# Patient Record
Sex: Male | Born: 1945
Health system: Southern US, Community
[De-identification: ages and names within clinical notes are randomized; demographics above are authoritative.]

## PROBLEM LIST (undated history)

## (undated) DIAGNOSIS — I4891 Unspecified atrial fibrillation: Secondary | ICD-10-CM

## (undated) DIAGNOSIS — Z7901 Long term (current) use of anticoagulants: Secondary | ICD-10-CM

## (undated) DIAGNOSIS — I350 Nonrheumatic aortic (valve) stenosis: Secondary | ICD-10-CM

## (undated) DIAGNOSIS — I10 Essential (primary) hypertension: Secondary | ICD-10-CM

## (undated) DIAGNOSIS — R011 Cardiac murmur, unspecified: Secondary | ICD-10-CM

## (undated) DIAGNOSIS — E785 Hyperlipidemia, unspecified: Secondary | ICD-10-CM

## (undated) DIAGNOSIS — C419 Malignant neoplasm of bone and articular cartilage, unspecified: Secondary | ICD-10-CM

## (undated) DIAGNOSIS — R9389 Abnormal findings on diagnostic imaging of other specified body structures: Secondary | ICD-10-CM

## (undated) DIAGNOSIS — J302 Other seasonal allergic rhinitis: Secondary | ICD-10-CM

## (undated) DIAGNOSIS — N529 Male erectile dysfunction, unspecified: Secondary | ICD-10-CM

## (undated) DIAGNOSIS — I341 Nonrheumatic mitral (valve) prolapse: Secondary | ICD-10-CM

## (undated) DIAGNOSIS — I480 Paroxysmal atrial fibrillation: Secondary | ICD-10-CM

## (undated) DIAGNOSIS — I359 Nonrheumatic aortic valve disorder, unspecified: Secondary | ICD-10-CM

## (undated) DIAGNOSIS — E7800 Pure hypercholesterolemia, unspecified: Secondary | ICD-10-CM

## (undated) HISTORY — DX: Long term (current) use of anticoagulants: Z79.01

## (undated) HISTORY — DX: Malignant neoplasm of bone and articular cartilage, unspecified: C41.9

## (undated) HISTORY — DX: Nonrheumatic aortic valve disorder, unspecified: I35.9

## (undated) HISTORY — DX: Essential (primary) hypertension: I10

## (undated) HISTORY — DX: Male erectile dysfunction, unspecified: N52.9

## (undated) HISTORY — DX: Cardiac murmur, unspecified: R01.1

## (undated) HISTORY — DX: Unspecified atrial fibrillation: I48.91

## (undated) HISTORY — DX: Hyperlipidemia, unspecified: E78.5

## (undated) HISTORY — DX: Paroxysmal atrial fibrillation: I48.0

## (undated) HISTORY — DX: Abnormal findings on diagnostic imaging of other specified body structures: R93.89

## (undated) HISTORY — DX: Nonrheumatic mitral (valve) prolapse: I34.1

## (undated) HISTORY — DX: Other seasonal allergic rhinitis: J30.2

## (undated) HISTORY — DX: Pure hypercholesterolemia, unspecified: E78.00

---

## 1974-07-12 DIAGNOSIS — C419 Malignant neoplasm of bone and articular cartilage, unspecified: Secondary | ICD-10-CM

## 1974-07-12 HISTORY — PX: MANDIBLE SURGERY: SHX707

## 1974-07-12 HISTORY — DX: Malignant neoplasm of bone and articular cartilage, unspecified: C41.9

## 1985-07-12 HISTORY — PX: MANDIBLE SURGERY: SHX707

## 2002-07-18 HISTORY — PX: TRANSTHORACIC ECHOCARDIOGRAM: SHX275

## 2006-07-12 HISTORY — PX: EYELID LACERATION REPAIR: SHX5333

## 2006-11-08 ENCOUNTER — Ambulatory Visit: Payer: Self-pay | Admitting: Internal Medicine

## 2006-11-21 ENCOUNTER — Encounter (INDEPENDENT_AMBULATORY_CARE_PROVIDER_SITE_OTHER): Payer: Self-pay | Admitting: Specialist

## 2006-11-21 ENCOUNTER — Ambulatory Visit: Payer: Self-pay | Admitting: Internal Medicine

## 2010-11-24 NOTE — Assessment & Plan Note (Signed)
[facility]                         GASTROENTEROLOGY OFFICE NOTE   NAME:Brian Klein, Brian Klein                        MRN:          [id number]  DATE:11/08/2006                            DOB:          [birth year]    REFERRING PHYSICIAN:  Loraine Leriche A. Perini, M.D.   REASON FOR CONSULTATION:  Iron deficiency.   HISTORY:  This is a pleasant 65 year old white male with a remote  history of osteogenic sarcoma of the jaw bone who was referred through  the courtesy of Dr. Waynard Edwards regarding iron deficiency.  The patient was  evaluated in this facility on one occasion in July of 2003 when he  underwent complete colonoscopy for the purposes of screening.  He was  found to have sigmoid diverticulosis, as well as several ascending colon  polyps which were removed.  There was no evidence of adenomatous change.  Follow up in 5 years recommended.  The patient undergoes routine blood  work with Dr. Waynard Edwards as part of his regular checkups.  Recently, he may  have been a bit anemic, though I do not have a hemoglobin.  He did  undergo iron studies, which revealed a low ferritin at 23.7.  Iron  saturation was normal at 37%.  His B-12 level, as well as his folate  levels, were normal.  He is now referred.  The patient denies any active  GI symptoms.  He states he has had stool testing for occult blood, which  has been negative.  He denies melena or hematochezia specifically.  He  does take a baby aspirin per day, but no nonsteroidal anti-inflammatory  drugs.  He denies a history of blood donation in recent decades.  He is  accompanied by his wife, who seems to think he has a history of  borderline low iron.   PAST MEDICAL HISTORY:  1. Osteogenic sarcoma of the jaw bone in 1976.  The patient underwent      surgery, as well as adjuvant treatment.  Subsequent reconstruction      of the jawbone in 1980.  2. Hyperlipidemia.  3. Aortic stenosis, being observed.   ALLERGIES:  No known  drug allergies.   CURRENT MEDICATIONS:  1. Vytorin 40 mg daily.  2. Niacin 500 mg daily.  3. Aspirin 81 mg daily.  4. Iron sulfate 37.5 mg daily (recently started).  5. Fish oil 2 gm daily.   FAMILY HISTORY:  No family history of gastrointestinal malignancy.   SOCIAL HISTORY:  The patient is married with 2 sons.  He is accompanied  by his wife.  He is a retired Advertising copywriter.  He  received his Master's degree at [employer]  He does not smoke or use alcohol.   REVIEW OF SYSTEMS:  Per diagnostic evaluation form.   PHYSICAL EXAMINATION:  GENERAL:  A well-appearing male in no acute  distress.  VITAL SIGNS:  His blood pressure is 108/66, heart rate is 68 and  regular, weight is 159.8 pounds.  He is 5 feet, 7.5 inches in height.  HEENT:  Sclerae are anicteric.  Conjunctivae are quite pink.  Oral  mucosa is intact.  No adenopathy.  Deformity of the right jaw area from  prior surgery.  LUNGS:  Clear.  HEART:  Regular.  ABDOMEN:  Soft with a small ventral hernia.  Good bowel sounds heard.  No organomegaly.  EXTREMITIES:  Without edema.   IMPRESSION:  This is a 65 year old gentleman with iron-deficiency  anemia.  Negative GI review of systems.  Colonoscopy 5 years ago as  discussed above.   RECOMMENDATIONS:  Colonoscopy and upper endoscopy to exclude  gastrointestinal mucosal lesions to account for iron-deficiency.  The  nature of the procedures, as well as the risks, benefits, and  alternatives were reviewed.  He understood and agreed to proceed.  Also  consider biopsies of the small bowel mucosa to exclude sprue.     Wilhemina Bonito. Marina Goodell, MD  Electronically Signed    JNP/MedQ  DD: 11/08/2006  DT: 11/08/2006  Job #: [id number]   cc:   Loraine Leriche A. Perini, M.D.

## 2010-11-30 ENCOUNTER — Telehealth: Payer: Self-pay | Admitting: Cardiology

## 2010-11-30 NOTE — Telephone Encounter (Signed)
FYI- PATIENT CALLED WANTED TO KNOW ABOUT HIS APPOINTMENT 1 YROV. I SCHEDULED IT FOR June 6. [facility] HE ALSO NEEDS A ECHO.

## 2010-12-01 ENCOUNTER — Other Ambulatory Visit: Payer: Self-pay | Admitting: *Deleted

## 2010-12-01 DIAGNOSIS — I351 Nonrheumatic aortic (valve) insufficiency: Secondary | ICD-10-CM

## 2010-12-01 DIAGNOSIS — I350 Nonrheumatic aortic (valve) stenosis: Secondary | ICD-10-CM

## 2010-12-01 NOTE — Telephone Encounter (Signed)
Scheduled echo for 6/4

## 2010-12-14 ENCOUNTER — Ambulatory Visit (HOSPITAL_COMMUNITY): Payer: Medicare Other | Attending: Cardiology | Admitting: Radiology

## 2010-12-14 DIAGNOSIS — I359 Nonrheumatic aortic valve disorder, unspecified: Secondary | ICD-10-CM

## 2010-12-14 DIAGNOSIS — I350 Nonrheumatic aortic (valve) stenosis: Secondary | ICD-10-CM

## 2010-12-14 DIAGNOSIS — I379 Nonrheumatic pulmonary valve disorder, unspecified: Secondary | ICD-10-CM | POA: Insufficient documentation

## 2010-12-14 DIAGNOSIS — I079 Rheumatic tricuspid valve disease, unspecified: Secondary | ICD-10-CM | POA: Insufficient documentation

## 2010-12-14 DIAGNOSIS — I351 Nonrheumatic aortic (valve) insufficiency: Secondary | ICD-10-CM

## 2010-12-14 DIAGNOSIS — I080 Rheumatic disorders of both mitral and aortic valves: Secondary | ICD-10-CM | POA: Insufficient documentation

## 2010-12-15 ENCOUNTER — Encounter: Payer: Self-pay | Admitting: Cardiology

## 2010-12-16 ENCOUNTER — Ambulatory Visit (INDEPENDENT_AMBULATORY_CARE_PROVIDER_SITE_OTHER): Payer: Medicare Other | Admitting: Cardiology

## 2010-12-16 ENCOUNTER — Encounter: Payer: Self-pay | Admitting: Cardiology

## 2010-12-16 VITALS — BP 130/80 | HR 56 | Ht 67.0 in | Wt 155.4 lb

## 2010-12-16 DIAGNOSIS — E7800 Pure hypercholesterolemia, unspecified: Secondary | ICD-10-CM

## 2010-12-16 DIAGNOSIS — I359 Nonrheumatic aortic valve disorder, unspecified: Secondary | ICD-10-CM

## 2010-12-16 DIAGNOSIS — I350 Nonrheumatic aortic (valve) stenosis: Secondary | ICD-10-CM

## 2010-12-16 DIAGNOSIS — Q2381 Bicuspid aortic valve: Secondary | ICD-10-CM | POA: Insufficient documentation

## 2010-12-16 DIAGNOSIS — Q231 Congenital insufficiency of aortic valve: Secondary | ICD-10-CM | POA: Insufficient documentation

## 2010-12-16 HISTORY — DX: Pure hypercholesterolemia, unspecified: E78.00

## 2010-12-16 NOTE — Assessment & Plan Note (Signed)
The patient has a history of bicuspid aortic valve with mild aortic stenosis.  Since last visit he has not been experiencing any chest pain or shortness of breath.  He's had no dizziness or syncope.  He exercises regularly.  He goes to the gym for 5 times a week for an hour each time.  He does aerobic workout as well as works out on machines.

## 2010-12-16 NOTE — Assessment & Plan Note (Signed)
The patient has a history of hypercholesterolemia.  He is on Vytorin.  He has lipids are responding nicely to Vytorin according to the patient.  He has not had any side effects from her statin therapy.

## 2010-12-16 NOTE — Progress Notes (Signed)
Brian Klein Date of Birth:  28-Apr-1946 [facility] / [facility] [address]   [address] [place], [place]  [address] [phone]           Fax   [phone]  HPI: This pleasant 65 year old gentleman is seen for a one-year followup office visit.  He has a history of bicuspid aortic valve with mild aortic stenosis.  He has not been experiencing any cardinal symptoms of symptomatic aortic stenosis.  In particular he denies exertional chest pain, shortness of breath, or exertional dizziness or syncope.  [facility] He works out on a treadmill and the elliptical as well as using the machines.  He had a recent echocardiogram which showed that his Mean systolic gradient across the aortic valve was 11 mm mercury. This is consistent with mild aortic stenosis. Current Outpatient Prescriptions  Medication Sig Dispense Refill  . aspirin 81 MG tablet Take 81 mg by mouth every other day.        . Cholecalciferol (VITAMIN D) 2000 UNITS tablet Take 2,000 Units by mouth daily.        Marland Kitchen ezetimibe-simvastatin (VYTORIN) 10-40 MG per tablet Take 1 tablet by mouth at bedtime.        . fexofenadine (ALLEGRA) 60 MG tablet Take 60 mg by mouth as needed.        . fish oil-omega-3 fatty acids 1000 MG capsule Take by mouth daily.        . IRON PO Take by mouth once a week.        . niacin (NIASPAN) 500 MG CR tablet Take 500 mg by mouth every other day.          No Known Allergies  Patient Active Problem List  Diagnoses  . Bicuspid aortic valve  . Hypercholesterolemia    History  Smoking status  . Never Smoker   Smokeless tobacco  . Never Used    History  Alcohol Use No    Family History  Problem Relation Age of Onset  . Stroke Mother   . Heart attack Father   . Hypertension Father   . Hypertension Sister     Review of Systems: The patient denies any heat or cold intolerance.  No weight gain or weight loss.  The patient denies headaches or  blurry vision.  There is no cough or sputum production.  The patient denies dizziness.  There is no hematuria or hematochezia.  The patient denies any muscle aches or arthritis.  The patient denies any rash.  The patient denies frequent falling or instability.  There is no history of depression or anxiety.  All other systems were reviewed and are negative.   Physical Exam: Filed Vitals:   12/16/10 1344  BP: 130/80  Pulse: 56  The general appearance reveals a well-developed well-nourished gentleman in no distress.Pupils equal and reactive.   Extraocular Movements are full.  There is no scleral icterus.  The mouth and pharynx are normal.  The neck is supple.  The carotids reveal no bruits.  The jugular venous pressure is normal.  The thyroid is not enlarged.  There is no lymphadenopathy.  The patient has had previous reconstructive surgery on his jaw.The chest is clear to percussion and auscultation. There are no rales or rhonchi. Expansion of the chest is symmetrical.  The heart reveals a grade 2/6 systolic ejection murmur at the base.  No diastolic murmur.  No gallop or rub.The abdomen is soft and  nontender. Bowel sounds are normal. The liver and spleen are not enlarged. There Are no abdominal masses. There are no bruits.  Normal extremity no phlebitis or edema.Strength is normal and symmetrical in all extremities.  There is no lateralizing weakness.  There are no sensory deficits.  EKG today shows sinus bradycardia and is otherwise normal    Assessment / Plan: Continue present meds.  No new meds were prescribed.  Recheck in one year for office visit and EKG.

## 2011-11-04 ENCOUNTER — Encounter: Payer: Self-pay | Admitting: Internal Medicine

## 2011-11-16 ENCOUNTER — Encounter: Payer: Self-pay | Admitting: Internal Medicine

## 2011-12-17 ENCOUNTER — Telehealth: Payer: Self-pay | Admitting: Internal Medicine

## 2011-12-17 ENCOUNTER — Telehealth: Payer: Self-pay | Admitting: *Deleted

## 2011-12-17 ENCOUNTER — Encounter: Payer: Self-pay | Admitting: Cardiology

## 2011-12-17 ENCOUNTER — Ambulatory Visit (INDEPENDENT_AMBULATORY_CARE_PROVIDER_SITE_OTHER): Payer: Medicare Other | Admitting: Cardiology

## 2011-12-17 VITALS — BP 114/78 | HR 70 | Ht 67.0 in | Wt 154.0 lb

## 2011-12-17 DIAGNOSIS — I359 Nonrheumatic aortic valve disorder, unspecified: Secondary | ICD-10-CM

## 2011-12-17 DIAGNOSIS — E7800 Pure hypercholesterolemia, unspecified: Secondary | ICD-10-CM

## 2011-12-17 DIAGNOSIS — I4821 Permanent atrial fibrillation: Secondary | ICD-10-CM | POA: Insufficient documentation

## 2011-12-17 DIAGNOSIS — I4891 Unspecified atrial fibrillation: Secondary | ICD-10-CM

## 2011-12-17 DIAGNOSIS — Q231 Congenital insufficiency of aortic valve: Secondary | ICD-10-CM

## 2011-12-17 LAB — HEPATIC FUNCTION PANEL
ALT: 20 U/L (ref 0–53)
AST: 25 U/L (ref 0–37)
Albumin: 3.8 g/dL (ref 3.5–5.2)
Alkaline Phosphatase: 58 U/L (ref 39–117)
Total Protein: 6.3 g/dL (ref 6.0–8.3)

## 2011-12-17 LAB — CBC WITH DIFFERENTIAL/PLATELET
Basophils Absolute: 0 10*3/uL (ref 0.0–0.1)
HCT: 38.9 % — ABNORMAL LOW (ref 39.0–52.0)
Lymphs Abs: 2.1 10*3/uL (ref 0.7–4.0)
MCV: 83.2 fl (ref 78.0–100.0)
Monocytes Absolute: 0.4 10*3/uL (ref 0.1–1.0)
Neutrophils Relative %: 46.5 % (ref 43.0–77.0)
Platelets: 192 10*3/uL (ref 150.0–400.0)
RDW: 13.3 % (ref 11.5–14.6)
WBC: 5.1 10*3/uL (ref 4.5–10.5)

## 2011-12-17 LAB — BASIC METABOLIC PANEL
BUN: 16 mg/dL (ref 6–23)
Chloride: 106 mEq/L (ref 96–112)
Creatinine, Ser: 1 mg/dL (ref 0.4–1.5)
GFR: 80.75 mL/min (ref >60.00)
Glucose, Bld: 94 mg/dL (ref 70–99)
Potassium: 4.1 mEq/L (ref 3.5–5.1)

## 2011-12-17 LAB — TSH: TSH: 2.28 u[IU]/mL (ref 0.35–5.50)

## 2011-12-17 MED ORDER — RIVAROXABAN 20 MG PO TABS: 20.0000 mg | ORAL_TABLET | Freq: Every day | ORAL | Status: DC

## 2011-12-17 NOTE — Telephone Encounter (Signed)
Pt was seen by his cardiologist today and placed on Xarelto for A-Fib. Pt has recall colon scheduled in July. Pt states his cardiologist instructed him to contact Dr. Marina Goodell to see if he wanted to cancel the colon or what he wanted to do. Dr. Marina Goodell please advise.

## 2011-12-17 NOTE — Assessment & Plan Note (Signed)
His last echocardiogram was done on 12/14/10, a year ago, and at that time showed only mild aortic stenosis with a peak aortic valve gradient of 19 and a mean gradient of 11

## 2011-12-17 NOTE — Patient Instructions (Signed)
Will obtain labs today and call you with the results  Your physician has requested that you have an echocardiogram. Echocardiography is a painless test that uses sound waves to create images of your heart. It provides your doctor with information about the size and shape of your heart and how well your heart's chambers and valves are working. This procedure takes approximately one hour. There are no restrictions for this procedure.  Your physician recommends that you schedule a follow-up appointment in: 3-4 weeks  STOP ASPIRIN  Will call you today with your labs and send Rx for Xarelto base on the results  Atrial Fibrillation Your caregiver has diagnosed you with atrial fibrillation (AFib). The heart normally beats very regularly; AFib is a type of irregular heartbeat. The heart rate may be faster or slower than normal. This can prevent your heart from pumping as well as it should. AFib can be constant (chronic) or intermittent (paroxysmal). CAUSES  Atrial fibrillation may be caused by:  Heart disease, including heart attack, coronary artery disease, heart failure, diseases of the heart valves, and others.   Blood clot in the lungs (pulmonary embolism).   Pneumonia or other infections.   Chronic lung disease.   Thyroid disease.   Toxins. These include alcohol, some medications (such as decongestant medications or diet pills), and caffeine.  In some people, no cause for AFib can be found. This is referred to as Lone Atrial Fibrillation. SYMPTOMS   Palpitations or a fluttering in your chest.   A vague sense of chest discomfort.   Shortness of breath.   Sudden onset of lightheadedness or weakness.  Sometimes, the first sign of AFib can be a complication of the condition. This could be a stroke or heart failure. DIAGNOSIS  Your description of your condition may make your caregiver suspicious of atrial fibrillation. Your caregiver will examine your pulse to determine if fibrillation  is present. An EKG (electrocardiogram) will confirm the diagnosis. Further testing may help determine what caused you to have atrial fibrillation. This may include chest x-ray, echocardiogram, blood tests, or CT scans. PREVENTION  If you have previously had atrial fibrillation, your caregiver may advise you to avoid substances known to cause the condition (such as stimulant medications, and possibly caffeine or alcohol). You may be advised to use medications to prevent recurrence. Proper treatment of any underlying condition is important to help prevent recurrence. PROGNOSIS  Atrial fibrillation does tend to become a chronic condition over time. It can cause significant complications (see below). Atrial fibrillation is not usually immediately life-threatening, but it can shorten your life expectancy. This seems to be worse in women. If you have lone atrial fibrillation and are under 52 years old, the risk of complications is very low, and life expectancy is not shortened. RISKS AND COMPLICATIONS  Complications of atrial fibrillation can include stroke, chest pain, and heart failure. Your caregiver will recommend treatments for the atrial fibrillation, as well as for any underlying conditions, to help minimize risk of complications. TREATMENT  Treatment for AFib is divided into several categories:  Treatment of any underlying condition.   Converting you out of AFib into a regular (sinus) rhythm.   Controlling rapid heart rate.   Prevention of blood clots and stroke.  Medications and procedures are available to convert your atrial fibrillation to sinus rhythm. However, recent studies have shown that this may not offer you any advantage, and cardiac experts are continuing research and debate on this topic. More important is controlling your rapid  heartbeat. The rapid heartbeat causes more symptoms, and places strain on your heart. Your caregiver will advise you on the use of medications that can  control your heart rate. Atrial fibrillation is a strong stroke risk. You can lessen this risk by taking blood thinning medications such as Coumadin (warfarin), or sometimes aspirin. These medications need close monitoring by your caregiver. Over-medication can cause bleeding. Too little medication may not protect against stroke. HOME CARE INSTRUCTIONS   If your caregiver prescribed medicine to make your heartbeat more normally, take as directed.   If blood thinners were prescribed by your caregiver, take EXACTLY as directed.   Perform blood tests EXACTLY as directed.   Quit smoking. Smoking increases your cardiac and lung (pulmonary) risks.   DO NOT drink alcohol.   DO NOT drink caffeinated drinks (e.g. coffee, soda, chocolate, and leaf teas). You may drink decaffeinated coffee, soda or tea.   If you are overweight, you should choose a reduced calorie diet to lose weight. Please see a registered dietitian if you need more information about healthy weight loss. DO NOT USE DIET PILLS as they may aggravate heart problems.   If you have other heart problems that are causing AFib, you may need to eat a low salt, fat, and cholesterol diet. Your caregiver will tell you if this is necessary.   Exercise every day to improve your physical fitness. Stay active unless advised otherwise.   If your caregiver has given you a follow-up appointment, it is very important to keep that appointment. Not keeping the appointment could result in heart failure or stroke. If there is any problem keeping the appointment, you must call back to this facility for assistance.  SEEK MEDICAL CARE IF:  You notice a change in the rate, rhythm or strength of your heartbeat.   You develop an infection or any other change in your overall health status.  SEEK IMMEDIATE MEDICAL CARE IF:   You develop chest pain, abdominal pain, sweating, weakness or feel sick to your stomach (nausea).   You develop shortness of breath.    You develop swollen feet and ankles.   You develop dizziness, numbness, or weakness of your face or limbs, or any change in vision or speech.  MAKE SURE YOU:   Understand these instructions.   Will watch your condition.   Will get help right away if you are not doing well or get worse.  Document Released: 06/28/2005 Document Revised: 06/17/2011 Document Reviewed: 01/31/2008 [facility] Patient Information 2012 [place], [place].

## 2011-12-17 NOTE — Assessment & Plan Note (Signed)
His EKG which had been ordered as a routine for today's one-year visit shows that he is in atrial fibrillation with a controlled ventricular response of 70.  The atrial fibrillation was a surprise.  Going back in his history it may have been present for about a month considering that is when his exercise stamina seemed to change.  He has been taking a daily aspirin.  He has had no TIA symptoms.  He has had no chest pain or exertional dyspnea.

## 2011-12-17 NOTE — Progress Notes (Signed)
Brian Klein Date of Birth:  05-29-1946 [facility] [address] [place], [place]  [address] [phone]         Fax   [phone]  History of Present Illness: This pleasant 66 year old gentleman is seen back for a one-year followup office visit.  He has a history of aortic valve disease with bicuspid aortic valve and mild aortic stenosis.  He also has a past history of high cholesterol.  Generally he has been feeling very well.  About a month ago he began to notice less stamina when he would try to go for an outside jogging .  When he would work out in the gym he noticed no real change in his stamina.  He has not been aware of his pulse rate being irregular.  He's had no chest pain.  He does not have any past history of arrhythmia or atrial fibrillation.  He did have seasonal allergies earlier this spring and had taken him over-the-counter medications briefly.  Current Outpatient Prescriptions  Medication Sig Dispense Refill  . aspirin 81 MG tablet Take 81 mg by mouth every other day.        . Cholecalciferol (VITAMIN D) 2000 UNITS tablet Take 2,000 Units by mouth daily.        . Coenzyme Q10 (CO Q 10) 100 MG CAPS Take by mouth daily.      Marland Kitchen ezetimibe-simvastatin (VYTORIN) 10-40 MG per tablet Take 1 tablet by mouth at bedtime.        . fexofenadine (ALLEGRA) 60 MG tablet Take 60 mg by mouth as needed.        . fish oil-omega-3 fatty acids 1000 MG capsule Take 1 g by mouth 2 (two) times daily.       . IRON PO Take by mouth once a week.          No Known Allergies  Patient Active Problem List  Diagnoses  . Bicuspid aortic valve  . Hypercholesterolemia  . Atrial fibrillation    History  Smoking status  . Never Smoker   Smokeless tobacco  . Never Used    History  Alcohol Use No    Family History  Problem Relation Age of Onset  . Stroke Mother   . Heart attack Father   . Hypertension Father   . Hypertension Sister     Review of  Systems: Constitutional: no fever chills diaphoresis or fatigue or change in weight.  Head and neck: no hearing loss, no epistaxis, no photophobia or visual disturbance. Respiratory: No cough, shortness of breath or wheezing. Cardiovascular: No chest pain peripheral edema, palpitations. Gastrointestinal: No abdominal distention, no abdominal pain, no change in bowel habits hematochezia or melena. Genitourinary: No dysuria, no frequency, no urgency, no nocturia. Musculoskeletal:No arthralgias, no back pain, no gait disturbance or myalgias. Neurological: No dizziness, no headaches, no numbness, no seizures, no syncope, no weakness, no tremors. Hematologic: No lymphadenopathy, no easy bruising. Psychiatric: No confusion, no hallucinations, no sleep disturbance.    Physical Exam: Filed Vitals:   12/17/11 0932  BP: 114/78  Pulse: 70   the general appearance reveals a well-developed well-nourished gentleman in no distress.  He has facial asymmetry secondary to removal of the right half of his jaw and 1976 for an osteogenic sarcoma with subsequent plastic surgery using 2 of his left ribs and part of his left iliac crest to create a new jaw bone.Pupils equal and reactive.   Extraocular Movements are full.  There is no  scleral icterus.  The mouth and pharynx are normal.  The neck is supple.  The carotids reveal no bruits.  The jugular venous pressure is normal.  The thyroid is not enlarged.  There is no lymphadenopathy.  The chest is clear to percussion and auscultation. There are no rales or rhonchi. Expansion of the chest is symmetrical.  The heart reveals a soft systolic ejection murmur at the base and the heart rhythm is irregularly irregular.The abdomen is soft and nontender. Bowel sounds are normal. The liver and spleen are not enlarged. There Are no abdominal masses. There are no bruits.  The pedal pulses are good.  There is no phlebitis or edema.  There is no cyanosis or clubbing. Strength  is normal and symmetrical in all extremities.  There is no lateralizing weakness.  There are no sensory deficits.  The skin is warm and dry.  There is no rash.  EKG shows atrial fibrillation which is new and his ventricular response  is controlled   Assessment / Plan: Atrial fibrillation probably present for the past month or so.  Plan: Start Xarelto 20 mg once daily with supper for anticoagulation for his atrial fib.  After he has been adequately anticoagulated for 3-4 weeks we will consider outpatient cardioversion.  He will stop his daily aspirin while starting Xarelto.  He will return for an echocardiogram.  We checked CBC TSH basal metabolic panel and hepatic function panel today all of which were normal.

## 2011-12-17 NOTE — Telephone Encounter (Signed)
His procedure reports are not in EPIC. Please pull his chart for my review. Also, you should cancel his pre-visit and tentative procedure dates and have him make an O.V. with me to discuss options.

## 2011-12-17 NOTE — Telephone Encounter (Signed)
Chart ordered for Dr. Marina Goodell. Pt scheduled to see Dr. Marina Goodell 01/19/12@2 :30pm. Appts cancelled for previsit and colon.

## 2011-12-17 NOTE — Assessment & Plan Note (Signed)
Patient has a history of hypercholesterolemia monitored by Dr. Waynard Edwards.  He has not been having any myalgias from the statin.

## 2011-12-17 NOTE — Telephone Encounter (Signed)
Advised of labs and will start Xarelto 20 mg daily

## 2011-12-17 NOTE — Telephone Encounter (Signed)
Message copied by Burnell Blanks on Fri Dec 17, 2011  1:58 PM ------      Message from: Cassell Clement      Created: Fri Dec 17, 2011  1:42 PM       Please report.  The blood work drawn today is all normal.  He is not anemic.  His kidney function and liver function is normal.  Okay to start several toe 20 mg each day with the evening meal.

## 2011-12-20 NOTE — Telephone Encounter (Signed)
Chart on Dr. Perry's desk for review. 

## 2011-12-21 ENCOUNTER — Other Ambulatory Visit (HOSPITAL_COMMUNITY): Payer: Self-pay | Admitting: Cardiology

## 2011-12-21 DIAGNOSIS — Q231 Congenital insufficiency of aortic valve: Secondary | ICD-10-CM

## 2011-12-22 ENCOUNTER — Ambulatory Visit (HOSPITAL_COMMUNITY): Payer: Medicare Other | Attending: Cardiovascular Disease | Admitting: Radiology

## 2011-12-22 DIAGNOSIS — Q231 Congenital insufficiency of aortic valve: Secondary | ICD-10-CM

## 2011-12-22 DIAGNOSIS — I4891 Unspecified atrial fibrillation: Secondary | ICD-10-CM | POA: Insufficient documentation

## 2011-12-22 DIAGNOSIS — I359 Nonrheumatic aortic valve disorder, unspecified: Secondary | ICD-10-CM | POA: Insufficient documentation

## 2011-12-22 DIAGNOSIS — E7800 Pure hypercholesterolemia, unspecified: Secondary | ICD-10-CM | POA: Insufficient documentation

## 2011-12-22 NOTE — Progress Notes (Signed)
Echocardiogram performed.  

## 2011-12-24 ENCOUNTER — Telehealth: Payer: Self-pay | Admitting: *Deleted

## 2011-12-24 NOTE — Telephone Encounter (Signed)
Message copied by Burnell Blanks on Fri Dec 24, 2011  6:28 PM ------      Message from: Cassell Clement      Created: Wed Dec 22, 2011  3:12 PM       Please report.  Echo is stable.  AS and AI are still mild. LA upper normal size and no clots seen.

## 2011-12-24 NOTE — Telephone Encounter (Signed)
Advised wife of results.  

## 2012-01-04 ENCOUNTER — Ambulatory Visit (INDEPENDENT_AMBULATORY_CARE_PROVIDER_SITE_OTHER): Payer: Medicare Other | Admitting: Cardiology

## 2012-01-04 ENCOUNTER — Encounter: Payer: Self-pay | Admitting: Cardiology

## 2012-01-04 VITALS — BP 122/74 | HR 65 | Ht 67.0 in | Wt 153.0 lb

## 2012-01-04 DIAGNOSIS — Q231 Congenital insufficiency of aortic valve: Secondary | ICD-10-CM

## 2012-01-04 DIAGNOSIS — I480 Paroxysmal atrial fibrillation: Secondary | ICD-10-CM

## 2012-01-04 DIAGNOSIS — I4891 Unspecified atrial fibrillation: Secondary | ICD-10-CM

## 2012-01-04 MED ORDER — METOPROLOL SUCCINATE ER 25 MG PO TB24: 25.0000 mg | ORAL_TABLET | Freq: Every day | ORAL | Status: DC

## 2012-01-04 NOTE — Assessment & Plan Note (Signed)
The patient has noted that some days his pulse is regular and other days it is irregular.  EKG today confirms that he is back in normal sinus rhythm but with frequent PACs.

## 2012-01-04 NOTE — Assessment & Plan Note (Signed)
His recent echocardiogram showed normal left ventricular function and showed no significant worsening of his known aortic stenosis and aortic insufficiency

## 2012-01-04 NOTE — Patient Instructions (Addendum)
Your physician has recommended you make the following change in your medication: Start Metoprolol XL 25mg  daily  Your physician recommends that you schedule a follow-up appointment in: 2 months with an ekg

## 2012-01-04 NOTE — Progress Notes (Signed)
Brian Klein Date of Birth:  [birth year] [facility] [address] [place], [place]  [address] [phone]  Fax   [phone]  HPI: This pleasant 66 year old gentleman is seen for a followup office visit.  He was last seen on 12/17/11 at which time he was found to be in atrial fibrillation.  He had noticed an irregular pulse and less stamina several weeks prior to that visit.  He has been on Xarelto 20 mg daily since 12/17/11.  He has not been experiencing any bleeding problems from the Xarelto  Current Outpatient Prescriptions  Medication Sig Dispense Refill  . Cholecalciferol (VITAMIN D) 2000 UNITS tablet Take 2,000 Units by mouth daily.        . Coenzyme Q10 (CO Q 10) 100 MG CAPS Take by mouth daily.      Marland Kitchen ezetimibe-simvastatin (VYTORIN) 10-40 MG per tablet Take 1 tablet by mouth at bedtime.        . fexofenadine (ALLEGRA) 60 MG tablet Take 60 mg by mouth as needed.        . fish oil-omega-3 fatty acids 1000 MG capsule Take 1 g by mouth 2 (two) times daily.       . Rivaroxaban (XARELTO) 20 MG TABS Take 20 mg by mouth daily.  30 tablet  5  . metoprolol succinate (TOPROL-XL) 25 MG 24 hr tablet Take 1 tablet (25 mg total) by mouth daily. Take with or immediately following a meal.  30 tablet  5    No Known Allergies  Patient Active Problem List  Diagnosis  . Bicuspid aortic valve  . Hypercholesterolemia  . Atrial fibrillation    History  Smoking status  . Never Smoker   Smokeless tobacco  . Never Used    History  Alcohol Use No    Family History  Problem Relation Age of Onset  . Stroke Mother   . Heart attack Father   . Hypertension Father   . Hypertension Sister     Review of Systems: The patient denies any heat or cold intolerance.  No weight gain or weight loss.  The patient denies headaches or blurry vision.  There is no cough or sputum production.  The patient denies dizziness.  There is no hematuria or hematochezia.  The patient denies  any muscle aches or arthritis.  The patient denies any rash.  The patient denies frequent falling or instability.  There is no history of depression or anxiety.  All other systems were reviewed and are negative.   Physical Exam: Filed Vitals:   01/04/12 1123  BP: 122/74  Pulse: 65   the general appearance reveals a well-developed well-nourished gentleman in no distress.Pupils equal and reactive.   Extraocular Movements are full.  There is no scleral icterus.  The mouth and pharynx are normal.  The neck is supple.  The carotids reveal no bruits.  The jugular venous pressure is normal.  The thyroid is not enlarged.  There is no lymphadenopathy.  There is evidence of a previous fracture surgery on his jaw.The chest is clear to percussion and auscultation. There are no rales or rhonchi. Expansion of the chest is symmetrical.  The heart reveals a grade 2/6 systolic ejection murmur at the base.  The pulse is irregular and EKG shows normal sinus rhythm with frequent PACs.The abdomen is soft and nontender. Bowel sounds are normal. The liver and spleen are not enlarged. There Are no abdominal masses. There are no bruits.  The pedal pulses are good.  There is no phlebitis or edema.  There is no cyanosis or clubbing.  EKG shows sinus rhythm with PACs and otherwise normal EKG     Assessment / Plan: The patient is to continue Xarelto for his paroxysmal atrial fibrillation.  I will and metoprolol succinate 25 mg one daily to try to decrease the frequency of his premature atrial beats and hold him in sinus rhythm.  He may increase activity as tolerated.  Recheck in 2 months for followup office visit and EKG

## 2012-01-19 ENCOUNTER — Ambulatory Visit (INDEPENDENT_AMBULATORY_CARE_PROVIDER_SITE_OTHER): Payer: Medicare Other | Admitting: Internal Medicine

## 2012-01-19 ENCOUNTER — Encounter: Payer: Self-pay | Admitting: Internal Medicine

## 2012-01-19 VITALS — BP 120/76 | HR 60 | Ht 68.0 in | Wt 152.0 lb

## 2012-01-19 DIAGNOSIS — D689 Coagulation defect, unspecified: Secondary | ICD-10-CM

## 2012-01-19 DIAGNOSIS — I4891 Unspecified atrial fibrillation: Secondary | ICD-10-CM

## 2012-01-19 DIAGNOSIS — Z1211 Encounter for screening for malignant neoplasm of colon: Secondary | ICD-10-CM

## 2012-01-19 DIAGNOSIS — Z86010 Personal history of colonic polyps: Secondary | ICD-10-CM

## 2012-01-19 NOTE — Patient Instructions (Addendum)
You are due for a routine colonoscopy.  Please continue to work with your cardiologist as you adjust to Xarelto and when he feels it is appropriate, call our office to schedule a pre-visit with a nurse for this procedure

## 2012-01-19 NOTE — Progress Notes (Signed)
HISTORY OF PRESENT ILLNESS:  Brian Klein is a 66 y.o. male with multiple medical problems as listed below. He was initially evaluated in April 2008 for iron deficiency anemia. See that dictation. He subsequently underwent colonoscopy and upper endoscopy on 11/21/2006. Colonoscopy revealed multiple colon polyps, mild diverticulosis, and internal hemorrhoids. Polyps are adenomatous. Followup in 5 years recommended. Upper endoscopy revealed an incidental esophageal stricture and gastritis. He was placed on PPI. He has not been seen since. Earlier in the year, he received a recall letter for colonoscopy. From a GI standpoint, he has been doing well. He is no longer taking PPI. He denies heartburn except for dietary indiscretion. GI review of systems is otherwise negative. Would work from the outside has been reviewed. 12/17/2011 revealed normal comprehensive metabolic panel and CBC with a hemoglobin of 13.0. Last month, while undergoing routine followup for his aortic stenosis, he was found to be in atrial fibrillation. Apparently asymptomatic. He was placed on metoprolol and Xarelto. He has followup with his cardiologist next month. Apparently his atrial fibrillation is paroxysmal. No history of stroke. He is accompanied by his wife. He states his other chronic medical problems are stable.  REVIEW OF SYSTEMS:  All non-GI ROS negative except for heart rhythm changes, sinus and allergy trouble  Past Medical History  Diagnosis Date  . AVD (aortic valve disease)     with bicupsid aortic valve  . MVP (mitral valve prolapse)   . Heart murmur   . Osteogenic sarcoma     jaw bone  . Hyperlipidemia   . Atrial fibrillation     Past Surgical History  Procedure Date  . Transthoracic echocardiogram 07/18/2002    ef 71%  . Mandible surgery 1976    right removal -- cancer  . Mandible surgery 1987    right reconstruction  . Eyelid laceration repair 2008    left and right    Social History Brian Klein  reports that he has never smoked. He has never used smokeless tobacco. He reports that he does not drink alcohol or use illicit drugs.  family history includes Heart attack in his father; Hypertension in his father and sister; and Stroke in his mother.  There is no history of Colon cancer.  No Known Allergies     PHYSICAL EXAMINATION: Vital signs: BP 120/76  Pulse 60  Ht 5\' 8"  (1.727 m)  Wt 152 lb (68.947 kg)  BMI 23.11 kg/m2  Constitutional: generally well-appearing, no acute distress Psychiatric: alert and oriented x3, cooperative Eyes: extraocular movements intact, anicteric, conjunctiva pink Mouth: distorted due to reconstruction of the right jaw.oral pharynx moist, no lesions Neck: supple no lymphadenopathy Cardiovascular: heart regular rate and rhythm with an occasional irregular the, 2/6 systolic murmur Lungs: clear to auscultation bilaterally Abdomen: soft, nontender, nondistended, no obvious ascites, no peritoneal signs, normal bowel sounds, no organomegaly Rectal:deferred until colonoscopy Extremities: no lower extremity edema bilaterally Skin: no lesions on visible extremities Neuro: No focal deficits.   ASSESSMENT:  #1. History of multiple adenomatous colon polyps may of 2008 #2. Recently diagnosed with atrial fibrillation. On Xarelto   PLAN:  #1. In terms of his surveillance colonoscopy, I would like him to see Dr. Patty Sermons in followup. At that time, it can be determined with her would be okay to hold his Xarelto for colonoscopy. When we get this confirmation, the patient can see our previsit nurse and set up colonoscopy. Movi prep will be prescribed.The nature of the procedure, as well as the  risks, benefits, and alternatives were carefully and thoroughly reviewed with the patient. Ample time for discussion and questions allowed. The patient understood, was satisfied, and agreed to proceed. Marland Kitchen

## 2012-01-24 ENCOUNTER — Encounter: Payer: Medicare Other | Admitting: Internal Medicine

## 2012-02-03 ENCOUNTER — Telehealth: Payer: Self-pay | Admitting: Cardiology

## 2012-02-03 NOTE — Telephone Encounter (Signed)
Pt calling re having a little chest pain last night, feels fine today, was told to call if that happened, pls call [phone]

## 2012-02-03 NOTE — Telephone Encounter (Signed)
Advised patient

## 2012-02-03 NOTE — Telephone Encounter (Signed)
The pain sounds noncardiac.  He does not have any history of ischemic heart disease.  I suspect that the pain is musculoskeletal and I would suggest a trial of Tylenol or ibuprofen.  Let us know if problems persist.

## 2012-02-03 NOTE — Telephone Encounter (Signed)
Pain only last few seconds and then goes away, left sided.  Was worse last night when he was lying down but when he sat up would go away.  Was hurting bad enough that he had a hard time sleeping.    Denies any shortness of breath or radiating pain.  Patient does not have NTG on hand.  Will forward to  Dr. Patty Sermons for review.

## 2012-03-06 ENCOUNTER — Ambulatory Visit (INDEPENDENT_AMBULATORY_CARE_PROVIDER_SITE_OTHER): Payer: Medicare Other | Admitting: Cardiology

## 2012-03-06 ENCOUNTER — Encounter: Payer: Self-pay | Admitting: Cardiology

## 2012-03-06 VITALS — BP 118/70 | HR 57 | Ht 67.0 in | Wt 153.0 lb

## 2012-03-06 DIAGNOSIS — I359 Nonrheumatic aortic valve disorder, unspecified: Secondary | ICD-10-CM

## 2012-03-06 DIAGNOSIS — Q231 Congenital insufficiency of aortic valve: Secondary | ICD-10-CM

## 2012-03-06 DIAGNOSIS — Q2381 Bicuspid aortic valve: Secondary | ICD-10-CM

## 2012-03-06 DIAGNOSIS — E7800 Pure hypercholesterolemia, unspecified: Secondary | ICD-10-CM

## 2012-03-06 DIAGNOSIS — I350 Nonrheumatic aortic (valve) stenosis: Secondary | ICD-10-CM

## 2012-03-06 DIAGNOSIS — I480 Paroxysmal atrial fibrillation: Secondary | ICD-10-CM

## 2012-03-06 DIAGNOSIS — I4891 Unspecified atrial fibrillation: Secondary | ICD-10-CM

## 2012-03-06 NOTE — Assessment & Plan Note (Signed)
The patient has a history of paroxysmal atrial fibrillation.  He states that about every 2 weeks his heart feels like it goes out of rhythm.  It will usually last about 1 day or possibly 2 days and then go back into rhythm.  He has not checked to see whether his blood pressure is lower when he is out of rhythm than when he is in rhythm.  He will start to notice that and keep a record.  He is not aware of any precipitating factor which causes him to go out of rhythm.  It is not clear as to whether he is actually back in atrial fibrillation or whether he just has frequent premature beats of some type.  We will plan to have him do a 30 day event monitor after he returns from his family vacation.

## 2012-03-06 NOTE — Progress Notes (Signed)
Brian Klein Date of Birth:  1946-04-21 [facility] [address] [place], [place]  [address] [phone]  Fax   [phone]  HPI: This pleasant 66 year old gentleman is seen for a followup office visit. He was  seen on 12/17/11 at which time he was found to be in atrial fibrillation. He had noticed an irregular pulse and less stamina several weeks prior to that visit. He has been on Xarelto 20 mg daily since 12/17/11. He has not been experiencing any bleeding problems from the Xarelto.  When we last saw him on 01/04/12 he was back in normal sinus rhythm.   Current Outpatient Prescriptions  Medication Sig Dispense Refill  . Cholecalciferol (VITAMIN D) 2000 UNITS tablet Take 2,000 Units by mouth daily.        . Coenzyme Q10 (CO Q 10) 100 MG CAPS Take by mouth daily.      Marland Kitchen ezetimibe-simvastatin (VYTORIN) 10-40 MG per tablet Take 1 tablet by mouth at bedtime.        . fexofenadine (ALLEGRA) 60 MG tablet Take 60 mg by mouth as needed.        . fish oil-omega-3 fatty acids 1000 MG capsule Take 1 g by mouth 2 (two) times daily.       . metoprolol succinate (TOPROL-XL) 25 MG 24 hr tablet Take 1 tablet (25 mg total) by mouth daily. Take with or immediately following a meal.  30 tablet  5  . Rivaroxaban (XARELTO) 20 MG TABS Take 20 mg by mouth daily.  30 tablet  5    No Known Allergies  Patient Active Problem List  Diagnosis  . Bicuspid aortic valve  . Hypercholesterolemia  . Atrial fibrillation    History  Smoking status  . Never Smoker   Smokeless tobacco  . Never Used    History  Alcohol Use No    Family History  Problem Relation Age of Onset  . Stroke Mother   . Heart attack Father   . Hypertension Father   . Hypertension Sister   . Colon cancer Neg Hx     Review of Systems: The patient denies any heat or cold intolerance.  No weight gain or weight loss.  The patient denies headaches or blurry vision.  There is no cough or sputum production.   The patient denies dizziness.  There is no hematuria or hematochezia.  The patient denies any muscle aches or arthritis.  The patient denies any rash.  The patient denies frequent falling or instability.  There is no history of depression or anxiety.  All other systems were reviewed and are negative.   Physical Exam: Filed Vitals:   03/06/12 1049  BP: 118/70  Pulse: 57   the general appearance reveals a well-developed well-nourished gentleman in no distress.The head and neck exam reveals pupils equal and reactive.  Extraocular movements are full.  There is no scleral icterus.  The mouth and pharynx are normal.  The neck is supple.  The carotids reveal no bruits.  The jugular venous pressure is normal.  The  thyroid is not enlarged.  There is no lymphadenopathy.  The chest is clear to percussion and auscultation.  There are no rales or rhonchi.  Expansion of the chest is symmetrical.  The precordium is quiet.  The first heart sound is normal.  The second heart sound is physiologically split.  There is no  gallop rub or click.  There is a grade 2/6 systolic ejection murmur at the base  There is no abnormal lift or heave.  The abdomen is soft and nontender.  The bowel sounds are normal.  The liver and spleen are not enlarged.  There are no abdominal masses.  There are no abdominal bruits.  Extremities reveal good pedal pulses.  There is no phlebitis or edema.  There is no cyanosis or clubbing.  Strength is normal and symmetrical in all extremities.  There is no lateralizing weakness.  There are no sensory deficits.  The skin is warm and dry.  There is no rash.   EKG today shows sinus bradycardia at 57 per minute and is otherwise within normal limits  Assessment / Plan:  Continue same medication.  Return for a 30 day event monitor after he has his family vacation in early September.  Recheck for followup office visit and EKG in 3 months.

## 2012-03-06 NOTE — Patient Instructions (Signed)
Your physician has recommended that you wear an event monitor. Event monitors are medical devices that record the heart's electrical activity. Doctors most often [place] these monitors to diagnose arrhythmias. Arrhythmias are problems with the speed or rhythm of the heartbeat. The monitor is a small, portable device. You can wear one while you do your normal daily activities. This is usually used to diagnose what is causing palpitations/syncope (passing out).  OK TO SCHEDULED WHEN YOU GET BACK FROM YOUR TRIP  Your physician recommends that you continue on your current medications as directed. Please refer to the Current Medication list given to you today.  Your physician recommends that you schedule a follow-up appointment in: 3 month ov/ekg

## 2012-03-06 NOTE — Assessment & Plan Note (Signed)
The patient is not having any symptoms from his aortic stenosis 

## 2012-03-24 ENCOUNTER — Encounter (INDEPENDENT_AMBULATORY_CARE_PROVIDER_SITE_OTHER): Payer: Medicare Other

## 2012-03-24 DIAGNOSIS — I4891 Unspecified atrial fibrillation: Secondary | ICD-10-CM

## 2012-04-28 ENCOUNTER — Telehealth: Payer: Self-pay | Admitting: *Deleted

## 2012-04-28 DIAGNOSIS — I4891 Unspecified atrial fibrillation: Secondary | ICD-10-CM

## 2012-04-28 NOTE — Telephone Encounter (Signed)
Per  Dr. Patty Sermons patient in atrial fib about 16% of the time, continue Xarelto.  Left message for patient to call back

## 2012-05-01 ENCOUNTER — Encounter: Payer: Self-pay | Admitting: Cardiology

## 2012-05-10 MED ORDER — RIVAROXABAN 20 MG PO TABS: 20.0000 mg | ORAL_TABLET | Freq: Every day | ORAL | Status: DC

## 2012-05-10 NOTE — Telephone Encounter (Signed)
Advised patient of monitor results  

## 2012-05-10 NOTE — Telephone Encounter (Signed)
Something was wrong with his phone that is why he is just returning your call

## 2012-06-05 ENCOUNTER — Encounter: Payer: Self-pay | Admitting: Cardiology

## 2012-06-05 ENCOUNTER — Ambulatory Visit (INDEPENDENT_AMBULATORY_CARE_PROVIDER_SITE_OTHER): Payer: Medicare Other | Admitting: Cardiology

## 2012-06-05 VITALS — BP 133/68 | HR 61 | Resp 18 | Ht 67.0 in | Wt 156.4 lb

## 2012-06-05 DIAGNOSIS — Q231 Congenital insufficiency of aortic valve: Secondary | ICD-10-CM

## 2012-06-05 DIAGNOSIS — I480 Paroxysmal atrial fibrillation: Secondary | ICD-10-CM

## 2012-06-05 DIAGNOSIS — I4891 Unspecified atrial fibrillation: Secondary | ICD-10-CM

## 2012-06-05 MED ORDER — RIVAROXABAN 20 MG PO TABS: 20.0000 mg | ORAL_TABLET | Freq: Every day | ORAL | Status: DC

## 2012-06-05 NOTE — Assessment & Plan Note (Signed)
The patient has not had any thromboembolic episodes.  He is not having any bleeding from the Xarelto.  He has agreed to be in the orbit atrial fibrillation research study

## 2012-06-05 NOTE — Progress Notes (Signed)
Brian Klein Date of Birth:  1945-09-09 [facility] [address] [place], [place]  [address] [phone]  Fax   [phone]  HPI: This pleasant 66 year old gentleman is seen for a followup office visit. He was seen on 12/17/11 at which time he was found to be in atrial fibrillation. He had noticed an irregular pulse and less stamina several weeks prior to that visit. He has been on Xarelto 20 mg daily since 12/17/11. He has not been experiencing any bleeding problems from the Xarelto. When we last saw him on 8/26/ 13 he was back in normal sinus rhythm.  He continues to have occasional spells of atrial fibrillation.  They often last about a day and a half.  He is on long-term Xarelto.  His episodes of atrial fibrillation do not cause any dyspnea or chest pain and in fact sometimes exercise seems to help put him back into normal sinus rhythm.  Current Outpatient Prescriptions  Medication Sig Dispense Refill  . Cholecalciferol (VITAMIN D) 2000 UNITS tablet Take 2,000 Units by mouth daily.        . Coenzyme Q10 (CO Q 10) 100 MG CAPS Take by mouth daily.      Marland Kitchen ezetimibe-simvastatin (VYTORIN) 10-40 MG per tablet Take 1 tablet by mouth at bedtime.        . fexofenadine (ALLEGRA) 60 MG tablet Take 60 mg by mouth as needed.        . fish oil-omega-3 fatty acids 1000 MG capsule Take 1 g by mouth 2 (two) times daily.       . metoprolol succinate (TOPROL-XL) 25 MG 24 hr tablet Take 1 tablet (25 mg total) by mouth daily. Take with or immediately following a meal.  30 tablet  5  . Rivaroxaban (XARELTO) 20 MG TABS Take 1 tablet (20 mg total) by mouth daily.  90 tablet  3  . [DISCONTINUED] Rivaroxaban (XARELTO) 20 MG TABS Take 1 tablet (20 mg total) by mouth daily.  90 tablet  3    No Known Allergies  Patient Active Problem List  Diagnosis  . Bicuspid aortic valve  . Hypercholesterolemia  . Atrial fibrillation    History  Smoking status  . Never Smoker   Smokeless  tobacco  . Never Used    History  Alcohol Use No    Family History  Problem Relation Age of Onset  . Stroke Mother   . Heart attack Father   . Hypertension Father   . Hypertension Sister   . Colon cancer Neg Hx     Review of Systems: The patient denies any heat or cold intolerance.  No weight gain or weight loss.  The patient denies headaches or blurry vision.  There is no cough or sputum production.  The patient denies dizziness.  There is no hematuria or hematochezia.  The patient denies any muscle aches or arthritis.  The patient denies any rash.  The patient denies frequent falling or instability.  There is no history of depression or anxiety.  All other systems were reviewed and are negative.   Physical Exam: Filed Vitals:   06/05/12 1037  BP: 133/68  Pulse: 61  Resp: 18   the general appearance reveals a well-developed well-nourished gentleman in no distress.The head and neck exam reveals pupils equal and reactive.  Extraocular movements are full.  There is no scleral icterus.  The mouth and pharynx are normal.  The neck is supple.  The carotids reveal no bruits.  The  jugular venous pressure is normal.  The  thyroid is not enlarged.  There is no lymphadenopathy.  The chest is clear to percussion and auscultation.  There are no rales or rhonchi.  Expansion of the chest is symmetrical.  The precordium is quiet.  The first heart sound is normal.  The second heart sound is physiologically split.  There is a grade 2/6 systolic ejection murmur at the base.  There is no abnormal lift or heave.  The abdomen is soft and nontender.  The bowel sounds are normal.  The liver and spleen are not enlarged.  There are no abdominal masses.  There are no abdominal bruits.  Extremities reveal good pedal pulses.  There is no phlebitis or edema.  There is no cyanosis or clubbing.  Strength is normal and symmetrical in all extremities.  There is no lateralizing weakness.  There are no sensory deficits.   The skin is warm and dry.  There is no rash.   EKG shows normal sinus rhythm and incomplete right bundle branch block   Assessment / Plan: Continue on same medication.  Recheck in 4 months for followup office visit.  He is scheduled to have a colonoscopy soon and he will omit his Xarelto for 48 hours prior to colonoscopy

## 2012-06-05 NOTE — Patient Instructions (Addendum)
Your physician recommends that you continue on your current medications as directed. Please refer to the Current Medication list given to you today.  Your physician recommends that you schedule a follow-up appointment in: 4 month   HOLD YOUR XARELTO FOR 48 HOURS PRIOR TO YOUR COLONOSCOPY

## 2012-06-05 NOTE — Assessment & Plan Note (Signed)
The patient is asymptomatic in regard to his bicuspid aortic valve

## 2012-06-19 ENCOUNTER — Telehealth: Payer: Self-pay | Admitting: Internal Medicine

## 2012-06-19 NOTE — Telephone Encounter (Signed)
Pt states he saw his cardiologist in Nov and was told he could hold his Xeralto 48 hours prior to his colon (this is in the OV note). Pt wants to know if he still needs an OV here prior to scheduling his colon. Dr. Marina Goodell please advise.

## 2012-06-19 NOTE — Telephone Encounter (Signed)
Pt aware and appt for previsit and colon scheduled with pt.

## 2012-06-19 NOTE — Telephone Encounter (Signed)
No need if he is cleared by cardiology (documented) and otherwise well

## 2012-06-27 ENCOUNTER — Other Ambulatory Visit: Payer: Self-pay | Admitting: *Deleted

## 2012-06-27 MED ORDER — METOPROLOL SUCCINATE ER 25 MG PO TB24: 25.0000 mg | ORAL_TABLET | Freq: Every day | ORAL | Status: DC

## 2012-07-18 ENCOUNTER — Ambulatory Visit (AMBULATORY_SURGERY_CENTER): Payer: Medicare Other

## 2012-07-18 VITALS — Ht 68.0 in | Wt 155.4 lb

## 2012-07-18 DIAGNOSIS — Z860100 Personal history of colon polyps, unspecified: Secondary | ICD-10-CM

## 2012-07-18 DIAGNOSIS — Z1211 Encounter for screening for malignant neoplasm of colon: Secondary | ICD-10-CM

## 2012-07-18 MED ORDER — MOVIPREP 100 G PO SOLR: ORAL | Status: DC

## 2012-07-25 ENCOUNTER — Encounter: Payer: Self-pay | Admitting: Internal Medicine

## 2012-07-25 ENCOUNTER — Ambulatory Visit (AMBULATORY_SURGERY_CENTER): Payer: Medicare Other | Admitting: Internal Medicine

## 2012-07-25 VITALS — BP 123/46 | HR 77 | Temp 97.8°F | Resp 33 | Ht 68.0 in | Wt 155.0 lb

## 2012-07-25 DIAGNOSIS — D126 Benign neoplasm of colon, unspecified: Secondary | ICD-10-CM

## 2012-07-25 DIAGNOSIS — Z1211 Encounter for screening for malignant neoplasm of colon: Secondary | ICD-10-CM

## 2012-07-25 DIAGNOSIS — Z86010 Personal history of colonic polyps: Secondary | ICD-10-CM

## 2012-07-25 MED ORDER — SODIUM CHLORIDE 0.9 % IV SOLN: 500.0000 mL | INTRAVENOUS | Status: DC

## 2012-07-25 NOTE — Progress Notes (Signed)
Pt. Advised to resume Xarelto today.

## 2012-07-25 NOTE — Progress Notes (Signed)
Called to room to assist during endoscopic procedure.  Patient ID and intended procedure confirmed with present staff. Received instructions for my participation in the procedure from the performing physician.  

## 2012-07-25 NOTE — Progress Notes (Signed)
Patient did not experience any of the following events: a burn prior to discharge; a fall within the facility; wrong site/side/patient/procedure/implant event; or a hospital transfer or hospital admission upon discharge from the facility. (G8907) Patient did not have preoperative order for IV antibiotic SSI prophylaxis. (G8918)  

## 2012-07-25 NOTE — Op Note (Signed)
[facility] [address] [place], [address]   COLONOSCOPY PROCEDURE REPORT  PATIENT: Brian Klein, Brian Klein  MR#: [id number] BIRTHDATE: Nov 01, 1945 , 66  yrs. old GENDER: Male ENDOSCOPIST: Roxy Cedar, MD REFERRED XB:[id number] Program Recall PROCEDURE DATE:  07/25/2012 PROCEDURE:   Colonoscopy with snare polypectomy    x 4 ASA CLASS:   Class II INDICATIONS:Patient's personal history of adenomatous colon polyps. Last 11-2006 TAs MEDICATIONS: MAC sedation, administered by CRNA and propofol (Diprivan) 300mg  IV  DESCRIPTION OF PROCEDURE:   After the risks benefits and alternatives of the procedure were thoroughly explained, informed consent was obtained.  A digital rectal exam revealed no abnormalities of the rectum.   The LB [id number]  endoscope was introduced through the anus and advanced to the cecum, which was identified by both the appendix and ileocecal valve. No adverse events experienced.   The quality of the prep was excellent, using MoviPrep  The instrument was then slowly withdrawn as the colon was fully examined.      COLON FINDINGS: Four polyps ranging between 3-66mm in size were found at the cecum, in the transverse colon, and sigmoid colon.  A polypectomy was performed with a cold snare.  The resection was complete and the polyp tissue was completely retrieved.   The colon mucosa was otherwise normal.  Retroflexed views revealed internal hemorrhoids. The time to cecum=7 minutes 20 seconds.  Withdrawal time=15 minutes 09 seconds.  The scope was withdrawn and the procedure completed. COMPLICATIONS: There were no complications.  ENDOSCOPIC IMPRESSION: 1.   Four polyps ranging between 3-59mm in size were found at the cecum, in the transverse colon, and sigmoid colon; polypectomy was performed with a cold snare 2.   The colon mucosa was otherwise normal  RECOMMENDATIONS: 1. Follow up colonoscopy in 5 years   eSigned:  Roxy Cedar,  MD 07/25/2012 3:56 PM   cc: Rodrigo Ran, MD and The Patient   PATIENT NAME:  Brian Klein, Brian Klein MR#: [id number]

## 2012-07-25 NOTE — Progress Notes (Signed)
Propofol given over incremental dosages 

## 2012-07-25 NOTE — Patient Instructions (Addendum)
YOU HAD AN ENDOSCOPIC PROCEDURE TODAY AT THE [facility]: Refer to the procedure report that was given to you for any specific questions about what was found during the examination.  If the procedure report does not answer your questions, please call your gastroenterologist to clarify.  If you requested that your care partner not be given the details of your procedure findings, then the procedure report has been included in a sealed envelope for you to review at your convenience later.  YOU SHOULD EXPECT: Some feelings of bloating in the abdomen. Passage of more gas than usual.  Walking can help get rid of the air that was put into your GI tract during the procedure and reduce the bloating. If you had a lower endoscopy (such as a colonoscopy or flexible sigmoidoscopy) you may notice spotting of blood in your stool or on the toilet paper. If you underwent a bowel prep for your procedure, then you may not have a normal bowel movement for a few days.  DIET: Your first meal following the procedure should be a light meal and then it is ok to progress to your normal diet.  A half-sandwich or bowl of soup is an example of a good first meal.  Heavy or fried foods are harder to digest and may make you feel nauseous or bloated.  Likewise meals heavy in dairy and vegetables can cause extra gas to form and this can also increase the bloating.  Drink plenty of fluids but you should avoid alcoholic beverages for 24 hours.  ACTIVITY: Your care partner should take you home directly after the procedure.  You should plan to take it easy, moving slowly for the rest of the day.  You can resume normal activity the day after the procedure however you should NOT DRIVE or use heavy machinery for 24 hours (because of the sedation medicines used during the test).    SYMPTOMS TO REPORT IMMEDIATELY: A gastroenterologist can be reached at any hour.  During normal business hours, 8:30 AM to 5:00 PM Monday through Friday,  call [phone].  After hours and on weekends, please call the GI answering service at [phone] who will take a message and have the physician on call contact you.   Following lower endoscopy (colonoscopy or flexible sigmoidoscopy):  Excessive amounts of blood in the stool  Significant tenderness or worsening of abdominal pains  Swelling of the abdomen that is new, acute  Fever of 100F or higher    FOLLOW UP: If any biopsies were taken you will be contacted by phone or by letter within the next 1-3 weeks.  Call your gastroenterologist if you have not heard about the biopsies in 3 weeks.  Our staff will call the home number listed on your records the next business day following your procedure to check on you and address any questions or concerns that you may have at that time regarding the information given to you following your procedure. This is a courtesy call and so if there is no answer at the home number and we have not heard from you through the emergency physician on call, we will assume that you have returned to your regular daily activities without incident.  SIGNATURES/CONFIDENTIALITY: You and/or your care partner have signed paperwork which will be entered into your electronic medical record.  These signatures attest to the fact that that the information above on your After Visit Summary has been reviewed and is understood.  Full responsibility of the confidentiality   of this discharge information lies with you and/or your care-partner.  Polyp information given.  Next colonoscopy 5 years. 

## 2012-07-26 ENCOUNTER — Telehealth: Payer: Self-pay | Admitting: *Deleted

## 2012-07-26 NOTE — Telephone Encounter (Signed)
  Follow up Call-  Call back number 07/25/2012  Post procedure Call Back phone  # [phone]  Permission to leave phone message Yes    Spoke with wife; he is gone Patient questions:  Do you have a fever, pain , or abdominal swelling? no Pain Score  0 *  Have you tolerated food without any problems? yes  Have you been able to return to your normal activities? yes  Do you have any questions about your discharge instructions: Diet   no Medications  no Follow up visit  no  Do you have questions or concerns about your Care? no  Actions: * If pain score is 4 or above: No action needed, pain <4.

## 2012-07-31 ENCOUNTER — Encounter: Payer: Self-pay | Admitting: Internal Medicine

## 2012-10-04 ENCOUNTER — Telehealth: Payer: Self-pay | Admitting: *Deleted

## 2012-10-04 ENCOUNTER — Encounter: Payer: Self-pay | Admitting: Cardiology

## 2012-10-04 ENCOUNTER — Ambulatory Visit (INDEPENDENT_AMBULATORY_CARE_PROVIDER_SITE_OTHER): Payer: Medicare Other | Admitting: Cardiology

## 2012-10-04 VITALS — BP 110/78 | HR 56 | Ht 68.0 in | Wt 155.0 lb

## 2012-10-04 DIAGNOSIS — E7800 Pure hypercholesterolemia, unspecified: Secondary | ICD-10-CM

## 2012-10-04 DIAGNOSIS — I4891 Unspecified atrial fibrillation: Secondary | ICD-10-CM

## 2012-10-04 DIAGNOSIS — Z79899 Other long term (current) drug therapy: Secondary | ICD-10-CM

## 2012-10-04 DIAGNOSIS — Q231 Congenital insufficiency of aortic valve: Secondary | ICD-10-CM

## 2012-10-04 LAB — BASIC METABOLIC PANEL
BUN: 14 mg/dL (ref 6–23)
CO2: 27 mEq/L (ref 19–32)
Calcium: 9.5 mg/dL (ref 8.4–10.5)
GFR: 77.8 mL/min (ref >60.00)
Glucose, Bld: 97 mg/dL (ref 70–99)

## 2012-10-04 LAB — MAGNESIUM: Magnesium: 2 mg/dL (ref 1.5–2.5)

## 2012-10-04 NOTE — Progress Notes (Signed)
Quick Note:  Please report to patient. The recent labs are stable. Continue same medication and careful diet. Okay to start flecainide. ______

## 2012-10-04 NOTE — Assessment & Plan Note (Signed)
The patient has a history of hypercholesterolemia followed by his PCP.  He is not having any side effects from the Vytorin

## 2012-10-04 NOTE — Patient Instructions (Addendum)
Will obtain labs today and call you with the results (bmet/magesium)  Your physician recommends that you continue on your current medications as directed. Please refer to the Current Medication list given to you today.  Your physician recommends that you schedule a follow-up appointment in: 10/12/12 at 10:00

## 2012-10-04 NOTE — Progress Notes (Signed)
Brian Klein III Date of Birth:  Jul 23, 1945 [facility] [address] [place], [place]  [address] [phone]         Fax   [phone]  History of Present Illness: This pleasant 67 year old gentleman is seen for a followup office visit. He was seen on 12/17/11 at which time he was found to be in atrial fibrillation. He had noticed an irregular pulse and less stamina several weeks prior to that visit. He has been on Xarelto 20 mg daily since 12/17/11. He has not been experiencing any bleeding problems from the Xarelto. When we  saw him on 8/26/ 13 he was back in normal sinus rhythm.  When we last saw him 06/05/12 he was in normal sinus rhythm.  Today he is in atrial fibrillation with controlled ventricular response. He continues to have occasional spells of atrial fibrillation. They often last about a day and a half. He is on long-term Xarelto. His episodes of atrial fibrillation do not cause any dyspnea or chest pain and in fact sometimes exercise seems to help put him back into normal sinus rhythm.  Recently the episodes of atrial fibrillation seems to be lasting longer and possibly occurring more frequently.   Current Outpatient Prescriptions  Medication Sig Dispense Refill  . Cholecalciferol (VITAMIN D) 2000 UNITS tablet Take 2,000 Units by mouth daily.        . Coenzyme Q10 (CO Q 10) 100 MG CAPS Take by mouth daily.      Marland Kitchen ezetimibe-simvastatin (VYTORIN) 10-40 MG per tablet Take 1 tablet by mouth at bedtime.        . fexofenadine (ALLEGRA) 60 MG tablet Take 60 mg by mouth as needed.        . fish oil-omega-3 fatty acids 1000 MG capsule Take 1 g by mouth 2 (two) times daily.       . flecainide (TAMBOCOR) 50 MG tablet Take 50 mg by mouth 2 (two) times daily.      . metoprolol succinate (TOPROL-XL) 25 MG 24 hr tablet Take 1 tablet (25 mg total) by mouth daily. Take with or immediately following a meal.  30 tablet  5  . Rivaroxaban (XARELTO) 20 MG TABS Take 1 tablet (20 mg  total) by mouth daily.  90 tablet  3   No current facility-administered medications for this visit.    No Known Allergies  Patient Active Problem List  Diagnosis  . Bicuspid aortic valve  . Hypercholesterolemia  . Atrial fibrillation    History  Smoking status  . Never Smoker   Smokeless tobacco  . Never Used    History  Alcohol Use No    Family History  Problem Relation Age of Onset  . Stroke Mother   . Heart attack Father   . Hypertension Father   . Hypertension Sister   . Colon cancer Neg Hx     Review of Systems: Constitutional: no fever chills diaphoresis or fatigue or change in weight.  Head and neck: no hearing loss, no epistaxis, no photophobia or visual disturbance. Respiratory: No cough, shortness of breath or wheezing. Cardiovascular: No chest pain peripheral edema, palpitations. Gastrointestinal: No abdominal distention, no abdominal pain, no change in bowel habits hematochezia or melena. Genitourinary: No dysuria, no frequency, no urgency, no nocturia. Musculoskeletal:No arthralgias, no back pain, no gait disturbance or myalgias. Neurological: No dizziness, no headaches, no numbness, no seizures, no syncope, no weakness, no tremors. Hematologic: No lymphadenopathy, no easy bruising. Psychiatric: No confusion, no hallucinations, no  sleep disturbance.    Physical Exam: Filed Vitals:   10/04/12 1049  BP: 110/78  Pulse: 56   the general appearance reveals a well-developed well-nourished gentleman in no distress.The head and neck exam reveals pupils equal and reactive.  Extraocular movements are full.  There is no scleral icterus.  The mouth and pharynx are normal.  The neck is supple.  The carotids reveal no bruits.  The jugular venous pressure is normal.  The  thyroid is not enlarged.  There is no lymphadenopathy.  The chest is clear to percussion and auscultation.  There are no rales or rhonchi.  Expansion of the chest is symmetrical.  The precordium is  quiet.  The first heart sound is normal.  The second heart sound is physiologically split.  There is no  gallop rub or click.  There is a grade 2/6 systolic ejection murmur at the left sternal edge. There is no abnormal lift or heave.  The abdomen is soft and nontender.  The bowel sounds are normal.  The liver and spleen are not enlarged.  There are no abdominal masses.  There are no abdominal bruits.  Extremities reveal good pedal pulses.  There is no phlebitis or edema.  There is no cyanosis or clubbing.  Strength is normal and symmetrical in all extremities.  There is no lateralizing weakness.  There are no sensory deficits.  The skin is warm and dry.  There is no rash.  EKG today shows fine atrial fibrillation with a controlled ventricular response.  Assessment / Plan:  We are checking electrolytes today and if stable we will proceed with trial of flecainide 50 mg twice a day and recheck him for an office visit and EKG next week.

## 2012-10-04 NOTE — Telephone Encounter (Signed)
Message copied by Burnell Blanks on Wed Oct 04, 2012  4:24 PM ------      Message from: Cassell Clement      Created: Wed Oct 04, 2012  4:11 PM       Please report to patient.  The recent labs are stable. Continue same medication and careful diet.  Okay to start flecainide. ------

## 2012-10-04 NOTE — Telephone Encounter (Signed)
Advised wife of labs 

## 2012-10-04 NOTE — Assessment & Plan Note (Signed)
The patient tolerates his atrial fibrillation without specific adverse symptoms.  He is mildly aware of the irregularity of his pulse.  He recently saw Dr. Sampson Goon in [facility] who suggested that he might want to add flecainide 50 mg twice a day.  The patient and I spoke about that today and it would be reasonable to start flecainide to try to hold him in rhythm.  His last echocardiogram 12/22/11 showed an ejection fraction of 55-60% and he does not have any history of congestive heart failure despite the fact that he had Adriamycin therapy in 1977.

## 2012-10-04 NOTE — Assessment & Plan Note (Signed)
The patient is not having any symptoms from his bicuspid aortic valve.

## 2012-10-12 ENCOUNTER — Ambulatory Visit (INDEPENDENT_AMBULATORY_CARE_PROVIDER_SITE_OTHER): Payer: Medicare Other | Admitting: Cardiology

## 2012-10-12 ENCOUNTER — Encounter: Payer: Self-pay | Admitting: Cardiology

## 2012-10-12 VITALS — BP 116/74 | HR 56 | Ht 68.0 in | Wt 157.8 lb

## 2012-10-12 DIAGNOSIS — I4891 Unspecified atrial fibrillation: Secondary | ICD-10-CM

## 2012-10-12 NOTE — Patient Instructions (Addendum)
Your physician recommends that you continue on your current medications as directed. Please refer to the Current Medication list given to you today.  Your physician wants you to follow-up in: 3 month ov/ekg You will receive a reminder letter in the mail two months in advance. If you don't receive a letter, please call our office to schedule the follow-up appointment.  

## 2012-10-12 NOTE — Progress Notes (Signed)
Brian Klein Date of Birth:  27-Mar-1946 [facility] [address] [place], [place]  [address] [phone]  Fax   [phone]  HPI:   Brian Klein Date of Birth:  Sep 04, 1945 [facility] [address] [place], [place]  [address] [phone]  Fax   [phone]  HPI: This pleasant 67 year old gentleman is seen for a followup office visit. He was seen on 12/17/11 at which time he was found to be in atrial fibrillation. He had noticed an irregular pulse and less stamina several weeks prior to that visit. He has been on Xarelto 20 mg daily since 12/17/11. He has not been experiencing any bleeding problems from the Xarelto. When we saw him on 8/26/ 13 he was back in normal sinus rhythm. When we last saw him 06/05/12 he was in normal sinus rhythm. Today he is in atrial fibrillation with controlled ventricular response. He continues to have occasional spells of atrial fibrillation. They often last about a day and a half. He is on long-term Xarelto. His episodes of atrial fibrillation do not cause any dyspnea or chest pain and in fact sometimes exercise seems to help put him back into normal sinus rhythm. Recently the episodes of atrial fibrillation seems to be lasting longer and possibly occurring more frequently.  On his last visit one week ago he was begun on flecainide 50 mg twice a day.  He is tolerating it well.  She's had no recurrent atrial fibrillation since starting flecainide  Current Outpatient Prescriptions  Medication Sig Dispense Refill  . Cholecalciferol (VITAMIN D) 2000 UNITS tablet Take 2,000 Units by mouth daily.        . Coenzyme Q10 (CO Q 10) 100 MG CAPS Take by mouth daily.      Marland Kitchen ezetimibe-simvastatin (VYTORIN) 10-40 MG per tablet Take 1 tablet by mouth at bedtime.        . fexofenadine (ALLEGRA) 60 MG tablet Take 60 mg by mouth as needed.        . fish oil-omega-3 fatty acids 1000 MG capsule Take 1 g by mouth 2 (two) times  daily.       . flecainide (TAMBOCOR) 50 MG tablet Take 50 mg by mouth 2 (two) times daily.      . metoprolol succinate (TOPROL-XL) 25 MG 24 hr tablet Take 1 tablet (25 mg total) by mouth daily. Take with or immediately following a meal.  30 tablet  5  . Rivaroxaban (XARELTO) 20 MG TABS Take 1 tablet (20 mg total) by mouth daily.  90 tablet  3   No current facility-administered medications for this visit.    No Known Allergies  Patient Active Problem List  Diagnosis  . Bicuspid aortic valve  . Hypercholesterolemia  . Atrial fibrillation    History  Smoking status  . Never Smoker   Smokeless tobacco  . Never Used    History  Alcohol Use No    Family History  Problem Relation Age of Onset  . Stroke Mother   . Heart attack Father   . Hypertension Father   . Hypertension Sister   . Colon cancer Neg Hx     Review of Systems: The patient denies any heat or cold intolerance.  No weight gain or weight loss.  The patient denies headaches or blurry vision.  There is no cough or sputum production.  The patient denies dizziness.  There is no hematuria or hematochezia.  The patient denies any muscle aches or arthritis.  The patient denies any rash.  The patient denies frequent falling or instability.  There is no history of depression or anxiety.  All other systems were reviewed and are negative.   Physical Exam: Filed Vitals:   10/12/12 1009  BP: 116/74  Pulse: 56   the general appearance reveals a well-developed well-nourished gentleman in no distress.The head and neck exam reveals pupils equal and reactive.  Extraocular movements are full.  There is no scleral icterus.  The mouth and pharynx are normal.  The neck is supple.  The carotids reveal no bruits.  The jugular venous pressure is normal.  The  thyroid is not enlarged.  There is no lymphadenopathy.  The chest is clear to percussion and auscultation.  There are no rales or rhonchi.  Expansion of the chest is symmetrical.  The  precordium is quiet.  The first heart sound is normal.  The second heart sound is physiologically split.  There is no  gallop rub or click.  There is a grade 2/6 systolic ejection murmur at the base There is no abnormal lift or heave.  The abdomen is soft and nontender.  The bowel sounds are normal.  The liver and spleen are not enlarged.  There are no abdominal masses.  There are no abdominal bruits.  Extremities reveal good pedal pulses.  There is no phlebitis or edema.  There is no cyanosis or clubbing.  Strength is normal and symmetrical in all extremities.  There is no lateralizing weakness.  There are no sensory deficits.  The skin is warm and dry.  There is no rash.  EKG shows sinus bradycardia with occasional PACs.  The QTc interval is 409 and the PR interval is 200 ms    Assessment / Plan: Continue same medication.  Recheck in 3 months for followup office visit and EKG.    Current Outpatient Prescriptions  Medication Sig Dispense Refill  . Cholecalciferol (VITAMIN D) 2000 UNITS tablet Take 2,000 Units by mouth daily.        . Coenzyme Q10 (CO Q 10) 100 MG CAPS Take by mouth daily.      Marland Kitchen ezetimibe-simvastatin (VYTORIN) 10-40 MG per tablet Take 1 tablet by mouth at bedtime.        . fexofenadine (ALLEGRA) 60 MG tablet Take 60 mg by mouth as needed.        . fish oil-omega-3 fatty acids 1000 MG capsule Take 1 g by mouth 2 (two) times daily.       . flecainide (TAMBOCOR) 50 MG tablet Take 50 mg by mouth 2 (two) times daily.      . metoprolol succinate (TOPROL-XL) 25 MG 24 hr tablet Take 1 tablet (25 mg total) by mouth daily. Take with or immediately following a meal.  30 tablet  5  . Rivaroxaban (XARELTO) 20 MG TABS Take 1 tablet (20 mg total) by mouth daily.  90 tablet  3   No current facility-administered medications for this visit.    No Known Allergies  Patient Active Problem List  Diagnosis  . Bicuspid aortic valve  . Hypercholesterolemia  . Atrial fibrillation    History   Smoking status  . Never Smoker   Smokeless tobacco  . Never Used    History  Alcohol Use No    Family History  Problem Relation Age of Onset  . Stroke Mother   . Heart attack Father   . Hypertension Father   . Hypertension Sister   . Colon cancer Neg  Hx     Review of Systems: The patient denies any heat or cold intolerance.  No weight gain or weight loss.  The patient denies headaches or blurry vision.  There is no cough or sputum production.  The patient denies dizziness.  There is no hematuria or hematochezia.  The patient denies any muscle aches or arthritis.  The patient denies any rash.  The patient denies frequent falling or instability.  There is no history of depression or anxiety.  All other systems were reviewed and are negative.   Physical Exam: Filed Vitals:   10/12/12 1009  BP: 116/74  Pulse: 56      Assessment / Plan:

## 2012-12-11 ENCOUNTER — Other Ambulatory Visit: Payer: Self-pay | Admitting: *Deleted

## 2012-12-11 MED ORDER — METOPROLOL SUCCINATE ER 25 MG PO TB24: 25.0000 mg | ORAL_TABLET | Freq: Every day | ORAL | Status: DC

## 2013-01-10 ENCOUNTER — Ambulatory Visit (INDEPENDENT_AMBULATORY_CARE_PROVIDER_SITE_OTHER): Payer: Medicare Other | Admitting: Cardiology

## 2013-01-10 ENCOUNTER — Encounter: Payer: Self-pay | Admitting: Cardiology

## 2013-01-10 VITALS — BP 118/66 | HR 54 | Ht 68.0 in | Wt 153.0 lb

## 2013-01-10 DIAGNOSIS — N529 Male erectile dysfunction, unspecified: Secondary | ICD-10-CM

## 2013-01-10 DIAGNOSIS — Q231 Congenital insufficiency of aortic valve: Secondary | ICD-10-CM

## 2013-01-10 DIAGNOSIS — I4891 Unspecified atrial fibrillation: Secondary | ICD-10-CM

## 2013-01-10 DIAGNOSIS — I480 Paroxysmal atrial fibrillation: Secondary | ICD-10-CM

## 2013-01-10 HISTORY — DX: Male erectile dysfunction, unspecified: N52.9

## 2013-01-10 MED ORDER — SILDENAFIL CITRATE 50 MG PO TABS: 50.0000 mg | ORAL_TABLET | Freq: Every day | ORAL | Status: DC | PRN

## 2013-01-10 NOTE — Assessment & Plan Note (Signed)
He has been having occasional short episodes of palpitations.  They usually last less than a day.  He is on long-term anticoagulation and has not had any TIA symptoms

## 2013-01-10 NOTE — Progress Notes (Signed)
Brian Klein Date of Birth:  Jul 01, 1946 [facility] [address] [place], [place]  [address] [phone]  Fax   [phone]  HPI: This pleasant 67 year old gentleman is seen for a followup office visit. He was seen on 12/17/11 at which time he was found to be in atrial fibrillation. He had noticed an irregular pulse and less stamina several weeks prior to that visit. He has been on Xarelto 20 mg daily since 12/17/11. He has not been experiencing any bleeding problems from the Xarelto. When we saw him on 8/26/ 13 he was back in normal sinus rhythm. When we last saw him 06/05/12 he was in normal sinus rhythm. Today he is in atrial fibrillation with controlled ventricular response. He continues to have occasional spells of atrial fibrillation. They often last about a day and a half. He is on long-term Xarelto. His episodes of atrial fibrillation do not cause any dyspnea or chest pain and in fact sometimes exercise seems to help put him back into normal sinus rhythm.  Several months ago he was started on flecainide 50 mg twice a day.  Since then his frequency of palpitations has decreased.  Current Outpatient Prescriptions  Medication Sig Dispense Refill  . Cholecalciferol (VITAMIN D) 2000 UNITS tablet Take 2,000 Units by mouth daily.        . Coenzyme Q10 (CO Q 10) 100 MG CAPS Take by mouth daily.      Marland Kitchen ezetimibe-simvastatin (VYTORIN) 10-40 MG per tablet Take 1 tablet by mouth at bedtime.        . fexofenadine (ALLEGRA) 60 MG tablet Take 60 mg by mouth as needed.        . fish oil-omega-3 fatty acids 1000 MG capsule Take 1 g by mouth 2 (two) times daily.       . flecainide (TAMBOCOR) 50 MG tablet Take 50 mg by mouth 2 (two) times daily.      . metoprolol succinate (TOPROL-XL) 25 MG 24 hr tablet Take 1 tablet (25 mg total) by mouth daily. Take with or immediately following a meal.  30 tablet  5  . Rivaroxaban (XARELTO) 20 MG TABS Take 1 tablet (20 mg total) by mouth daily.   90 tablet  3  . sildenafil (VIAGRA) 50 MG tablet Take 1 tablet (50 mg total) by mouth daily as needed.  5 tablet  3   No current facility-administered medications for this visit.    No Known Allergies  Patient Active Problem List   Diagnosis Date Noted  . Erectile dysfunction 01/10/2013  . Atrial fibrillation 12/17/2011  . Bicuspid aortic valve 12/16/2010  . Hypercholesterolemia 12/16/2010    History  Smoking status  . Never Smoker   Smokeless tobacco  . Never Used    History  Alcohol Use No    Family History  Problem Relation Age of Onset  . Stroke Mother   . Heart attack Father   . Hypertension Father   . Hypertension Sister   . Colon cancer Neg Hx     Review of Systems: The patient denies any heat or cold intolerance.  No weight gain or weight loss.  The patient denies headaches or blurry vision.  There is no cough or sputum production.  The patient denies dizziness.  There is no hematuria or hematochezia.  The patient denies any muscle aches or arthritis.  The patient denies any rash.  The patient denies frequent falling or instability.  There is no history of depression or anxiety.  All other systems were reviewed and are negative.   Physical Exam: Filed Vitals:   01/10/13 1025  BP: 118/66  Pulse: 54   The general appearance reveals a well-developed well-nourished gentleman in no distress.The head and neck exam reveals pupils equal and reactive.  Extraocular movements are full.  There is no scleral icterus.  The mouth and pharynx are normal.  The neck is supple.  The carotids reveal no bruits.  The jugular venous pressure is normal.  The  thyroid is not enlarged.  There is no lymphadenopathy.  The chest is clear to percussion and auscultation.  There are no rales or rhonchi.  Expansion of the chest is symmetrical.  The precordium is quiet.  The first heart sound is normal.  The second heart sound is physiologically split.  There is grade 2/6 systolic ejection murmur  at the base radiating to the neck  There is no abnormal lift or heave.  The abdomen is soft and nontender.  The bowel sounds are normal.  The liver and spleen are not enlarged.  There are no abdominal masses.  There are no abdominal bruits.  Extremities reveal good pedal pulses.  There is no phlebitis or edema.  There is no cyanosis or clubbing.  Strength is normal and symmetrical in all extremities.  There is no lateralizing weakness.  There are no sensory deficits.  The skin is warm and dry.  There is no rash.  EKG confirms sinus bradycardia at 54 per minute otherwise normal EKG.  His QTc interval is 396  Assessment / Plan: Continue same medication.  Trial of Viagra. Return in 3 months for followup office visit and EKG and get a two-dimensional echocardiogram prior to next office visit to look at his aortic valve.

## 2013-01-10 NOTE — Assessment & Plan Note (Signed)
History of mild aortic stenosis.  Last echo was 12/22/11 showing peak gradient of 26 and a mean gradient of 16 across his aortic valve.

## 2013-01-10 NOTE — Patient Instructions (Addendum)
Rx has been sent to your pharmacy for Viagra 50 mg as directed  Continue other medications as you are currently doing  Your physician wants you to follow-up in: 3 month ov and echo a few days prior You will receive a reminder letter in the mail two months in advance. If you don't receive a letter, please call our office to schedule the follow-up appointment.   Your physician has requested that you have an echocardiogram. Echocardiography is a painless test that uses sound waves to create images of your heart. It provides your doctor with information about the size and shape of your heart and how well your heart's chambers and valves are working. This procedure takes approximately one hour. There are no restrictions for this procedure.

## 2013-01-10 NOTE — Assessment & Plan Note (Signed)
Patient has noted some symptoms of erectile dysfunction.  We will prescribe Viagra 50 mg to take as directed.  Initially he will try taking just one half tablet to see how well that works.

## 2013-02-14 ENCOUNTER — Other Ambulatory Visit: Payer: Self-pay

## 2013-03-20 ENCOUNTER — Ambulatory Visit (HOSPITAL_COMMUNITY): Payer: Medicare Other | Attending: Cardiology | Admitting: Radiology

## 2013-03-20 DIAGNOSIS — Q2381 Bicuspid aortic valve: Secondary | ICD-10-CM

## 2013-03-20 DIAGNOSIS — I359 Nonrheumatic aortic valve disorder, unspecified: Secondary | ICD-10-CM

## 2013-03-20 DIAGNOSIS — I080 Rheumatic disorders of both mitral and aortic valves: Secondary | ICD-10-CM | POA: Insufficient documentation

## 2013-03-20 DIAGNOSIS — I480 Paroxysmal atrial fibrillation: Secondary | ICD-10-CM

## 2013-03-20 DIAGNOSIS — R011 Cardiac murmur, unspecified: Secondary | ICD-10-CM | POA: Insufficient documentation

## 2013-03-20 DIAGNOSIS — I079 Rheumatic tricuspid valve disease, unspecified: Secondary | ICD-10-CM | POA: Insufficient documentation

## 2013-03-20 DIAGNOSIS — I4891 Unspecified atrial fibrillation: Secondary | ICD-10-CM

## 2013-03-20 DIAGNOSIS — I379 Nonrheumatic pulmonary valve disorder, unspecified: Secondary | ICD-10-CM | POA: Insufficient documentation

## 2013-03-20 DIAGNOSIS — Q231 Congenital insufficiency of aortic valve: Secondary | ICD-10-CM | POA: Insufficient documentation

## 2013-03-20 NOTE — Progress Notes (Signed)
Echocardiogram performed.  

## 2013-03-21 ENCOUNTER — Telehealth: Payer: Self-pay | Admitting: *Deleted

## 2013-03-21 NOTE — Telephone Encounter (Signed)
Message copied by Burnell Blanks on Wed Mar 21, 2013 10:57 AM ------      Message from: Cassell Clement      Created: Wed Mar 21, 2013  5:33 AM       Please report. The aortic stenosis is still mild.  LV systolic function normal. CSD ------

## 2013-03-21 NOTE — Telephone Encounter (Signed)
Advised patient of echo  

## 2013-04-12 ENCOUNTER — Encounter: Payer: Self-pay | Admitting: Cardiology

## 2013-04-12 ENCOUNTER — Ambulatory Visit (INDEPENDENT_AMBULATORY_CARE_PROVIDER_SITE_OTHER): Payer: Medicare Other | Admitting: Cardiology

## 2013-04-12 VITALS — BP 150/85 | HR 54 | Ht 68.0 in | Wt 155.0 lb

## 2013-04-12 DIAGNOSIS — I480 Paroxysmal atrial fibrillation: Secondary | ICD-10-CM

## 2013-04-12 DIAGNOSIS — N529 Male erectile dysfunction, unspecified: Secondary | ICD-10-CM

## 2013-04-12 DIAGNOSIS — I4891 Unspecified atrial fibrillation: Secondary | ICD-10-CM

## 2013-04-12 DIAGNOSIS — Q231 Congenital insufficiency of aortic valve: Secondary | ICD-10-CM

## 2013-04-12 NOTE — Assessment & Plan Note (Signed)
Patient reports that the Viagra has well without side effects.

## 2013-04-12 NOTE — Progress Notes (Signed)
Brian Klein Date of Birth:  December 25, 1945 [facility] [address] [place], [place]  [address] [phone]  Fax   [phone]  HPI: This pleasant 67 year old gentleman is seen for a followup office visit. He was seen on 12/17/11 at which time he was found to be in atrial fibrillation. He had noticed an irregular pulse and less stamina several weeks prior to that visit. He has been on Xarelto 20 mg daily since 12/17/11. He has not been experiencing any bleeding problems from the Xarelto.  The patient has had subsequent intermittent atrial fibrillation.  We subsequently added flecainide 50 mg twice a day and he has done well on that.  He has not been aware of any recurrent atrial fibrillation.  EKG today shows sinus bradycardia.  The patient has a history of bicuspid aortic valve.  Last echocardiogram 03/20/13 shows mild aortic stenosis and mild aortic insufficiency and normal LV function with ejection fraction 55-60%  The patient has a history of erectile dysfunction.  Current Outpatient Prescriptions  Medication Sig Dispense Refill  . Cholecalciferol (VITAMIN D) 2000 UNITS tablet Take 2,000 Units by mouth daily.        . Coenzyme Q10 (CO Q 10) 100 MG CAPS Take by mouth daily.      Marland Kitchen ezetimibe-simvastatin (VYTORIN) 10-40 MG per tablet Take 1 tablet by mouth at bedtime.        . fexofenadine (ALLEGRA) 60 MG tablet Take 60 mg by mouth as needed.        . fish oil-omega-3 fatty acids 1000 MG capsule Take 1 g by mouth 2 (two) times daily.       . flecainide (TAMBOCOR) 50 MG tablet Take 50 mg by mouth 2 (two) times daily.      . metoprolol succinate (TOPROL-XL) 25 MG 24 hr tablet Take 25 mg by mouth as directed. 1/2 TABLET DAILY      . Rivaroxaban (XARELTO) 20 MG TABS Take 1 tablet (20 mg total) by mouth daily.  90 tablet  3  . sildenafil (VIAGRA) 50 MG tablet Take 1 tablet (50 mg total) by mouth daily as needed.  5 tablet  3   No current facility-administered medications  for this visit.    No Known Allergies  Patient Active Problem List   Diagnosis Date Noted  . Erectile dysfunction 01/10/2013  . Atrial fibrillation 12/17/2011  . Bicuspid aortic valve 12/16/2010  . Hypercholesterolemia 12/16/2010    History  Smoking status  . Never Smoker   Smokeless tobacco  . Never Used    History  Alcohol Use No    Family History  Problem Relation Age of Onset  . Stroke Mother   . Heart attack Father   . Hypertension Father   . Hypertension Sister   . Colon cancer Neg Hx     Review of Systems: The patient denies any heat or cold intolerance.  No weight gain or weight loss.  The patient denies headaches or blurry vision.  There is no cough or sputum production.  The patient denies dizziness.  There is no hematuria or hematochezia.  The patient denies any muscle aches or arthritis.  The patient denies any rash.  The patient denies frequent falling or instability.  There is no history of depression or anxiety.  All other systems were reviewed and are negative.   Physical Exam: Filed Vitals:   04/12/13 0931  BP: 150/85  Pulse: 54   The general appearance reveals a well-developed  well-nourished gentleman in no distress.The head and neck exam reveals pupils equal and reactive.  Extraocular movements are full.  There is no scleral icterus.  The mouth and pharynx are normal.  The neck is supple.  The carotids reveal no bruits.  The jugular venous pressure is normal.  The  thyroid is not enlarged.  There is no lymphadenopathy.  The chest is clear to percussion and auscultation.  There are no rales or rhonchi.  Expansion of the chest is symmetrical.  The precordium is quiet.  The first heart sound is normal.  The second heart sound is physiologically split.  There is grade 2/6 systolic ejection murmur at the base radiating to the neck  There is no abnormal lift or heave.  The abdomen is soft and nontender.  The bowel sounds are normal.  The liver and spleen are  not enlarged.  There are no abdominal masses.  There are no abdominal bruits.  Extremities reveal good pedal pulses.  There is no phlebitis or edema.  There is no cyanosis or clubbing.  Strength is normal and symmetrical in all extremities.  There is no lateralizing weakness.  There are no sensory deficits.  The skin is warm and dry.  There is no rash.  EKG confirms sinus bradycardia at 54 per minute with QTC interval 422 ms  Assessment / Plan: Continue same medication except trial of reduced dose of Toprol 12.5 mg daily at his request. Recheck in 4 months for office visit and EKG. He will be getting his complete physical with lab work from Dr. Waynard Edwards in several months

## 2013-04-12 NOTE — Assessment & Plan Note (Signed)
The patient has mild aortic stenosis from his bicuspid aortic valve as well as mild aortic insufficiency.  He is not having any symptoms of angina or congestive heart failure.  His exercise tolerance remains good.  He walks for exercise and also helps his son in [employer] work several times a week

## 2013-04-12 NOTE — Assessment & Plan Note (Signed)
The patient has had no further episodes of atrial fibrillation clinically.  He is asking that we try reducing his beta blocker.  We will decrease his Toprol down to 12.5 mg daily.  If she begins to have arrhythmia we will go back to the previous dose of 25 mg daily.

## 2013-04-12 NOTE — Patient Instructions (Addendum)
DECREASE YOUR TOPROL (METOPROLOL) TO 12.5 MG DAILY  Your physician wants you to follow-up in: 4 MONTH OV/EKG You will receive a reminder letter in the mail two months in advance. If you don't receive a letter, please call our office to schedule the follow-up appointment.

## 2013-04-12 NOTE — Addendum Note (Signed)
Addended by: Regis Bill B on: 04/12/2013 11:26 AM   Modules accepted: Orders

## 2013-04-13 ENCOUNTER — Encounter: Payer: Self-pay | Admitting: Cardiology

## 2013-04-30 ENCOUNTER — Telehealth: Payer: Self-pay | Admitting: Cardiology

## 2013-04-30 MED ORDER — FLECAINIDE ACETATE 50 MG PO TABS: 50.0000 mg | ORAL_TABLET | Freq: Two times a day (BID) | ORAL | Status: DC

## 2013-04-30 NOTE — Telephone Encounter (Deleted)
error 

## 2013-04-30 NOTE — Telephone Encounter (Signed)
Patient called in for refill flecinide

## 2013-05-17 ENCOUNTER — Other Ambulatory Visit: Payer: Self-pay

## 2013-06-06 ENCOUNTER — Other Ambulatory Visit: Payer: Self-pay | Admitting: Cardiology

## 2013-06-10 ENCOUNTER — Other Ambulatory Visit: Payer: Self-pay | Admitting: Cardiology

## 2013-06-12 ENCOUNTER — Other Ambulatory Visit: Payer: Self-pay | Admitting: Cardiology

## 2013-08-13 ENCOUNTER — Encounter: Payer: Self-pay | Admitting: Cardiology

## 2013-08-13 ENCOUNTER — Ambulatory Visit (INDEPENDENT_AMBULATORY_CARE_PROVIDER_SITE_OTHER): Payer: Medicare Other | Admitting: Cardiology

## 2013-08-13 VITALS — BP 126/80 | HR 64 | Ht 68.0 in | Wt 161.0 lb

## 2013-08-13 DIAGNOSIS — E7800 Pure hypercholesterolemia, unspecified: Secondary | ICD-10-CM

## 2013-08-13 DIAGNOSIS — Q231 Congenital insufficiency of aortic valve: Secondary | ICD-10-CM

## 2013-08-13 DIAGNOSIS — I480 Paroxysmal atrial fibrillation: Secondary | ICD-10-CM

## 2013-08-13 DIAGNOSIS — I4891 Unspecified atrial fibrillation: Secondary | ICD-10-CM

## 2013-08-13 DIAGNOSIS — Q2381 Bicuspid aortic valve: Secondary | ICD-10-CM

## 2013-08-13 NOTE — Patient Instructions (Signed)
Your physician recommends that you continue on your current medications as directed. Please refer to the Current Medication list given to you today.  Your physician wants you to follow-up in: 4 month ov You will receive a reminder letter in the mail two months in advance. If you don't receive a letter, please call our office to schedule the follow-up appointment.  

## 2013-08-13 NOTE — Assessment & Plan Note (Signed)
The patient is not having any symptoms from his aortic stenosis.  No congestive heart failure.  No chest pain.  No palpitations dizziness or syncope.

## 2013-08-13 NOTE — Assessment & Plan Note (Signed)
The patient remains on Vytorin for his hypercholesterolemia.  His lipids are monitored by Dr. Joylene Draft.  The patient had a recent annual physical and his numbers are good.  He is not having any myalgias from the statin therapy.

## 2013-08-13 NOTE — Progress Notes (Signed)
Brian Klein Date of Birth:  09-27-1945 [address] [place], [place]  [address] [phone]  Fax   [phone]  HPI: This pleasant 68 year old gentleman is seen for a followup office visit. He was seen on 12/17/11 at which time he was found to be in atrial fibrillation. He had noticed an irregular pulse and less stamina several weeks prior to that visit. He has been on Xarelto 20 mg daily since 12/17/11. He has not been experiencing any bleeding problems from the Xarelto.  The patient has had subsequent intermittent atrial fibrillation.  We subsequently added flecainide 50 mg twice a day and he has done well on that.  He has not been aware of any recurrent atrial fibrillation.  EKG today shows sinus bradycardia.  The patient has a history of bicuspid aortic valve.  Last echocardiogram 03/20/13 shows mild aortic stenosis and mild aortic insufficiency and normal LV function with ejection fraction 55-60%  The patient has a history of erectile dysfunction.  Since last visit we have successfully reduced his dose of Toprol to just 12.5 mg daily. Current Outpatient Prescriptions  Medication Sig Dispense Refill  . Cholecalciferol (VITAMIN D) 2000 UNITS tablet Take 2,000 Units by mouth daily.        . Coenzyme Q10 (CO Q 10) 100 MG CAPS Take by mouth daily.      Marland Kitchen ezetimibe-simvastatin (VYTORIN) 10-40 MG per tablet Take 1 tablet by mouth at bedtime.        . fexofenadine (ALLEGRA) 60 MG tablet Take 60 mg by mouth as needed.        . fish oil-omega-3 fatty acids 1000 MG capsule Take 1 g by mouth 2 (two) times daily.       . flecainide (TAMBOCOR) 50 MG tablet Take 1 tablet (50 mg total) by mouth 2 (two) times daily.  180 tablet  1  . metoprolol succinate (TOPROL-XL) 25 MG 24 hr tablet TAKE 1/2 TABLET BY MOUTH DAILY  45 tablet  1  . sildenafil (VIAGRA) 50 MG tablet Take 1 tablet (50 mg total) by mouth daily as needed.  5 tablet  3  . XARELTO 20 MG TABS tablet TAKE 1 TABLET BY MOUTH  DAILY  90 tablet  1   No current facility-administered medications for this visit.    No Known Allergies  Patient Active Problem List   Diagnosis Date Noted  . Erectile dysfunction 01/10/2013  . Atrial fibrillation 12/17/2011  . Bicuspid aortic valve 12/16/2010  . Hypercholesterolemia 12/16/2010    History  Smoking status  . Never Smoker   Smokeless tobacco  . Never Used    History  Alcohol Use No    Family History  Problem Relation Age of Onset  . Stroke Mother   . Heart attack Father   . Hypertension Father   . Hypertension Sister   . Colon cancer Neg Hx     Review of Systems: The patient denies any heat or cold intolerance.  No weight gain or weight loss.  The patient denies headaches or blurry vision.  There is no cough or sputum production.  The patient denies dizziness.  There is no hematuria or hematochezia.  The patient denies any muscle aches or arthritis.  The patient denies any rash.  The patient denies frequent falling or instability.  There is no history of depression or anxiety.  All other systems were reviewed and are negative.   Physical Exam: Filed Vitals:   08/13/13 0920  BP: 126/80  Pulse: 64   The general appearance reveals a well-developed well-nourished gentleman in no distress.The head and neck exam reveals pupils equal and reactive.  Extraocular movements are full.  There is no scleral icterus.  The mouth and pharynx are normal.  The neck is supple.  The carotids reveal no bruits.  The jugular venous pressure is normal.  The  thyroid is not enlarged.  There is no lymphadenopathy.  The chest is clear to percussion and auscultation.  There are no rales or rhonchi.  Expansion of the chest is symmetrical.  The precordium is quiet.  The first heart sound is normal.  The second heart sound is physiologically split.  There is grade 2/6 systolic ejection murmur at the base radiating to the neck  There is no abnormal lift or heave.  The abdomen is soft and  nontender.  The bowel sounds are normal.  The liver and spleen are not enlarged.  There are no abdominal masses.  There are no abdominal bruits.  Extremities reveal good pedal pulses.  There is no phlebitis or edema.  There is no cyanosis or clubbing.  Strength is normal and symmetrical in all extremities.  There is no lateralizing weakness.  There are no sensory deficits.  The skin is warm and dry.  There is no rash.    Assessment / Plan: Continue same medication. Recheck in 4 months for office visit and EKG. His weight is up 6 pounds since last visit.  He will work harder on diet and exercise.

## 2013-08-13 NOTE — Assessment & Plan Note (Signed)
The patient has not had any recurrence of his atrial fibrillation.  He has done well on flecainide 50 mg twice a day.  He has not had any bleeding problems from the Xarelto.  He has not had any TIA symptoms.

## 2013-10-11 ENCOUNTER — Telehealth: Payer: Self-pay | Admitting: *Deleted

## 2013-10-11 DIAGNOSIS — Z792 Long term (current) use of antibiotics: Secondary | ICD-10-CM

## 2013-10-11 NOTE — Telephone Encounter (Signed)
Patient requests an antibiotic for an upcoming dental visit. He stated that Dr Mare Ferrari has never filled one for him. Please advise. Thanks, MI

## 2013-10-12 MED ORDER — AMOXICILLIN 500 MG PO TABS: ORAL_TABLET | ORAL | Status: DC

## 2013-10-12 NOTE — Telephone Encounter (Signed)
Okay to call in the amoxicillin

## 2013-10-12 NOTE — Telephone Encounter (Signed)
Advised patient and sent to pharmacy  

## 2013-10-12 NOTE — Telephone Encounter (Signed)
Patient states he takes Amoxicillin 500 mg 4 tablets 1 hour prior to dental procedure, scheduled for 10/30/13. He would like for  Dr. Mare Ferrari to call in for him. Will forward to  Dr. Mare Ferrari for review

## 2013-10-27 ENCOUNTER — Other Ambulatory Visit: Payer: Self-pay | Admitting: Cardiology

## 2013-11-16 ENCOUNTER — Ambulatory Visit (INDEPENDENT_AMBULATORY_CARE_PROVIDER_SITE_OTHER): Payer: Medicare Other | Admitting: Cardiology

## 2013-11-16 ENCOUNTER — Encounter: Payer: Self-pay | Admitting: Cardiology

## 2013-11-16 VITALS — BP 130/74 | HR 53 | Ht 68.0 in | Wt 160.0 lb

## 2013-11-16 DIAGNOSIS — I4891 Unspecified atrial fibrillation: Secondary | ICD-10-CM

## 2013-11-16 DIAGNOSIS — E7800 Pure hypercholesterolemia, unspecified: Secondary | ICD-10-CM

## 2013-11-16 DIAGNOSIS — Q231 Congenital insufficiency of aortic valve: Secondary | ICD-10-CM

## 2013-11-16 NOTE — Assessment & Plan Note (Signed)
No symptoms from his mild aortic valve disease.

## 2013-11-16 NOTE — Assessment & Plan Note (Signed)
Is not having a myalgias from his statin therapy.

## 2013-11-16 NOTE — Assessment & Plan Note (Signed)
No recurrence of atrial fibrillation on flecainide and Metoprolol.

## 2013-11-16 NOTE — Patient Instructions (Signed)
Your physician recommends that you continue on your current medications as directed. Please refer to the Current Medication list given to you today.  Your physician recommends that you schedule a follow-up appointment in: 4 month ov/ekg 

## 2013-11-16 NOTE — Progress Notes (Signed)
Brian Klein Date of Birth:  05/19/1946 [facility] [address] [place], [place]  [address] [phone]        Fax   [phone]   History of Present Illness: This pleasant 68 year old gentleman is seen for a followup office visit. He was seen on 12/17/11 at which time he was found to be in atrial fibrillation. He had noticed an irregular pulse and less stamina several weeks prior to that visit. He has been on Xarelto 20 mg daily since 12/17/11. He has not been experiencing any bleeding problems from the Xarelto. The patient has had subsequent intermittent atrial fibrillation. We subsequently added flecainide 50 mg twice a day and he has done well on that. He has not been aware of any recurrent atrial fibrillation.  He remains in normal sinus rhythm clinically.  The patient has a history of bicuspid aortic valve. Last echocardiogram 03/20/13 shows mild aortic stenosis and mild aortic insufficiency and normal LV function with ejection fraction 55-60%.  The patient has a history of erectile dysfunction. Since last visit we have successfully reduced his dose of Toprol to just 12.5 mg daily.   Current Outpatient Prescriptions  Medication Sig Dispense Refill  . amoxicillin (AMOXIL) 500 MG tablet 4 tablets 1 hour prior to procedure  4 tablet  2  . Cholecalciferol (VITAMIN D) 2000 UNITS tablet Take 2,000 Units by mouth daily.        . Coenzyme Q10 (CO Q 10) 100 MG CAPS Take by mouth daily.      Marland Kitchen ezetimibe-simvastatin (VYTORIN) 10-40 MG per tablet Take 1 tablet by mouth at bedtime.        . fexofenadine (ALLEGRA) 60 MG tablet Take 60 mg by mouth as needed.        . fish oil-omega-3 fatty acids 1000 MG capsule Take 1 g by mouth 2 (two) times daily.       . flecainide (TAMBOCOR) 50 MG tablet TAKE 1 TABLET BY MOUTH TWICE DAILY  180 tablet  2  . metoprolol succinate (TOPROL-XL) 25 MG 24 hr tablet TAKE 1/2 TABLET BY MOUTH DAILY  45 tablet  1  . sildenafil (VIAGRA) 50 MG tablet  Take 1 tablet (50 mg total) by mouth daily as needed.  5 tablet  3  . XARELTO 20 MG TABS tablet TAKE 1 TABLET BY MOUTH DAILY  90 tablet  1   No current facility-administered medications for this visit.    No Known Allergies  Patient Active Problem List   Diagnosis Date Noted  . Erectile dysfunction 01/10/2013  . Atrial fibrillation 12/17/2011  . Bicuspid aortic valve 12/16/2010  . Hypercholesterolemia 12/16/2010    History  Smoking status  . Never Smoker   Smokeless tobacco  . Never Used    History  Alcohol Use No    Family History  Problem Relation Age of Onset  . Stroke Mother   . Heart attack Father   . Hypertension Father   . Hypertension Sister   . Colon cancer Neg Hx     Review of Systems: Constitutional: no fever chills diaphoresis or fatigue or change in weight.  Head and neck: no hearing loss, no epistaxis, no photophobia or visual disturbance. Respiratory: No cough, shortness of breath or wheezing. Cardiovascular: No chest pain peripheral edema, palpitations. Gastrointestinal: No abdominal distention, no abdominal pain, no change in bowel habits hematochezia or melena. Genitourinary: No dysuria, no frequency, no urgency, no nocturia. Musculoskeletal:No arthralgias, no back pain, no  gait disturbance or myalgias. Neurological: No dizziness, no headaches, no numbness, no seizures, no syncope, no weakness, no tremors. Hematologic: No lymphadenopathy, no easy bruising. Psychiatric: No confusion, no hallucinations, no sleep disturbance.    Physical Exam: Filed Vitals:   11/16/13 0937  BP: 130/74  Pulse: 53   The general appearance reveals a well-developed well-nourished gentleman in no distress.The head and neck exam reveals pupils equal and reactive.  Extraocular movements are full.  There is no scleral icterus.  The mouth and pharynx are normal.  The neck is supple.  The carotids reveal no bruits.  The jugular venous pressure is normal.  The  thyroid is  not enlarged.  There is no lymphadenopathy.  The chest is clear to percussion and auscultation.  There are no rales or rhonchi.  Expansion of the chest is symmetrical.  The precordium is quiet.  The first heart sound is normal.  The second heart sound is physiologically split.  There is no gallop rub or click.  There is a grade 2/6 harsh systolic ejection murmur at the base.  No diastolic murmur.  There is no abnormal lift or heave.  The abdomen is soft and nontender.  The bowel sounds are normal.  The liver and spleen are not enlarged.  There are no abdominal masses.  There are no abdominal bruits.  Extremities reveal good pedal pulses.  There is no phlebitis or edema.  There is no cyanosis or clubbing.  Strength is normal and symmetrical in all extremities.  There is no lateralizing weakness.  There are no sensory deficits.  The skin is warm and dry.  There is no rash.    Assessment / Plan: 1. paroxysmal atrial fibrillation, currently in normal sinus rhythm on flecainide.  On Xarelto, no bleeding problems.  Renal function checked by PCP has been normal. 2. aortic valve disease with last echo 03/20/13 showing mild aortic stenosis and mild aortic insufficiency and an ejection fraction of 55-60%.   Plan: Continue same medication.  Recheck in 4 months for office visit and EKG

## 2013-11-19 ENCOUNTER — Telehealth: Payer: Self-pay | Admitting: Cardiology

## 2013-11-19 NOTE — Telephone Encounter (Signed)
Discussed with pharmacist, Ysidro Evert. No contraindication for flonase in regard to atrial fibrillation. Patient made aware.

## 2013-11-19 NOTE — Telephone Encounter (Signed)
New Message:  Pt wants to know if he can take flonase with his a-fib. Pt is requesting a call back from the nurse.

## 2013-12-17 ENCOUNTER — Ambulatory Visit: Payer: Medicare Other | Admitting: Cardiology

## 2013-12-23 ENCOUNTER — Other Ambulatory Visit: Payer: Self-pay | Admitting: Cardiology

## 2014-02-17 ENCOUNTER — Other Ambulatory Visit: Payer: Self-pay | Admitting: Cardiology

## 2014-03-19 ENCOUNTER — Other Ambulatory Visit: Payer: Self-pay | Admitting: Cardiology

## 2014-03-27 ENCOUNTER — Encounter: Payer: Self-pay | Admitting: Cardiology

## 2014-03-27 ENCOUNTER — Ambulatory Visit (INDEPENDENT_AMBULATORY_CARE_PROVIDER_SITE_OTHER): Payer: Medicare Other | Admitting: Cardiology

## 2014-03-27 VITALS — BP 120/70 | HR 57 | Ht 68.0 in | Wt 156.0 lb

## 2014-03-27 DIAGNOSIS — Q231 Congenital insufficiency of aortic valve: Secondary | ICD-10-CM

## 2014-03-27 DIAGNOSIS — I480 Paroxysmal atrial fibrillation: Secondary | ICD-10-CM

## 2014-03-27 DIAGNOSIS — E7800 Pure hypercholesterolemia, unspecified: Secondary | ICD-10-CM

## 2014-03-27 DIAGNOSIS — I4891 Unspecified atrial fibrillation: Secondary | ICD-10-CM

## 2014-03-27 NOTE — Progress Notes (Signed)
Brian Klein Date of Birth:  01-17-1946 [facility] [address] [place], [place]  [address] [phone]        Fax   [phone]   History of Present Illness: This pleasant 68 year old gentleman is seen for a followup office visit. He was seen on 12/17/11 at which time he was found to be in atrial fibrillation. He had noticed an irregular pulse and less stamina several weeks prior to that visit. He has been on Xarelto 20 mg daily since 12/17/11. He has not been experiencing any bleeding problems from the Xarelto. The patient has had subsequent intermittent atrial fibrillation. We subsequently added flecainide 50 mg twice a day and he has done well on that. He has not been aware of any recurrent atrial fibrillation.  He remains in normal sinus rhythm clinically.  He sometimes has difficulty with restlessness trying to sleep at night and thinks that he may be in atrial fibrillation at that time.  I advised him to try to take his pulse to see if it was irregular at that time.  The patient has a history of bicuspid aortic valve. Last echocardiogram 03/20/13 shows mild aortic stenosis and mild aortic insufficiency and normal LV function with ejection fraction 55-60%.  The patient has a history of erectile dysfunction. Since last visit we have successfully reduced his dose of Toprol to just 12.5 mg daily.   Current Outpatient Prescriptions  Medication Sig Dispense Refill  . amoxicillin (AMOXIL) 500 MG tablet Take 500 mg by mouth 2 (two) times daily. 4 tablets 1 hour prior DENTAL  to procedure      . Cholecalciferol (VITAMIN D) 2000 UNITS tablet Take 2,000 Units by mouth daily.        . Coenzyme Q10 (CO Q 10) 100 MG CAPS Take by mouth daily.      Marland Kitchen ezetimibe-simvastatin (VYTORIN) 10-40 MG per tablet Take 1 tablet by mouth at bedtime.        . fexofenadine (ALLEGRA) 60 MG tablet Take 60 mg by mouth as needed.        . fish oil-omega-3 fatty acids 1000 MG capsule Take 1 g by  mouth 2 (two) times daily.       . flecainide (TAMBOCOR) 50 MG tablet TAKE 1 TABLET BY MOUTH TWICE DAILY  180 tablet  2  . metoprolol succinate (TOPROL-XL) 25 MG 24 hr tablet TAKE 1/2 TABLET BY MOUTH DAILY  45 tablet  0  . sildenafil (VIAGRA) 50 MG tablet Take 1 tablet (50 mg total) by mouth daily as needed.  5 tablet  3  . XARELTO 20 MG TABS tablet TAKE 1 TABLET BY MOUTH EVERY DAY  90 tablet  0   No current facility-administered medications for this visit.    No Known Allergies  Patient Active Problem List   Diagnosis Date Noted  . Erectile dysfunction 01/10/2013  . Atrial fibrillation 12/17/2011  . Bicuspid aortic valve 12/16/2010  . Hypercholesterolemia 12/16/2010    History  Smoking status  . Never Smoker   Smokeless tobacco  . Never Used    History  Alcohol Use No    Family History  Problem Relation Age of Onset  . Stroke Mother   . Heart attack Father   . Hypertension Father   . Hypertension Sister   . Colon cancer Neg Hx     Review of Systems: Constitutional: no fever chills diaphoresis or fatigue or change in weight.  Head and neck: no  hearing loss, no epistaxis, no photophobia or visual disturbance. Respiratory: No cough, shortness of breath or wheezing. Cardiovascular: No chest pain peripheral edema, palpitations. Gastrointestinal: No abdominal distention, no abdominal pain, no change in bowel habits hematochezia or melena. Genitourinary: No dysuria, no frequency, no urgency, no nocturia. Musculoskeletal:No arthralgias, no back pain, no gait disturbance or myalgias. Neurological: No dizziness, no headaches, no numbness, no seizures, no syncope, no weakness, no tremors. Hematologic: No lymphadenopathy, no easy bruising. Psychiatric: No confusion, no hallucinations, no sleep disturbance.    Physical Exam: Filed Vitals:   03/27/14 0913  BP: 120/70  Pulse: 57   The general appearance reveals a well-developed well-nourished gentleman in no distress.The  head and neck exam reveals pupils equal and reactive.  Extraocular movements are full.  There is no scleral icterus.  The mouth and pharynx are normal.  The neck is supple.  The carotids reveal no bruits.  The jugular venous pressure is normal.  The  thyroid is not enlarged.  There is no lymphadenopathy.  The chest is clear to percussion and auscultation.  There are no rales or rhonchi.  Expansion of the chest is symmetrical.  The precordium is quiet.  The first heart sound is normal.  The second heart sound is physiologically split.  There is no gallop rub or click.  There is a grade 2/6 harsh systolic ejection murmur at the base.  No diastolic murmur.  There is no abnormal lift or heave.  The abdomen is soft and nontender.  The bowel sounds are normal.  The liver and spleen are not enlarged.  There are no abdominal masses.  There are no abdominal bruits.  Extremities reveal good pedal pulses.  There is no phlebitis or edema.  There is no cyanosis or clubbing.  Strength is normal and symmetrical in all extremities.  There is no lateralizing weakness.  There are no sensory deficits.  The skin is warm and dry.  There is no rash.  EKG today shows sinus bradycardia with occasional PACs.  Since last tracing of 04/12/13, no significant change.  Assessment / Plan: 1. paroxysmal atrial fibrillation, currently in normal sinus rhythm on flecainide.  On Xarelto, no bleeding problems.  Renal function checked by PCP has been normal. 2. aortic valve disease with last echo 03/20/13 showing mild aortic stenosis and mild aortic insufficiency and an ejection fraction of 55-60%.   Plan: Continue same medication.  Recheck in 4 months for office visit.  He would like to try jogging occasionally and this would be okay to try.

## 2014-03-27 NOTE — Assessment & Plan Note (Signed)
Followed by his PCP 

## 2014-03-27 NOTE — Assessment & Plan Note (Signed)
The patient has not had any TIA symptoms.  He is not having hemorrhage or bleeding problems from the Xarelto.  He thinks that he has been in normal sinus rhythm since last visit.  He wonders if his occasional nocturnal restlessness may represent atrial fibrillation.  He will try to count his pulse next time.

## 2014-03-27 NOTE — Patient Instructions (Signed)
Your physician recommends that you continue on your current medications as directed. Please refer to the Current Medication list given to you today.  Your physician wants you to follow-up in: 4 month ov You will receive a reminder letter in the mail two months in advance. If you don't receive a letter, please call our office to schedule the follow-up appointment.  

## 2014-03-27 NOTE — Assessment & Plan Note (Signed)
The patient is not having any symptoms of angina pectoris or CHF or dizziness or syncope

## 2014-04-26 ENCOUNTER — Other Ambulatory Visit: Payer: Self-pay

## 2014-05-19 ENCOUNTER — Other Ambulatory Visit: Payer: Self-pay | Admitting: Cardiology

## 2014-06-16 ENCOUNTER — Other Ambulatory Visit: Payer: Self-pay | Admitting: Cardiology

## 2014-07-23 ENCOUNTER — Other Ambulatory Visit: Payer: Self-pay | Admitting: Cardiology

## 2014-07-30 ENCOUNTER — Encounter: Payer: Self-pay | Admitting: Cardiology

## 2014-07-30 ENCOUNTER — Ambulatory Visit (INDEPENDENT_AMBULATORY_CARE_PROVIDER_SITE_OTHER): Payer: Medicare Other | Admitting: Cardiology

## 2014-07-30 VITALS — BP 116/62 | HR 57 | Ht 68.0 in | Wt 157.0 lb

## 2014-07-30 DIAGNOSIS — I480 Paroxysmal atrial fibrillation: Secondary | ICD-10-CM

## 2014-07-30 DIAGNOSIS — Q231 Congenital insufficiency of aortic valve: Secondary | ICD-10-CM

## 2014-07-30 NOTE — Patient Instructions (Signed)
Your physician recommends that you continue on your current medications as directed. Please refer to the Current Medication list given to you today.  Your physician recommends that you schedule a follow-up appointment in: [place]

## 2014-07-30 NOTE — Progress Notes (Signed)
Brian Klein Date of Birth:  1945-12-30 [facility] [address] [place], [place]  [address] [phone]        Fax   [phone]   History of Present Illness: This pleasant 69 year old gentleman is seen for a followup office visit. He was seen on 12/17/11 at which time he was found to be in atrial fibrillation. He had noticed an irregular pulse and less stamina several weeks prior to that visit. He has been on Xarelto 20 mg daily since 12/17/11. He has not been experiencing any bleeding problems from the Xarelto. The patient has had subsequent intermittent atrial fibrillation. We subsequently added flecainide 50 mg twice a day and he has done well on that. He has not been aware of any recurrent atrial fibrillation.  He remains in normal sinus rhythm clinically.    The patient has a history of bicuspid aortic valve. Last echocardiogram 03/20/13 shows mild aortic stenosis and mild aortic insufficiency and normal LV function with ejection fraction 55-60%.  The patient has a history of erectile dysfunction. Since last visit we have successfully reduced his dose of Toprol to just 12.5 mg daily. Since last visit he has not had any episodes of documented atrial fibrillation.   Current Outpatient Prescriptions  Medication Sig Dispense Refill  . amoxicillin (AMOXIL) 500 MG tablet Take 500 mg by mouth 2 (two) times daily. 4 tablets 1 hour prior DENTAL  to procedure    . Cholecalciferol (VITAMIN D) 2000 UNITS tablet Take 2,000 Units by mouth daily.      . Coenzyme Q10 (CO Q 10) 100 MG CAPS Take by mouth daily.    Marland Kitchen ezetimibe-simvastatin (VYTORIN) 10-40 MG per tablet Take 1 tablet by mouth at bedtime.      . fexofenadine (ALLEGRA) 60 MG tablet Take 60 mg by mouth as needed for allergies or rhinitis.     . fish oil-omega-3 fatty acids 1000 MG capsule Take 1 g by mouth 2 (two) times daily.     . flecainide (TAMBOCOR) 50 MG tablet TAKE 1 TABLET BY MOUTH TWICE DAILY 180 tablet 0    . metoprolol succinate (TOPROL-XL) 25 MG 24 hr tablet TAKE 1/2 TABLET BY MOUTH DAILY 45 tablet 1  . sildenafil (VIAGRA) 50 MG tablet Take 1 tablet (50 mg total) by mouth daily as needed. 5 tablet 3  . XARELTO 20 MG TABS tablet TAKE 1 TABLET BY MOUTH EVERY DAY 90 tablet 0   No current facility-administered medications for this visit.    No Known Allergies  Patient Active Problem List   Diagnosis Date Noted  . Erectile dysfunction 01/10/2013  . Atrial fibrillation 12/17/2011  . Bicuspid aortic valve 12/16/2010  . Hypercholesterolemia 12/16/2010    History  Smoking status  . Never Smoker   Smokeless tobacco  . Never Used    History  Alcohol Use No    Family History  Problem Relation Age of Onset  . Stroke Mother   . Heart attack Father   . Hypertension Father   . Hypertension Sister   . Colon cancer Neg Hx     Review of Systems: Constitutional: no fever chills diaphoresis or fatigue or change in weight.  Head and neck: no hearing loss, no epistaxis, no photophobia or visual disturbance. Respiratory: No cough, shortness of breath or wheezing. Cardiovascular: No chest pain peripheral edema, palpitations. Gastrointestinal: No abdominal distention, no abdominal pain, no change in bowel habits hematochezia or melena. Genitourinary: No dysuria, no frequency,  no urgency, no nocturia. Musculoskeletal:No arthralgias, no back pain, no gait disturbance or myalgias. Neurological: No dizziness, no headaches, no numbness, no seizures, no syncope, no weakness, no tremors. Hematologic: No lymphadenopathy, no easy bruising. Psychiatric: No confusion, no hallucinations, no sleep disturbance.    Physical Exam: Filed Vitals:   07/30/14 1328  BP: 116/62  Pulse: 57   The general appearance reveals a well-developed well-nourished gentleman in no distress.The head and neck exam reveals pupils equal and reactive.  Extraocular movements are full.  There is no scleral icterus.  The mouth  and pharynx are normal.  The neck is supple.  The carotids reveal no bruits.  The jugular venous pressure is normal.  The  thyroid is not enlarged.  There is no lymphadenopathy.  The chest is clear to percussion and auscultation.  There are no rales or rhonchi.  Expansion of the chest is symmetrical.  The precordium is quiet.  The first heart sound is normal.  The second heart sound is physiologically split.  There is no gallop rub or click.  There is a grade 2/6 harsh systolic ejection murmur at the base.  No diastolic murmur.  There is no abnormal lift or heave.  The abdomen is soft and nontender.  The bowel sounds are normal.  The liver and spleen are not enlarged.  There are no abdominal masses.  There are no abdominal bruits.  Extremities reveal good pedal pulses.  There is no phlebitis or edema.  There is no cyanosis or clubbing.  Strength is normal and symmetrical in all extremities.  There is no lateralizing weakness.  There are no sensory deficits.  The skin is warm and dry.  There is no rash.   Assessment / Plan: 1. paroxysmal atrial fibrillation, currently in normal sinus rhythm on flecainide.  On Xarelto, no bleeding problems.  Renal function checked by PCP has been normal. 2. aortic valve disease with last echo 03/20/13 showing mild aortic stenosis and mild aortic insufficiency and an ejection fraction of 55-60%. 3.  Occasional lightheadedness which feels like low blood pressure to him, occurring when he stands up etc. his blood pressure is relatively low.  When he becomes symptomatic he will increase his salt and water intake slightly.  Plan: Continue current medication.  Recheck in 4 months for office visit and EKG

## 2014-09-17 ENCOUNTER — Other Ambulatory Visit: Payer: Self-pay | Admitting: Cardiology

## 2014-10-14 ENCOUNTER — Telehealth: Payer: Self-pay | Admitting: Cardiology

## 2014-10-14 ENCOUNTER — Other Ambulatory Visit: Payer: Self-pay | Admitting: Cardiology

## 2014-10-14 NOTE — Telephone Encounter (Signed)
New message      Pt c/o medication issue:  1. Name of Medication: xarleto 2. How are you currently taking this medication (dosage and times per day)? 20mg  3. Are you having a reaction (difficulty breathing--STAT)?  4. What is your medication issue? Need prior authorization for medication

## 2014-10-16 NOTE — Telephone Encounter (Signed)
Spoke with [facility] and approved until April 2016

## 2014-10-23 ENCOUNTER — Other Ambulatory Visit: Payer: Self-pay | Admitting: Cardiology

## 2014-11-13 ENCOUNTER — Other Ambulatory Visit: Payer: Self-pay | Admitting: Cardiology

## 2014-11-15 ENCOUNTER — Telehealth: Payer: Self-pay | Admitting: Cardiology

## 2014-11-15 NOTE — Telephone Encounter (Signed)
Discussed with Brian Klein Advised ok to use

## 2014-11-15 NOTE — Telephone Encounter (Signed)
New message        Pt would like to know if he can use Visine A for his eyes

## 2014-11-17 NOTE — Telephone Encounter (Signed)
Agree with advice given

## 2014-11-19 ENCOUNTER — Encounter: Payer: Self-pay | Admitting: Cardiology

## 2014-11-19 ENCOUNTER — Ambulatory Visit (INDEPENDENT_AMBULATORY_CARE_PROVIDER_SITE_OTHER): Payer: Medicare Other | Admitting: Cardiology

## 2014-11-19 VITALS — BP 120/80 | HR 59 | Ht 68.0 in | Wt 154.4 lb

## 2014-11-19 DIAGNOSIS — Q231 Congenital insufficiency of aortic valve: Secondary | ICD-10-CM | POA: Diagnosis not present

## 2014-11-19 DIAGNOSIS — E7800 Pure hypercholesterolemia, unspecified: Secondary | ICD-10-CM

## 2014-11-19 DIAGNOSIS — I480 Paroxysmal atrial fibrillation: Secondary | ICD-10-CM | POA: Diagnosis not present

## 2014-11-19 DIAGNOSIS — J302 Other seasonal allergic rhinitis: Secondary | ICD-10-CM | POA: Diagnosis not present

## 2014-11-19 HISTORY — DX: Other seasonal allergic rhinitis: J30.2

## 2014-11-19 NOTE — Progress Notes (Signed)
Cardiology Office Note   Date:  11/19/2014   ID:  Brian Klein, DOB [birth year], MRN [id number]  PCP:  Jerlyn Ly, MD  Cardiologist: Darlin Coco MD  No chief complaint on file.     History of Present Illness: Brian Klein is a 69 y.o. male who presents for four-month follow-up office visit.  This pleasant 69 year old gentleman is seen for a followup office visit. He was seen on 12/17/11 at which time he was found to be in atrial fibrillation. He had noticed an irregular pulse and less stamina several weeks prior to that visit. He has been on Xarelto 20 mg daily since 12/17/11. He has not been experiencing any bleeding problems from the Xarelto. The patient has had subsequent intermittent atrial fibrillation. We subsequently added flecainide 50 mg twice a day and he has done well on that. He has not been aware of any recurrent atrial fibrillation. He remains in normal sinus rhythm clinically.  The patient has a history of bicuspid aortic valve. Last echocardiogram 03/20/13 shows mild aortic stenosis and mild aortic insufficiency and normal LV function with ejection fraction 55-60%. The patient has a history of erectile dysfunction. Since last visit we have successfully reduced his dose of Toprol to just 12.5 mg daily.  He has Viagra on hand for when necessary use. Since last visit he has not had any episodes of documented atrial fibrillation. Every May he has terrible allergies with coryza and sinus congestion.  This may is no different.  He has a lot of coughing and we will suggest he try some generic Mucinex to help with the cough and with allergy.  Past Medical History  Diagnosis Date  . AVD (aortic valve disease)     with bicupsid aortic valve  . MVP (mitral valve prolapse)   . Heart murmur   . 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 Hyperlipidemia   . Atrial fibrillation     Past Surgical History  Procedure Laterality Date  . Transthoracic echocardiogram   07/18/2002    ef 71%  . Mandible surgery  1976    right removal -- cancer  . Mandible surgery  1987    right reconstruction  . Eyelid laceration repair  2008    left and right     Current Outpatient Prescriptions  Medication Sig Dispense Refill  . amoxicillin (AMOXIL) 500 MG capsule Take 4 tablets by mouth 1 hour prior to dentist procedure 4 capsule 3  . Cholecalciferol (VITAMIN D) 2000 UNITS tablet Take 2,000 Units by mouth daily.      . Coenzyme Q10 (CO Q 10) 100 MG CAPS Take by mouth daily.    Marland Kitchen ezetimibe-simvastatin (VYTORIN) 10-40 MG per tablet Take 1 tablet by mouth at bedtime.      . fexofenadine (ALLEGRA) 60 MG tablet Take 60 mg by mouth as needed for allergies or rhinitis.     . fish oil-omega-3 fatty acids 1000 MG capsule Take 1 g by mouth daily.     . flecainide (TAMBOCOR) 50 MG tablet TAKE 1 TABLET BY MOUTH TWICE DAILY 180 tablet 0  . guaiFENesin (MUCINEX) 600 MG 12 hr tablet Take 600 mg by mouth 2 (two) times daily as needed.    . meclizine (ANTIVERT) 25 MG tablet Take 25 mg by mouth daily as needed. FOR DIZZINESS  1  . metoprolol succinate (TOPROL-XL) 25 MG 24 hr tablet TAKE 1/2 TABLET BY MOUTH DAILY 45 tablet 6  . sildenafil (VIAGRA)  50 MG tablet Take 1 tablet (50 mg total) by mouth daily as needed. 5 tablet 3  . XARELTO 20 MG TABS tablet TAKE 1 TABLET BY MOUTH EVERY DAY 90 tablet 0   No current facility-administered medications for this visit.    Allergies:   Review of patient's allergies indicates no known allergies.    Social History:  The patient  reports that he has never smoked. He has never used smokeless tobacco. He reports that he does not drink alcohol or use illicit drugs.   Family History:  The patient's family history includes Heart attack in his father; Hypertension in his father and sister; Stroke in his mother. There is no history of Colon cancer.    ROS:  Please see the history of present illness.   Otherwise, review of systems are positive for none.    All other systems are reviewed and negative.    PHYSICAL EXAM: VS:  BP 120/80 mmHg  Pulse 59  Ht 5\' 8"  (1.727 m)  Wt 154 lb 6.4 oz (70.035 kg)  BMI 23.48 kg/m2 , BMI Body mass index is 23.48 kg/(m^2). GEN: Well nourished, well developed, in no acute distress HEENT: normal Neck: no JVD, carotid bruits, or masses Cardiac: RRR; there is a grade 2/6 systolic ejection murmur at the base. Respiratory:  clear to auscultation bilaterally, normal work of breathing GI: soft, nontender, nondistended, + BS MS: no deformity or atrophy Skin: warm and dry, no rash Neuro:  Strength and sensation are intact Psych: euthymic mood, full affect   EKG:  EKG is not ordered today.    Recent Labs: No results found for requested labs within last 365 days.    Lipid Panel No results found for: CHOL, TRIG, HDL, CHOLHDL, VLDL, LDLCALC, LDLDIRECT    Wt Readings from Last 3 Encounters:  11/19/14 154 lb 6.4 oz (70.035 kg)  07/30/14 157 lb (71.215 kg)  03/27/14 156 lb (70.761 kg)       ASSESSMENT AND PLAN:  1. paroxysmal atrial fibrillation, currently in normal sinus rhythm on flecainide. On Xarelto, no bleeding problems. Renal function checked by PCP has been normal. 2. aortic valve disease with last echo 03/20/13 showing mild aortic stenosis and mild aortic insufficiency and an ejection fraction of 55-60%. 3. Occasional lightheadedness which feels like low blood pressure to him, occurring when he stands up etc. his blood pressure is relatively low. When he becomes symptomatic he will increase his salt and water intake slightly. 4.  Severe seasonal allergies  Plan: Continue current medication. Recheck in 4 months for office visit and EKG   Current medicines are reviewed at length with the patient today.  The patient does not have concerns regarding medicines.  The following changes have been made:  no change  Labs/ tests ordered today include:  No orders of the defined types were placed  in this encounter.       Berna Spare MD 11/19/2014 6:09 PM    [address], [place], [place]  [address] Phone: [phone]; Fax: [phone]

## 2014-11-19 NOTE — Patient Instructions (Addendum)
Medication Instructions:  TRY PLAIN MUCINEX TWICE A DAY AS NEEDED FOR COUGH  Labwork: NONE  Testing/Procedures: NOND  Follow-Up: Your physician recommends that you schedule a follow-up appointment in: [place]

## 2014-12-12 ENCOUNTER — Other Ambulatory Visit: Payer: Self-pay | Admitting: Cardiology

## 2015-01-06 ENCOUNTER — Encounter: Payer: Self-pay | Admitting: Cardiology

## 2015-01-06 ENCOUNTER — Other Ambulatory Visit: Payer: Self-pay

## 2015-01-07 ENCOUNTER — Other Ambulatory Visit (HOSPITAL_COMMUNITY): Payer: Self-pay | Admitting: Internal Medicine

## 2015-01-07 DIAGNOSIS — G459 Transient cerebral ischemic attack, unspecified: Secondary | ICD-10-CM

## 2015-01-17 ENCOUNTER — Other Ambulatory Visit: Payer: Self-pay | Admitting: Cardiology

## 2015-03-18 ENCOUNTER — Other Ambulatory Visit: Payer: Self-pay | Admitting: Cardiology

## 2015-04-11 ENCOUNTER — Encounter: Payer: Self-pay | Admitting: Cardiology

## 2015-04-11 ENCOUNTER — Ambulatory Visit (INDEPENDENT_AMBULATORY_CARE_PROVIDER_SITE_OTHER): Payer: Medicare Other | Admitting: Cardiology

## 2015-04-11 VITALS — BP 112/70 | HR 51 | Ht 67.5 in | Wt 153.1 lb

## 2015-04-11 DIAGNOSIS — Q231 Congenital insufficiency of aortic valve: Secondary | ICD-10-CM | POA: Diagnosis not present

## 2015-04-11 DIAGNOSIS — I480 Paroxysmal atrial fibrillation: Secondary | ICD-10-CM | POA: Diagnosis not present

## 2015-04-11 DIAGNOSIS — E7800 Pure hypercholesterolemia, unspecified: Secondary | ICD-10-CM

## 2015-04-11 NOTE — Patient Instructions (Signed)
Medication Instructions:  Your physician recommends that you continue on your current medications as directed. Please refer to the Current Medication list given to you today.  Labwork: none  Testing/Procedures: none  Follow-Up: Your physician recommends that you schedule a follow-up appointment in: 4 month ov with Lori G NP or Scott W PA      

## 2015-04-11 NOTE — Progress Notes (Signed)
Cardiology Office Note   Date:  04/11/2015   ID:  Brian Klein, DOB November 08, 1945, MRN [id number]  PCP:  Jerlyn Ly, MD  Cardiologist: Darlin Coco MD  No chief complaint on file.     History of Present Illness: Brian Klein is a 69 y.o. male who presents for a four-month follow-up office visit.  This pleasant 68 year old gentleman is seen for a followup office visit. He was seen on 12/17/11 at which time he was found to be in atrial fibrillation. He had noticed an irregular pulse and less stamina several weeks prior to that visit. He has been on Xarelto 20 mg daily since 12/17/11. He has not been experiencing any bleeding problems from the Xarelto. The patient has had subsequent intermittent atrial fibrillation. We subsequently added flecainide 50 mg twice a day and he has done well on that. He has not been aware of any recurrent atrial fibrillation. He remains in normal sinus rhythm clinically.  The patient has a history of bicuspid aortic valve. Last echocardiogram 03/20/13 shows mild aortic stenosis and mild aortic insufficiency and normal LV function with ejection fraction 55-60%.  He has not been having any chest pain or shortness of breath.  No dizziness or syncope. The patient has a history of erectile dysfunction. Since last visit we have successfully reduced his dose of Toprol to just 12.5 mg daily. He has Viagra on hand for when necessary use. Since last visit he has not had any episodes of documented atrial fibrillation. He has a history of severe seasonal allergies.  They were quite bad last May.  Associated with his severe allergies he had 2 migraine headaches and a brief episode of transient global amnesia.  Those symptoms have not recurred. He will be having his annual physical exam with Dr. Joylene Draft in November or December.   Past Medical History  Diagnosis Date  . AVD (aortic valve disease)     with bicupsid aortic valve  . MVP (mitral valve prolapse)   .  Heart murmur   . 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 Hyperlipidemia   . Atrial fibrillation     Past Surgical History  Procedure Laterality Date  . Transthoracic echocardiogram  07/18/2002    ef 71%  . Mandible surgery  1976    right removal -- cancer  . Mandible surgery  1987    right reconstruction  . Eyelid laceration repair  2008    left and right     Current Outpatient Prescriptions  Medication Sig Dispense Refill  . amoxicillin (AMOXIL) 500 MG capsule Take 4 tablets by mouth 1 hour prior to dentist procedure 4 capsule 3  . Cholecalciferol (VITAMIN D) 2000 UNITS tablet Take 2,000 Units by mouth daily.      . Coenzyme Q10 (CO Q 10) 100 MG CAPS Take 1 capsule by mouth daily.     Marland Kitchen ezetimibe-simvastatin (VYTORIN) 10-40 MG per tablet Take 1 tablet by mouth at bedtime.      . fexofenadine (ALLEGRA) 60 MG tablet Take 60 mg by mouth as needed for allergies or rhinitis.     . fish oil-omega-3 fatty acids 1000 MG capsule Take 1 g by mouth daily.     . flecainide (TAMBOCOR) 50 MG tablet TAKE 1 TABLET BY MOUTH TWICE DAILY 180 tablet 0  . guaiFENesin (MUCINEX) 600 MG 12 hr tablet Take 600 mg by mouth 2 (two) times daily as needed (cough).     . meclizine (ANTIVERT)  25 MG tablet Take 25 mg by mouth daily as needed. FOR DIZZINESS  1  . metoprolol succinate (TOPROL-XL) 25 MG 24 hr tablet TAKE 1/2 TABLET BY MOUTH DAILY 45 tablet 6  . sildenafil (VIAGRA) 50 MG tablet Take 50 mg by mouth daily as needed for erectile dysfunction (erectile dysfunction).    Alveda Reasons 20 MG TABS tablet TAKE 1 TABLET BY MOUTH DAILY 90 tablet 0   No current facility-administered medications for this visit.    Allergies:   Review of patient's allergies indicates no known allergies.    Social History:  The patient  reports that he has never smoked. He has never used smokeless tobacco. He reports that he does not drink alcohol or use illicit drugs.   Family History:  The patient's family history includes Heart  attack in his father; Hypertension in his father and sister; Stroke in his mother. There is no history of Colon cancer.    ROS:  Please see the history of present illness.   Otherwise, review of systems are positive for .   All other systems are reviewed and negative.    PHYSICAL EXAM: VS:  BP 112/70 mmHg  Pulse 51  Ht 5' 7.5" (1.715 m)  Wt 153 lb 1.9 oz (69.455 kg)  BMI 23.61 kg/m2 , BMI Body mass index is 23.61 kg/(m^2). GEN: Well nourished, well developed, in no acute distress HEENT: normal Neck: no JVD, carotid bruits, or masses Cardiac: RRR; no  rubs, or gallops,no edema.  There is a soft systolic ejection murmur at the base radiating to the carotids.  Respiratory:  clear to auscultation bilaterally, normal work of breathing GI: soft, nontender, nondistended, + BS MS: no deformity or atrophy Skin: warm and dry, no rash Neuro:  Strength and sensation are intact Psych: euthymic mood, full affect   EKG:  EKG is ordered today. The ekg ordered today demonstrates sinus bradycardia.  QTC interval normal at 403   Recent Labs: No results found for requested labs within last 365 days.    Lipid Panel No results found for: CHOL, TRIG, HDL, CHOLHDL, VLDL, LDLCALC, LDLDIRECT    Wt Readings from Last 3 Encounters:  04/11/15 153 lb 1.9 oz (69.455 kg)  11/19/14 154 lb 6.4 oz (70.035 kg)  07/30/14 157 lb (71.215 kg)        ASSESSMENT AND PLAN:  1. paroxysmal atrial fibrillation, currently in normal sinus rhythm on flecainide. On Xarelto, no bleeding problems. Renal function checked by PCP has been normal. 2. aortic valve disease with last echo 03/20/13 showing mild aortic stenosis and mild aortic insufficiency and an ejection fraction of 55-60%. 3. Occasional lightheadedness which feels like low blood pressure to him, occurring when he stands up etc. his blood pressure is relatively low. When he becomes symptomatic he will increase his salt and water intake slightly. 4. Severe  seasonal allergies, improved since May 5.  Erectile dysfunction.  Has Viagra on hand.   Current medicines are reviewed at length with the patient today.  The patient does not have concerns regarding medicines.  The following changes have been made:  no change  Labs/ tests ordered today include:   Orders Placed This Encounter  Procedures  . EKG 12-Lead    Disposition: Continue current medication.  Recheck in 4 months.   Berna Spare MD 04/11/2015 9:04 AM    [facility], [place], [place]  [address] Phone: [phone]; Fax: [phone]

## 2015-04-28 ENCOUNTER — Other Ambulatory Visit: Payer: Self-pay

## 2015-04-28 MED ORDER — FLECAINIDE ACETATE 50 MG PO TABS: 50.0000 mg | ORAL_TABLET | Freq: Two times a day (BID) | ORAL | Status: DC

## 2015-06-12 ENCOUNTER — Telehealth: Payer: Self-pay | Admitting: Cardiology

## 2015-06-12 NOTE — Telephone Encounter (Signed)
Discussed with Alena Bills D and ok to take medication again tonight Did advise to take closer to the 10 pm hour tonight and try to work his way back to 6 pm time  Verbalized understanding

## 2015-06-12 NOTE — Telephone Encounter (Signed)
New message     Pt c/o medication issue:  1. Name of Medication: xarelto 2. How are you currently taking this medication (dosage and times per day)? 20mg   3. Are you having a reaction (difficulty breathing--STAT)? no 4. What is your medication issue?  Pt took a 20mg  pill last night at 6pm; forgot and took another one last night at 10pm.  Please advise

## 2015-06-16 ENCOUNTER — Other Ambulatory Visit: Payer: Self-pay | Admitting: Cardiology

## 2015-07-25 ENCOUNTER — Encounter: Payer: Self-pay | Admitting: Cardiology

## 2015-07-30 ENCOUNTER — Other Ambulatory Visit: Payer: Self-pay | Admitting: *Deleted

## 2015-07-30 MED ORDER — FLECAINIDE ACETATE 50 MG PO TABS: 50.0000 mg | ORAL_TABLET | Freq: Two times a day (BID) | ORAL | Status: DC

## 2015-08-12 ENCOUNTER — Ambulatory Visit (INDEPENDENT_AMBULATORY_CARE_PROVIDER_SITE_OTHER): Payer: Medicare Other | Admitting: Nurse Practitioner

## 2015-08-12 ENCOUNTER — Encounter: Payer: Self-pay | Admitting: Nurse Practitioner

## 2015-08-12 VITALS — BP 122/60 | HR 53 | Ht 67.0 in | Wt 157.8 lb

## 2015-08-12 DIAGNOSIS — Q231 Congenital insufficiency of aortic valve: Secondary | ICD-10-CM | POA: Diagnosis not present

## 2015-08-12 DIAGNOSIS — I480 Paroxysmal atrial fibrillation: Secondary | ICD-10-CM | POA: Diagnosis not present

## 2015-08-12 DIAGNOSIS — Z79899 Other long term (current) drug therapy: Secondary | ICD-10-CM | POA: Diagnosis not present

## 2015-08-12 DIAGNOSIS — E7800 Pure hypercholesterolemia, unspecified: Secondary | ICD-10-CM

## 2015-08-12 NOTE — Patient Instructions (Addendum)
We will be checking the following labs today - NONE   Medication Instructions:    Continue with your current medicines.     Testing/Procedures To Be Arranged:    Echocardiogram  An echocardiogram, or echocardiography, uses sound waves (ultrasound) to produce an image of your heart. The echocardiogram is simple, painless, obtained within a short period of time, and offers valuable information to your health care provider. The images from an echocardiogram can provide information such as: Evidence of coronary artery disease (CAD). Heart size. Heart muscle function. Heart valve function. Aneurysm detection. Evidence of a past heart attack. Fluid buildup around the heart. Heart muscle thickening. Assess heart valve function. LET [facility] CARE PROVIDER KNOW ABOUT: Any allergies you have. All medicines you are taking, including vitamins, herbs, eye drops, creams, and over-the-counter medicines. Previous problems you or members of your family have had with the use of anesthetics. Any blood disorders you have. Previous surgeries you have had. Medical conditions you have. Possibility of pregnancy, if this applies. BEFORE THE PROCEDURE  No special preparation is needed. Eat and drink normally.  PROCEDURE  In order to produce an image of your heart, gel will be applied to your chest and a wand-like tool (transducer) will be moved over your chest. The gel will help transmit the sound waves from the transducer. The sound waves will harmlessly bounce off your heart to allow the heart images to be captured in real-time motion. These images will then be recorded. You may need an IV to receive a medicine that improves the quality of the pictures. AFTER THE PROCEDURE You may return to your normal schedule including diet, activities, and medicines, unless your health care provider tells you otherwise.   Follow-Up:   See me in 6 months.     Other Special Instructions:    N/A    If you need a refill on your cardiac medications before your next appointment, please call your pharmacy.   Call the [place] office at [phone] if you have any questions, problems or concerns.

## 2015-08-12 NOTE — Progress Notes (Signed)
CARDIOLOGY OFFICE NOTE  Date:  08/12/2015    Brian Klein Date of Birth: 19-Apr-1946 Medical Record [id number]  PCP:  Jerlyn Ly, MD  Cardiologist:  Mare Ferrari - to follow with me    Chief Complaint  Patient presents with  . Aortic Stenosis  . Hyperlipidemia  . Atrial Fibrillation    4 month check - seen for Dr. Mare Ferrari    History of Present Illness: Brian Klein is a 70 y.o. male who presents today for a follow up visit. Seen for Dr. Mare Ferrari. He has had AF on Xarelto and Flecainide. The patient has a history of bicuspid aortic valve. Last echocardiogram 03/20/13 shows mild aortic stenosis and mild aortic insufficiency and normal LV function with ejection fraction 55-60%. Other issues include HLD and ED.  Last seen in September and he was felt to be doing well.  Comes back today. Here with his wife today. He has done well since his last visit here. He remains active - regularly goes to the gym. No chest pain. Not short of breath. Not dizzy or lightheaded. No syncope. Tolerating his medicines. Rhythm has been stable.   Past Medical History  Diagnosis Date  . AVD (aortic valve disease)     with bicupsid aortic valve  . MVP (mitral valve prolapse)   . Heart murmur   . Osteogenic sarcoma ([place]) 1976    jaw bone  . Hyperlipidemia   . Atrial fibrillation [facility])     Past Surgical History  Procedure Laterality Date  . Transthoracic echocardiogram  07/18/2002    ef 71%  . Mandible surgery  1976    right removal -- cancer  . Mandible surgery  1987    right reconstruction  . Eyelid laceration repair  2008    left and right     Medications: Current Outpatient Prescriptions  Medication Sig Dispense Refill  . amoxicillin (AMOXIL) 500 MG capsule Take 4 tablets by mouth 1 hour prior to dentist procedure 4 capsule 3  . Cholecalciferol (VITAMIN D) 2000 UNITS tablet Take 2,000 Units by mouth daily.      . Coenzyme Q10 (CO Q 10) 100 MG CAPS Take 1 capsule by mouth  daily.     Marland Kitchen ezetimibe-simvastatin (VYTORIN) 10-40 MG per tablet Take 1 tablet by mouth at bedtime.      . fexofenadine (ALLEGRA) 60 MG tablet Take 60 mg by mouth as needed for allergies or rhinitis.     . fish oil-omega-3 fatty acids 1000 MG capsule Take 1 g by mouth daily.     . flecainide (TAMBOCOR) 50 MG tablet Take 1 tablet (50 mg total) by mouth 2 (two) times daily. 180 tablet 2  . guaiFENesin (MUCINEX) 600 MG 12 hr tablet Take 600 mg by mouth 2 (two) times daily as needed (cough).     . meclizine (ANTIVERT) 25 MG tablet Take 25 mg by mouth daily as needed. FOR DIZZINESS  1  . metoprolol succinate (TOPROL-XL) 25 MG 24 hr tablet TAKE 1/2 TABLET BY MOUTH DAILY 45 tablet 6  . sildenafil (VIAGRA) 50 MG tablet Take 50 mg by mouth daily as needed for erectile dysfunction (erectile dysfunction).    Alveda Reasons 20 MG TABS tablet TAKE 1 TABLET BY MOUTH DAILY 90 tablet 2   No current facility-administered medications for this visit.    Allergies: No Known Allergies  Social History: The patient  reports that he has never smoked. He has never used smokeless tobacco. He reports that  he does not drink alcohol or use illicit drugs.   Family History: The patient's family history includes Heart attack in his father; Hypertension in his father and sister; Stroke in his mother. There is no history of Colon cancer.   Review of Systems: Please see the history of present illness.   Otherwise, the review of systems is positive for none.   All other systems are reviewed and negative.   Physical Exam: VS:  BP 122/60 mmHg  Pulse 53  Ht 5\' 7"  (1.702 m)  Wt 157 lb 12.8 oz (71.578 kg)  BMI 24.71 kg/m2 .  BMI Body mass index is 24.71 kg/(m^2).  Wt Readings from Last 3 Encounters:  08/12/15 157 lb 12.8 oz (71.578 kg)  04/11/15 153 lb 1.9 oz (69.455 kg)  11/19/14 154 lb 6.4 oz (70.035 kg)    General: Pleasant. Well developed, well nourished and in no acute distress.  HEENT: Normal but has had prior  mandible. Neck: Supple, no JVD, carotid bruits, or masses noted.  Cardiac: Regular rate and rhythm. He has an outflow murmur.  No edema.  Respiratory:  Lungs are clear to auscultation bilaterally with normal work of breathing.  GI: Soft and nontender.  MS: No deformity or atrophy. Gait and ROM intact. Skin: Warm and dry. Color is normal.  Neuro:  Strength and sensation are intact and no gross focal deficits noted.  Psych: Alert, appropriate and with normal affect.   LABORATORY DATA:  EKG:  EKG is ordered today. This demonstrates sinus bradycardia with chronic inferior T wave changes.  Lab Results  Component Value Date   WBC 5.1 12/17/2011   HGB 13.0 12/17/2011   HCT 38.9* 12/17/2011   PLT 192.0 12/17/2011   GLUCOSE 97 10/04/2012   ALT 20 12/17/2011   AST 25 12/17/2011   NA 139 10/04/2012   K 4.4 10/04/2012   CL 105 10/04/2012   CREATININE 1.0 10/04/2012   BUN 14 10/04/2012   CO2 27 10/04/2012   TSH 2.28 12/17/2011    BNP (last 3 results) No results for input(s): BNP in the last 8760 hours.  ProBNP (last 3 results) No results for input(s): PROBNP in the last 8760 hours.   Other Studies Reviewed Today:  Echo Study Conclusions from 2014  - Left ventricle: The cavity size was normal. There was mild concentric hypertrophy. Systolic function was normal. The estimated ejection fraction was in the range of 55% to 60%. Wall motion was normal; there were no regional wall motion abnormalities. Doppler parameters are consistent with abnormal left ventricular relaxation (grade 1 diastolic dysfunction). - Aortic valve: Bicuspid with calcified raphe at 2:00 There was mild stenosis. Mild regurgitation. Mean gradient: 76mm Hg (S). Peak gradient: 35mm Hg (S). - Mitral valve: Calcified annulus. - Left atrium: The atrium was mildly dilated.   Assessment/Plan: 1. PAF - managed with AAD therapy and anticoagulation - he is doing well clinically.   2. Bicuspid  AV/AS - needs echo updated.   3. HLD - on statin - has labs with PCP  4. Chronic anticoagulation with Xarelto - no problems noted.   Current medicines are reviewed with the patient today.  The patient does not have concerns regarding medicines other than what has been noted above.  The following changes have been made:  See above.  Labs/ tests ordered today include:    Orders Placed This Encounter  Procedures  . EKG 12-Lead  . ECHOCARDIOGRAM COMPLETE     Disposition:   He would like to  follow with me going forward. See back in 6 months.  Patient is agreeable to this plan and will call if any problems develop in the interim.   Signed: Burtis Junes, RN, ANP-C 08/12/2015 9:31 AM  [facility] [address] [place], [place]  [address] Phone: [phone] Fax: [phone]

## 2015-08-26 ENCOUNTER — Ambulatory Visit (HOSPITAL_COMMUNITY): Payer: Medicare Other | Attending: Cardiology

## 2015-08-26 ENCOUNTER — Other Ambulatory Visit: Payer: Self-pay

## 2015-08-26 DIAGNOSIS — I352 Nonrheumatic aortic (valve) stenosis with insufficiency: Secondary | ICD-10-CM | POA: Diagnosis not present

## 2015-08-26 DIAGNOSIS — I480 Paroxysmal atrial fibrillation: Secondary | ICD-10-CM | POA: Diagnosis not present

## 2015-08-26 DIAGNOSIS — I359 Nonrheumatic aortic valve disorder, unspecified: Secondary | ICD-10-CM | POA: Diagnosis present

## 2015-08-26 DIAGNOSIS — I059 Rheumatic mitral valve disease, unspecified: Secondary | ICD-10-CM | POA: Diagnosis not present

## 2015-08-26 DIAGNOSIS — Q231 Congenital insufficiency of aortic valve: Secondary | ICD-10-CM | POA: Insufficient documentation

## 2015-08-26 DIAGNOSIS — E7800 Pure hypercholesterolemia, unspecified: Secondary | ICD-10-CM | POA: Insufficient documentation

## 2015-08-26 DIAGNOSIS — Z79899 Other long term (current) drug therapy: Secondary | ICD-10-CM | POA: Diagnosis not present

## 2015-11-11 ENCOUNTER — Other Ambulatory Visit: Payer: Self-pay | Admitting: Internal Medicine

## 2015-11-11 DIAGNOSIS — R1031 Right lower quadrant pain: Secondary | ICD-10-CM

## 2015-11-12 ENCOUNTER — Other Ambulatory Visit: Payer: Self-pay | Admitting: *Deleted

## 2015-11-12 MED ORDER — AMOXICILLIN 500 MG PO CAPS: ORAL_CAPSULE | ORAL | Status: DC

## 2015-11-12 NOTE — Telephone Encounter (Signed)
Patient has a dental appointment today at 2. Ok to refill?

## 2015-11-13 ENCOUNTER — Ambulatory Visit
Admission: RE | Admit: 2015-11-13 | Discharge: 2015-11-13 | Disposition: A | Discharge status: Home / Self Care | Payer: Medicare Other | Source: Ambulatory Visit | Attending: Internal Medicine | Admitting: Internal Medicine

## 2015-11-13 DIAGNOSIS — R1031 Right lower quadrant pain: Secondary | ICD-10-CM

## 2015-11-13 MED ORDER — IOPAMIDOL (ISOVUE-300) INJECTION 61%
100.0000 mL | Freq: Once | INTRAVENOUS | Status: AC | PRN
  Administered 2015-11-13: 100 mL via INTRAVENOUS

## 2015-11-18 ENCOUNTER — Telehealth: Payer: Self-pay

## 2015-11-18 NOTE — Telephone Encounter (Signed)
Pt is on xeralto. Pt scheduled to see Alonza Bogus PA 11/26/15@11am . Pt interested in having colon done also if possible, recall date close.Pt aware of appt.

## 2015-11-18 NOTE — Telephone Encounter (Signed)
Direct LEC okay. Previsit nurse first, to make sure there are no clinical issues. Make sure copy of the CT is in our records. Thanks

## 2015-11-18 NOTE — Telephone Encounter (Signed)
Referral from Dr. Joylene Draft. Pt referred for possible gastric mass on recent CT scan. Referring for possible EGD. Please advise if pt needs OV first of direct EGD.

## 2015-11-26 ENCOUNTER — Encounter: Payer: Self-pay | Admitting: Gastroenterology

## 2015-11-26 ENCOUNTER — Ambulatory Visit (INDEPENDENT_AMBULATORY_CARE_PROVIDER_SITE_OTHER): Payer: Medicare Other | Admitting: Gastroenterology

## 2015-11-26 ENCOUNTER — Telehealth: Payer: Self-pay | Admitting: *Deleted

## 2015-11-26 VITALS — BP 120/70 | HR 74 | Ht 67.5 in | Wt 156.0 lb

## 2015-11-26 DIAGNOSIS — R938 Abnormal findings on diagnostic imaging of other specified body structures: Secondary | ICD-10-CM | POA: Diagnosis not present

## 2015-11-26 DIAGNOSIS — I480 Paroxysmal atrial fibrillation: Secondary | ICD-10-CM | POA: Diagnosis not present

## 2015-11-26 DIAGNOSIS — Z7901 Long term (current) use of anticoagulants: Secondary | ICD-10-CM | POA: Insufficient documentation

## 2015-11-26 DIAGNOSIS — R9389 Abnormal findings on diagnostic imaging of other specified body structures: Secondary | ICD-10-CM

## 2015-11-26 DIAGNOSIS — I482 Chronic atrial fibrillation, unspecified: Secondary | ICD-10-CM

## 2015-11-26 HISTORY — DX: Chronic atrial fibrillation, unspecified: I48.20

## 2015-11-26 HISTORY — DX: Abnormal findings on diagnostic imaging of other specified body structures: R93.89

## 2015-11-26 HISTORY — DX: Paroxysmal atrial fibrillation: I48.0

## 2015-11-26 HISTORY — DX: Long term (current) use of anticoagulants: Z79.01

## 2015-11-26 NOTE — Progress Notes (Signed)
Agree with initial assessment and plans as outlined 

## 2015-11-26 NOTE — Progress Notes (Deleted)
     11/26/2015 Brian Klein [id number] 1946/05/29   HISTORY OF PRESENT ILLNESS: ***         Past Medical History  Diagnosis Date  . AVD (aortic valve disease)     with bicupsid aortic valve  . MVP (mitral valve prolapse)   . Heart murmur   . Osteogenic sarcoma ([place]) 1976    jaw bone  . Hyperlipidemia   . Atrial fibrillation [facility])    Past Surgical History  Procedure Laterality Date  . Transthoracic echocardiogram  07/18/2002    ef 71%  . Mandible surgery  1976    right removal -- cancer  . Mandible surgery  1987    right reconstruction  . Eyelid laceration repair  2008    left and right    reports that he has never smoked. He has never used smokeless tobacco. He reports that he does not drink alcohol or use illicit drugs. family history includes Heart attack in his father; Hypertension in his father and sister; Stroke in his mother. There is no history of Colon cancer. No Known Allergies    Outpatient Encounter Prescriptions as of 11/26/2015  Medication Sig  . amoxicillin (AMOXIL) 500 MG capsule Take 4 tablets by mouth 1 hour prior to dentist procedure  . Cholecalciferol (VITAMIN D) 2000 UNITS tablet Take 2,000 Units by mouth daily.    . Coenzyme Q10 (CO Q 10 PO) Take 200 mg by mouth daily.  Marland Kitchen ezetimibe-simvastatin (VYTORIN) 10-40 MG per tablet Take 1 tablet by mouth at bedtime.    . fexofenadine (ALLEGRA) 60 MG tablet Take 60 mg by mouth as needed for allergies or rhinitis.   . fish oil-omega-3 fatty acids 1000 MG capsule Take 1 g by mouth daily.   . flecainide (TAMBOCOR) 50 MG tablet Take 1 tablet (50 mg total) by mouth 2 (two) times daily.  Marland Kitchen guaiFENesin (MUCINEX) 600 MG 12 hr tablet Take 600 mg by mouth 2 (two) times daily as needed (cough).   . meclizine (ANTIVERT) 25 MG tablet Take 25 mg by mouth daily as needed. FOR DIZZINESS  . metoprolol succinate (TOPROL-XL) 25 MG 24 hr tablet TAKE 1/2 TABLET BY MOUTH DAILY  . sildenafil (VIAGRA) 50 MG tablet Take 50  mg by mouth daily as needed for erectile dysfunction (erectile dysfunction).  Alveda Reasons 20 MG TABS tablet TAKE 1 TABLET BY MOUTH DAILY  . [DISCONTINUED] Coenzyme Q10 (CO Q 10) 100 MG CAPS Take 1 capsule by mouth daily.    No facility-administered encounter medications on file as of 11/26/2015.     REVIEW OF SYSTEMS  : All other systems reviewed and negative except where noted in the History of Present Illness.   PHYSICAL EXAM: BP 120/70 mmHg  Pulse 74  Ht 5' 7.5" (1.715 m)  Wt 156 lb (70.761 kg)  BMI 24.06 kg/m2 General: Well developed white male in no acute distress Head: Normocephalic and atraumatic Eyes:  sclerae anicteric,conjunctive pink. Ears: Normal auditory acuity Neck: Supple, no masses.  Lungs: Clear throughout to auscultation Heart: Regular rate and rhythm Abdomen: Soft, nontender, non distended. No masses or hepatomegaly noted. Normal bowel sounds Rectal: *** Musculoskeletal: Symmetrical with no gross deformities  Skin: No lesions on visible extremities Extremities: No edema  Neurological: Alert oriented x 4, grossly nonfocal Cervical Nodes:  No significant cervical adenopathy Psychological:  Alert and cooperative. Normal mood and affect  ASSESSMENT AND PLAN:    CC:  Crist Infante, MD

## 2015-11-26 NOTE — Telephone Encounter (Signed)
11/26/2015   RE: Brian Klein DOB: 05-22-1946 MRN: [id number]   Dear Burtis Junes NP,    We have scheduled the above patient for an endoscopic procedure. Our records show that he is on anticoagulation therapy.   Please advise as to how long the patient may come off his therapy of Xarelto prior to the procedure, which is scheduled for 12-12-2015.  Please fax back/ or route the completed form to [facility] at [phone].   Sincerely,    Alonza Bogus PA-C

## 2015-11-26 NOTE — Telephone Encounter (Signed)
Would hold Xarelto for 2 days prior to procedure and restart when ok with GI.

## 2015-11-26 NOTE — Patient Instructions (Signed)
You have been scheduled for an endoscopy. Please follow written instructions given to you at your visit today. If you use inhalers (even only as needed), please bring them with you on the day of your procedure. Your physician has requested that you go to www.startemmi.com and enter the access code given to you at your visit today. This web site gives a general overview about your procedure. However, you should still follow specific instructions given to you by our office regarding your preparation for the procedure.  We will call you once we get our response for the Xarelto clearance.

## 2015-11-26 NOTE — Progress Notes (Signed)
11/26/2015 Brian Klein [id number] February 22, 1946   HISTORY OF PRESENT ILLNESS:  This is a 70 year old male who is known to Dr. Henrene Pastor for colonoscopies.  He presents to our office today at the request of his PCP, Dr. Joylene Draft, for abnormal CT scan. He was complaining of right sided abdominal/pelvic/hip pain apparently and CT scan of the abdomen and pelvis with contrast was ordered.  This did not show any cause for his pain and his pain is now resolved and was thought to be possibly musculoskeletal in nature. CT scan did show a focal 1.5 cm of rounded apparent enhancement of the anterior wall of the stomach for which gastric mass could not be excluded, however, may represent oral contrast mixed with ingested material. He is sent to [place] to consider endoscopy. Patient denies any associated complaints. He denies any upper abdominal pain, early satiety, nausea, vomiting, weight loss.  He is on Xarelto for paroxysmal atrial fibrillation. He is to see Dr. Mare Ferrari who is obviously now retired and most recently saw Truitt Merle, NP.   Past Medical History  Diagnosis Date  . AVD (aortic valve disease)     with bicupsid aortic valve  . MVP (mitral valve prolapse)   . Heart murmur   . 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 Hyperlipidemia   . Atrial fibrillation [facility])    Past Surgical History  Procedure Laterality Date  . Transthoracic echocardiogram  07/18/2002    ef 71%  . Mandible surgery  1976    right removal -- cancer  . Mandible surgery  1987    right reconstruction  . Eyelid laceration repair  2008    left and right    reports that he has never smoked. He has never used smokeless tobacco. He reports that he does not drink alcohol or use illicit drugs. family history includes Heart attack in his father; Hypertension in his father and sister; Stroke in his mother. There is no history of Colon cancer. No Known Allergies    Outpatient Encounter Prescriptions as of 11/26/2015    Medication Sig  . amoxicillin (AMOXIL) 500 MG capsule Take 4 tablets by mouth 1 hour prior to dentist procedure  . Cholecalciferol (VITAMIN D) 2000 UNITS tablet Take 2,000 Units by mouth daily.    . Coenzyme Q10 (CO Q 10 PO) Take 200 mg by mouth daily.  Marland Kitchen ezetimibe-simvastatin (VYTORIN) 10-40 MG per tablet Take 1 tablet by mouth at bedtime.    . fexofenadine (ALLEGRA) 60 MG tablet Take 60 mg by mouth as needed for allergies or rhinitis.   . fish oil-omega-3 fatty acids 1000 MG capsule Take 1 g by mouth daily.   . flecainide (TAMBOCOR) 50 MG tablet Take 1 tablet (50 mg total) by mouth 2 (two) times daily.  Marland Kitchen guaiFENesin (MUCINEX) 600 MG 12 hr tablet Take 600 mg by mouth 2 (two) times daily as needed (cough).   . meclizine (ANTIVERT) 25 MG tablet Take 25 mg by mouth daily as needed. FOR DIZZINESS  . metoprolol succinate (TOPROL-XL) 25 MG 24 hr tablet TAKE 1/2 TABLET BY MOUTH DAILY  . sildenafil (VIAGRA) 50 MG tablet Take 50 mg by mouth daily as needed for erectile dysfunction (erectile dysfunction).  Alveda Reasons 20 MG TABS tablet TAKE 1 TABLET BY MOUTH DAILY  . [DISCONTINUED] Coenzyme Q10 (CO Q 10) 100 MG CAPS Take 1 capsule by mouth daily.    No facility-administered encounter medications on file as of 11/26/2015.  REVIEW OF SYSTEMS  : All other systems reviewed and negative except where noted in the History of Present Illness.   PHYSICAL EXAM: BP 120/70 mmHg  Pulse 74  Ht 5' 7.5" (1.715 m)  Wt 156 lb (70.761 kg)  BMI 24.06 kg/m2 General: Well developed white male in no acute distress Head: Normocephalic and atraumatic Eyes:  Sclerae anicteric, conjunctiva pink. Ears: Normal auditory acuity Lungs: Clear throughout to auscultation Heart: Regular rate and rhythm Abdomen: Soft, non-distended.  Normal bowel sounds.  Non-tender. Musculoskeletal: Symmetrical with no gross deformities  Skin: No lesions on visible extremities Extremities: No edema  Neurological: Alert oriented x 4,  grossly non-focal Psychological:  Alert and cooperative. Normal mood and affect  ASSESSMENT AND PLAN: -Abnormal CT scan showing possible gastric mass. We'll schedule for EGD with Dr. Henrene Pastor for further evaluation.  -Chronic anticoagulation with Xarelto for paroxysmal atrial fibrillation:  Will hold Xarelto for 1-2 days prior to endoscopic procedures - will instruct when and how to resume after procedure. Benefits and risks of procedure explained including risks of bleeding, perforation, infection, missed lesions, reactions to medications and possible need for hospitalization and surgery for complications. Additional rare but real risk of stroke or other vascular clotting events off Xarelto also explained and need to seek urgent help if any signs of these problems occur. Will communicate by phone or EMR with patient's prescribing provider, Dr. Haydee Monica NP, to confirm that holding Xarelto is reasonable in this case.    CC:  Crist Infante, MD

## 2015-11-26 NOTE — Telephone Encounter (Signed)
Advised the patient we heard from Brian Merle NP he can hold his Xarelto 2 days prior to the procedure date of 12-12-2015.  He can resume it on 12-13-2015.  Patient verbalized understanding of the instructions.

## 2015-12-01 ENCOUNTER — Encounter: Payer: Self-pay | Admitting: Internal Medicine

## 2015-12-09 NOTE — Telephone Encounter (Signed)
See note from 11-26-2015.

## 2015-12-12 ENCOUNTER — Ambulatory Visit (AMBULATORY_SURGERY_CENTER): Payer: Medicare Other | Admitting: Internal Medicine

## 2015-12-12 ENCOUNTER — Encounter: Payer: Self-pay | Admitting: Internal Medicine

## 2015-12-12 VITALS — BP 103/57 | HR 72 | Temp 98.2°F | Resp 12 | Wt 156.0 lb

## 2015-12-12 DIAGNOSIS — R9389 Abnormal findings on diagnostic imaging of other specified body structures: Secondary | ICD-10-CM

## 2015-12-12 DIAGNOSIS — R938 Abnormal findings on diagnostic imaging of other specified body structures: Secondary | ICD-10-CM

## 2015-12-12 DIAGNOSIS — R933 Abnormal findings on diagnostic imaging of other parts of digestive tract: Secondary | ICD-10-CM | POA: Diagnosis not present

## 2015-12-12 MED ORDER — SODIUM CHLORIDE 0.9 % IV SOLN: 500.0000 mL | INTRAVENOUS | Status: DC

## 2015-12-12 NOTE — Patient Instructions (Signed)
YOU HAD AN ENDOSCOPIC PROCEDURE TODAY AT [facility]:   Refer to the procedure report that was given to you for any specific questions about what was found during the examination.  If the procedure report does not answer your questions, please call your gastroenterologist to clarify.  If you requested that your care partner not be given the details of your procedure findings, then the procedure report has been included in a sealed envelope for you to review at your convenience later.  YOU SHOULD EXPECT: Some feelings of bloating in the abdomen. Passage of more gas than usual.  Walking can help get rid of the air that was put into your GI tract during the procedure and reduce the bloating. If you had a lower endoscopy (such as a colonoscopy or flexible sigmoidoscopy) you may notice spotting of blood in your stool or on the toilet paper. If you underwent a bowel prep for your procedure, you may not have a normal bowel movement for a few days.  Please Note:  You might notice some irritation and congestion in your nose or some drainage.  This is from the oxygen used during your procedure.  There is no need for concern and it should clear up in a day or so.  SYMPTOMS TO REPORT IMMEDIATELY:    Following upper endoscopy (EGD)  Vomiting of blood or coffee ground material  New chest pain or pain under the shoulder blades  Painful or persistently difficult swallowing  New shortness of breath  Fever of 100F or higher  Black, tarry-looking stools  For urgent or emergent issues, a gastroenterologist can be reached at any hour by calling [phone].   DIET: Your first meal following the procedure should be a small meal and then it is ok to progress to your normal diet. Heavy or fried foods are harder to digest and may make you feel nauseous or bloated.  Likewise, meals heavy in dairy and vegetables can increase bloating.  Drink plenty of fluids but you should avoid alcoholic beverages  for 24 hours.  ACTIVITY:  You should plan to take it easy for the rest of today and you should NOT DRIVE or use heavy machinery until tomorrow (because of the sedation medicines used during the test).    FOLLOW UP: Our staff will call the number listed on your records the next business day following your procedure to check on you and address any questions or concerns that you may have regarding the information given to you following your procedure. If we do not reach you, we will leave a message.  However, if you are feeling well and you are not experiencing any problems, there is no need to return our call.  We will assume that you have returned to your regular daily activities without incident.  If any biopsies were taken you will be contacted by phone or by letter within the next 1-3 weeks.  Please call us at [phone] if you have not heard about the biopsies in 3 weeks.    SIGNATURES/CONFIDENTIALITY: You and/or your care partner have signed paperwork which will be entered into your electronic medical record.  These signatures attest to the fact that that the information above on your After Visit Summary has been reviewed and is understood.  Full responsibility of the confidentiality of this discharge information lies with you and/or your care-partner.  Normal exam.  Resume xarelto today at prior dose.

## 2015-12-12 NOTE — Op Note (Signed)
[facility] Patient Name: Brian Klein Procedure Date: 12/12/2015 7:55 AM MRN: [id number] Endoscopist: Docia Chuck. Henrene Pastor , MD Age: 70 Referring MD:  Date of Birth: 1946-04-14 Gender: Male Procedure:                Upper GI endoscopy Indications:              Abnormal CT of the GI tract. ? small anterior                            gastic mass Medicines:                Monitored Anesthesia Care Procedure:                Pre-Anesthesia Assessment:                           - Prior to the procedure, a History and Physical                            was performed, and patient medications and                            allergies were reviewed. The patient's tolerance of                            previous anesthesia was also reviewed. The risks                            and benefits of the procedure and the sedation                            options and risks were discussed with the patient.                            All questions were answered, and informed consent                            was obtained. Prior Anticoagulants: The patient has                            taken Xarelto (rivaroxaban), last dose was 3 days                            prior to procedure. ASA Grade Assessment: III - A                            patient with severe systemic disease. After                            reviewing the risks and benefits, the patient was                            deemed in satisfactory condition to undergo the  procedure.                           After obtaining informed consent, the endoscope was                            passed under direct vision. Throughout the                            procedure, the patient's blood pressure, pulse, and                            oxygen saturations were monitored continuously. The                            Model GIF-[id number]) scope was introduced                            through the mouth, and  advanced to the second part                            of duodenum. The upper GI endoscopy was                            accomplished without difficulty. The patient                            tolerated the procedure well. Scope In: Scope Out: Findings:                 The esophagus was normal.                           The stomach was normal.                           The examined duodenum was normal.                           The cardia and gastric fundus were normal on                            retroflexion. Complications:            No immediate complications. Estimated Blood Loss:     Estimated blood loss: none. Impression:               - Normal esophagus.                           - Normal stomach.                           - Normal examined duodenum.                           - No specimens collected. Recommendation:           - Patient has a contact number available  for                            emergencies. The signs and symptoms of potential                            delayed complications were discussed with the                            patient. Return to normal activities tomorrow.                            Written discharge instructions were provided to the                            patient.                           - Resume previous diet.                           - Continue present medications.                           - Resume Xarelto (rivaroxaban) at prior dose today. Docia Chuck. Henrene Pastor, MD 12/12/2015 8:29:27 AM This report has been signed electronically. CC Letter to:             Mark A. Perini, MD

## 2015-12-12 NOTE — Progress Notes (Signed)
Report given to PACU RN, vss 

## 2015-12-15 ENCOUNTER — Telehealth: Payer: Self-pay

## 2015-12-15 NOTE — Telephone Encounter (Signed)
Left a message at [phone] for the pt to call us back if any questions or concerns. maw

## 2015-12-22 ENCOUNTER — Telehealth: Payer: Self-pay

## 2015-12-22 NOTE — Telephone Encounter (Signed)
Prior auth for Xarelto 20mg submitted to [facility]. 

## 2015-12-23 ENCOUNTER — Telehealth: Payer: Self-pay

## 2015-12-23 NOTE — Telephone Encounter (Signed)
Xarelto approved through 07/11/2016. [id number]. Local pharmacy notified.

## 2016-01-20 ENCOUNTER — Encounter: Payer: Self-pay | Admitting: Nurse Practitioner

## 2016-01-20 ENCOUNTER — Ambulatory Visit (INDEPENDENT_AMBULATORY_CARE_PROVIDER_SITE_OTHER): Payer: Medicare Other | Admitting: Nurse Practitioner

## 2016-01-20 VITALS — BP 110/60 | HR 63 | Ht 67.0 in | Wt 155.0 lb

## 2016-01-20 DIAGNOSIS — I480 Paroxysmal atrial fibrillation: Secondary | ICD-10-CM

## 2016-01-20 NOTE — Progress Notes (Signed)
CARDIOLOGY OFFICE NOTE  Date:  01/20/2016    Brian Klein Date of Birth: Jul 03, 1946 Medical Record [id number]  PCP:  Jerlyn Ly, MD  Cardiologist:  Former patient of Dr. Sherryl Barters - following with me   Chief Complaint  Patient presents with  . Aortic Stenosis  . Atrial Fibrillation    6 month check - former patient of Dr. Sherryl Barters    History of Present Illness: Brian Klein is a 70 y.o. male who presents today for a 6 month check. Former patient of Dr. Sherryl Barters. He is following with me.   He has had AF and is on Xarelto and Flecainide. The patient has a history of bicuspid aortic valve. Last echocardiogram 08/2015 showed mild aortic stenosis and mild aortic insufficiency and normal LV function.  Other issues include HLD and ED.  Last seen in February and was doing well.   Comes back today. Here with his wife today. No real problems or complaints. Asking if his Flecainide "is working". He was at Dr. Silvestre Mesi office about a month ago - noted to be in AF. He is not symptomatic. No chest pain. Not short of breath. No palpitations. Exercising without issue on a regular basis. Energy level is fine. He continues on his Xarelto.   Past Medical History  Diagnosis Date  . AVD (aortic valve disease)     with bicupsid aortic valve  . MVP (mitral valve prolapse)   . Heart murmur   . 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 Hyperlipidemia   . Atrial fibrillation [facility])     Past Surgical History  Procedure Laterality Date  . Transthoracic echocardiogram  07/18/2002    ef 71%  . Mandible surgery  1976    right removal -- cancer  . Mandible surgery  1987    right reconstruction  . Eyelid laceration repair  2008    left and right     Medications: Current Outpatient Prescriptions  Medication Sig Dispense Refill  . amoxicillin (AMOXIL) 500 MG capsule Take 4 tablets by mouth 1 hour prior to dentist procedure 4 capsule 3  . Cholecalciferol (VITAMIN D) 2000  UNITS tablet Take 2,000 Units by mouth daily.      . Coenzyme Q10 (CO Q 10 PO) Take 200 mg by mouth daily.    Marland Kitchen ezetimibe-simvastatin (VYTORIN) 10-40 MG per tablet Take 1 tablet by mouth at bedtime.      . fexofenadine (ALLEGRA) 60 MG tablet Take 60 mg by mouth as needed for allergies or rhinitis.     . fish oil-omega-3 fatty acids 1000 MG capsule Take 1 g by mouth daily.     . flecainide (TAMBOCOR) 50 MG tablet Take 1 tablet (50 mg total) by mouth 2 (two) times daily. 180 tablet 2  . guaiFENesin (MUCINEX) 600 MG 12 hr tablet Take 600 mg by mouth 2 (two) times daily as needed (cough).     . meclizine (ANTIVERT) 25 MG tablet Take 25 mg by mouth daily as needed. FOR DIZZINESS  1  . metoprolol succinate (TOPROL-XL) 25 MG 24 hr tablet TAKE 1/2 TABLET BY MOUTH DAILY 45 tablet 6  . sildenafil (VIAGRA) 50 MG tablet Take 50 mg by mouth daily as needed for erectile dysfunction (erectile dysfunction).    Alveda Reasons 20 MG TABS tablet TAKE 1 TABLET BY MOUTH DAILY 90 tablet 2   No current facility-administered medications for this visit.    Allergies: No Known Allergies  Social  History: The patient  reports that he has never smoked. He has never used smokeless tobacco. He reports that he does not drink alcohol or use illicit drugs.   Family History: The patient's family history includes Heart attack in his father; Hypertension in his father and sister; Stroke in his mother. There is no history of Colon cancer.   Review of Systems: Please see the history of present illness.   Otherwise, the review of systems is positive for none.   All other systems are reviewed and negative.   Physical Exam: VS:  BP 110/60 mmHg  Pulse 63  Ht 5\' 7"  (1.702 m)  Wt 155 lb (70.308 kg)  BMI 24.27 kg/m2 .  BMI Body mass index is 24.27 kg/(m^2).  Wt Readings from Last 3 Encounters:  01/20/16 155 lb (70.308 kg)  12/12/15 156 lb (70.761 kg)  11/26/15 156 lb (70.761 kg)    General: Pleasant. Well developed, well  nourished and in no acute distress.  HEENT: Normal but has lower facial deformity. Neck: Supple, no JVD, carotid bruits, or masses noted.  Cardiac: Irregular irregular rhythm today. His rate is fine. Soft outflow murmur. No edema.  Respiratory:  Lungs are clear to auscultation bilaterally with normal work of breathing.  GI: Soft and nontender.  MS: No deformity or atrophy. Gait and ROM intact. Skin: Warm and dry. Color is normal.  Neuro:  Strength and sensation are intact and no gross focal deficits noted.  Psych: Alert, appropriate and with normal affect.   LABORATORY DATA:  EKG:  EKG is ordered today. This demonstrates recurrent atrial fib with a controlled VR of 53.   Lab Results  Component Value Date   WBC 5.1 12/17/2011   HGB 13.0 12/17/2011   HCT 38.9* 12/17/2011   PLT 192.0 12/17/2011   GLUCOSE 97 10/04/2012   ALT 20 12/17/2011   AST 25 12/17/2011   NA 139 10/04/2012   K 4.4 10/04/2012   CL 105 10/04/2012   CREATININE 1.0 10/04/2012   BUN 14 10/04/2012   CO2 27 10/04/2012   TSH 2.28 12/17/2011    BNP (last 3 results) No results for input(s): BNP in the last 8760 hours.  ProBNP (last 3 results) No results for input(s): PROBNP in the last 8760 hours.   Other Studies Reviewed Today:  Echo Study Conclusions from 08/2015  - Left ventricle: The cavity size was normal. Wall thickness was  normal. Systolic function was normal. The estimated ejection  fraction was in the range of 55% to 60%. Wall motion was normal;  there were no regional wall motion abnormalities. Left  ventricular diastolic function parameters were normal. - Aortic valve: Valve mobility was restricted. There was mild  stenosis. There was mild regurgitation. - Mitral valve: Calcified annulus. Mildly thickened leaflets . - Left atrium: The atrium was mildly dilated.  Impressions:  - Normal LV function; calcified aortic valve with probable fusion  of right and left cusps with  functionally bicuspid aortic valve;  mild AS and mild AI.  Assessment/Plan: 1. PAF - managed with AAD therapy and anticoagulation - he is doing well clinically. He is back in atrial fib - duration unknown - ?if he is going back and forth. Options for treatment would be to stop the Flecainide and manage with rate control/anticoagulation, could consider increasing the Flecainide and consider cardioversion. Could consider sending for ablation. No matter what, he is committed to long term anticoagulation. He is totally asymptomatic and I don't think any of this options  will make him feel any better since he does not feel bad at all. We have agreed to place event monitor to see if he is going in and out. Will see back afterwards for further discussion.   2. Bicuspid AV/AS - needs echo updated.   3. HLD - on statin - has labs with PCP  4. Chronic anticoagulation with Xarelto - no problems noted. His labs are checked by PCP.  Current medicines are reviewed with the patient today.  The patient does not have concerns regarding medicines other than what has been noted above.  The following changes have been made:  See above.  Labs/ tests ordered today include:    Orders Placed This Encounter  Procedures  . Cardiac event monitor  . EKG 12-Lead     Disposition:   FU with me in 2 months.   Patient is agreeable to this plan and will call if any problems develop in the interim.   Signed: Burtis Junes, RN, ANP-C 01/20/2016 9:55 AM  [facility] [address] [place], [place]  [address] Phone: [phone] Fax: [phone]

## 2016-01-20 NOTE — Patient Instructions (Addendum)
We will be checking the following labs today - NONE   Medication Instructions:    Continue with your current medicines.     Testing/Procedures To Be Arranged:  Event monitor to be placed after your vacation  Follow-Up:   See me around the first or second week of September    Other Special Instructions:   N/A    If you need a refill on your cardiac medications before your next appointment, please call your pharmacy.   Call the [place] office at [phone] if you have any questions, problems or concerns.

## 2016-01-31 ENCOUNTER — Other Ambulatory Visit: Payer: Self-pay | Admitting: Nurse Practitioner

## 2016-02-09 ENCOUNTER — Ambulatory Visit (INDEPENDENT_AMBULATORY_CARE_PROVIDER_SITE_OTHER): Payer: Medicare Other

## 2016-02-09 DIAGNOSIS — I480 Paroxysmal atrial fibrillation: Secondary | ICD-10-CM

## 2016-02-10 ENCOUNTER — Telehealth: Payer: Self-pay | Admitting: Internal Medicine

## 2016-02-10 NOTE — Telephone Encounter (Signed)
Pt's appointment is 9/11 send to [facility] to [place]

## 2016-02-10 NOTE — Telephone Encounter (Signed)
He was in atrial fib at his visit with me.   He should have followup with me after his monitor is complete.   No change with current plan for now.

## 2016-02-10 NOTE — Telephone Encounter (Signed)
Received a call from [place] [place] reporting patient showing Afib yesterday, 7/31, rates 60-80 bpm.  They will fax report to our office. Will notify ordering provider.

## 2016-03-22 ENCOUNTER — Ambulatory Visit (INDEPENDENT_AMBULATORY_CARE_PROVIDER_SITE_OTHER): Payer: Medicare Other | Admitting: Nurse Practitioner

## 2016-03-22 ENCOUNTER — Encounter: Payer: Self-pay | Admitting: Nurse Practitioner

## 2016-03-22 VITALS — BP 104/70 | HR 76 | Ht 67.0 in | Wt 153.0 lb

## 2016-03-22 DIAGNOSIS — Z79899 Other long term (current) drug therapy: Secondary | ICD-10-CM

## 2016-03-22 DIAGNOSIS — E7800 Pure hypercholesterolemia, unspecified: Secondary | ICD-10-CM

## 2016-03-22 DIAGNOSIS — I480 Paroxysmal atrial fibrillation: Secondary | ICD-10-CM

## 2016-03-22 DIAGNOSIS — Q231 Congenital insufficiency of aortic valve: Secondary | ICD-10-CM

## 2016-03-22 NOTE — Patient Instructions (Addendum)
We will be checking the following labs today - NONE   Medication Instructions:    Continue with your current medicines.     Testing/Procedures To Be Arranged:  N/A  Follow-Up:   See Dr. Cristopher Peru with EP  See me in 6 months     Other Special Instructions:   N/A    If you need a refill on your cardiac medications before your next appointment, please call your pharmacy.   Call the [place] office at [phone] if you have any questions, problems or concerns.

## 2016-03-22 NOTE — Progress Notes (Signed)
CARDIOLOGY OFFICE NOTE  Date:  03/22/2016    Brian Klein Date of Birth: 1946/04/04 Medical Record [id number]  PCP:  Jerlyn Ly, MD  Cardiologist:  Servando Snare  Chief Complaint  Patient presents with  . Atrial Fibrillation    Follow up visit    History of Present Illness: Brian Klein is a 70 y.o. male who presents today for a follow up visit. Former patient of Dr. Sherryl Barters. He is following with me going forward.   He has had PAF and is on Xarelto and Flecainide. The patient has a history of bicuspid aortic valve. Last echocardiogram 08/2015 showed mild aortic stenosis and mild aortic insufficiency and normal LV function.  Other issues include HLD and ED.  Last seen in February and was doing well.   I saw him back in July - was doing well but was back in AF and probably had been for at least a month prior to that visit. Asymptomatic. We discussed his options. Event monitor was placed.   Comes back today. Here with his wife today. He has had some technical/adverse issues with the monitor - had to send it back - then sent the wrong one - then developed a rash. Did not wear for the full 30 days but close to it. Final report pending. It does show bradycardia/tachycardia as well as pauses. His AF burden was only 5% noted.   He still feels ok. Not dizzy or lightheaded. No syncope. No chest pain. Wife thinks he is not as refreshed as he should be in the mornings. Does not snore but she notes he will stop breathing and have slower breathing at night. No known OSA noted.   Past Medical History:  Diagnosis Date  . Atrial fibrillation ([place])   . AVD (aortic valve disease)    with bicupsid aortic valve  . Heart murmur   . Hyperlipidemia   . MVP (mitral valve prolapse)   . 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 EYELID LACERATION REPAIR  2008   left and right  . [facility]   right removal --  cancer  . MANDIBLE SURGERY  1987   right reconstruction  . TRANSTHORACIC ECHOCARDIOGRAM  07/18/2002   ef 71%     Medications: Current Outpatient Prescriptions  Medication Sig Dispense Refill  . amoxicillin (AMOXIL) 500 MG capsule Take 4 tablets by mouth 1 hour prior to dentist procedure 4 capsule 3  . Cholecalciferol (VITAMIN D) 2000 UNITS tablet Take 2,000 Units by mouth daily.      . Coenzyme Q10 (CO Q 10 PO) Take 200 mg by mouth daily.    Marland Kitchen ezetimibe-simvastatin (VYTORIN) 10-40 MG per tablet Take 1 tablet by mouth at bedtime.      . fexofenadine (ALLEGRA) 60 MG tablet Take 60 mg by mouth as needed for allergies or rhinitis.     . fish oil-omega-3 fatty acids 1000 MG capsule Take 1 g by mouth daily.     . flecainide (TAMBOCOR) 50 MG tablet Take 1 tablet (50 mg total) by mouth 2 (two) times daily. 180 tablet 2  . guaiFENesin (MUCINEX) 600 MG 12 hr tablet Take 600 mg by mouth 2 (two) times daily as needed (cough).     . meclizine (ANTIVERT) 25 MG tablet Take 25 mg by mouth daily as needed. FOR DIZZINESS  1  . metoprolol succinate (TOPROL-XL) 25 MG 24 hr tablet  TAKE 1/2 TABLET BY MOUTH DAILY 45 tablet 0  . sildenafil (VIAGRA) 50 MG tablet Take 50 mg by mouth daily as needed for erectile dysfunction (erectile dysfunction).    Alveda Reasons 20 MG TABS tablet TAKE 1 TABLET BY MOUTH DAILY 90 tablet 2  . metroNIDAZOLE (METROCREAM) 0.75 % cream APP AND GENTLY MASSAGE INTO AFFECTED AREA BID  11   No current facility-administered medications for this visit.     Allergies: No Known Allergies  Social History: The patient  reports that he has never smoked. He has never used smokeless tobacco. He reports that he does not drink alcohol or use drugs.   Family History: The patient's family history includes Heart attack in his father; Hypertension in his father and sister; Stroke in his mother.   Review of Systems: Please see the history of present illness.   Otherwise, the review of systems is  positive for none.   All other systems are reviewed and negative.   Physical Exam: VS:  BP 104/70   Pulse 76   Ht 5\' 7"  (1.702 m)   Wt 153 lb (69.4 kg)   SpO2 100% Comment: at rest  BMI 23.96 kg/m  .  BMI Body mass index is 23.96 kg/m.  Wt Readings from Last 3 Encounters:  03/22/16 153 lb (69.4 kg)  01/20/16 155 lb (70.3 kg)  12/12/15 156 lb (70.8 kg)    General: Pleasant. Well developed, well nourished and in no acute distress.   HEENT: Normal but with facial deformity noted.  Neck: Supple, no JVD, carotid bruits, or masses noted.  Cardiac: Irregular irregular rhythm. Rate is ok.  No murmurs, rubs, or gallops. No edema.  Respiratory:  Lungs are clear to auscultation bilaterally with normal work of breathing.  GI: Soft and nontender.  MS: No deformity or atrophy. Gait and ROM intact.  Skin: Warm and dry. Color is normal.  Neuro:  Strength and sensation are intact and no gross focal deficits noted.  Psych: Alert, appropriate and with normal affect.   LABORATORY DATA:  EKG:  EKG is not ordered today.  Lab Results  Component Value Date   WBC 5.1 12/17/2011   HGB 13.0 12/17/2011   HCT 38.9 (L) 12/17/2011   PLT 192.0 12/17/2011   GLUCOSE 97 10/04/2012   ALT 20 12/17/2011   AST 25 12/17/2011   NA 139 10/04/2012   K 4.4 10/04/2012   CL 105 10/04/2012   CREATININE 1.0 10/04/2012   BUN 14 10/04/2012   CO2 27 10/04/2012   TSH 2.28 12/17/2011    BNP (last 3 results) No results for input(s): BNP in the last 8760 hours.  ProBNP (last 3 results) No results for input(s): PROBNP in the last 8760 hours.   Other Studies Reviewed Today:  Echo Study Conclusions from 08/2015  - Left ventricle: The cavity size was normal. Wall thickness was  normal. Systolic function was normal. The estimated ejection  fraction was in the range of 55% to 60%. Wall motion was normal;  there were no regional wall motion abnormalities. Left  ventricular diastolic function parameters were  normal. - Aortic valve: Valve mobility was restricted. There was mild  stenosis. There was mild regurgitation. - Mitral valve: Calcified annulus. Mildly thickened leaflets . - Left atrium: The atrium was mildly dilated.  Impressions:  - Normal LV function; calcified aortic valve with probable fusion  of right and left cusps with functionally bicuspid aortic valve;  mild AS and mild AI.  Assessment/Plan: 1. PAF -  managed with AAD therapy and anticoagulation - he is doing well clinically. He is back in atrial fib - duration unknown - his event monitor shows that he continues to go in and out - 5% burden noted (but seems to always be in AF when we see). Preliminary report noted. Also with some pauses noted - he feels like he was awake during these times but still in the bed.  I suspect he is having more tachy brady now - ? Is what do we do - he is asymptomatic and would like to keep him asymptomatic. Pacemaker may be needed at some point. If stop his Metoprolol may have worsening tachycardia. Would like for EP to see - refer to Dr. Lovena Le for his evaluation.   2. Bicuspid AV/AS - echo from February of 2017 noted - will need updating in February of 2018  3. HLD - on statin - has labs with PCP  4. Chronic anticoagulation with Xarelto - no problems noted. His labs are checked by PCP.  Current medicines are reviewed with the patient today.  The patient does not have concerns regarding medicines other than what has been noted above.  The following changes have been made:  See above.  Labs/ tests ordered today include:    Orders Placed This Encounter  Procedures  . Ambulatory referral to Cardiac Electrophysiology     Disposition:   FU with me in 6 months. I am referring him to EP.   Patient is agreeable to this plan and will call if any problems develop in the interim.   Signed: Burtis Junes, RN, ANP-C 03/22/2016 9:34 AM  [facility] [address] [place], [place]  [address] Phone: [phone] Fax: [phone]

## 2016-03-26 ENCOUNTER — Encounter: Payer: Self-pay | Admitting: Physician Assistant

## 2016-03-29 ENCOUNTER — Encounter: Payer: Self-pay | Admitting: Internal Medicine

## 2016-03-29 ENCOUNTER — Ambulatory Visit (INDEPENDENT_AMBULATORY_CARE_PROVIDER_SITE_OTHER): Payer: Medicare Other | Admitting: Internal Medicine

## 2016-03-29 VITALS — BP 112/80 | HR 84 | Ht 67.0 in | Wt 153.8 lb

## 2016-03-29 DIAGNOSIS — I480 Paroxysmal atrial fibrillation: Secondary | ICD-10-CM

## 2016-03-29 NOTE — Patient Instructions (Addendum)
Medication Instructions:    Your physician recommends that you continue on your current medications as directed. Please refer to the Current Medication list given to you today.  - If you need a refill on your cardiac medications before your next appointment, please call your pharmacy.   Labwork:  None ordered  Testing/Procedures:  None ordered  Follow-Up:  Your physician wants you to follow-up in: 1 year with Dr. Taylor.  You will receive a reminder letter in the mail two months in advance. If you don't receive a letter, please call our office to schedule the follow-up appointment.  Thank you for choosing [facility]!!          

## 2016-03-29 NOTE — Progress Notes (Signed)
HPI Mr. Mees is referred by Truitt Merle for evaluation of PAF. The patient was followed previously by Dr. Mare Ferrari. He was initially found to be in atrial fib but asymptomatic. He was placed on anti-coagulation and flecainide. He feels well. He goes in and out of atrial fib but does not know it other than an rare palpitation. He walks an hour every day. He has worn a heart monitor demonstrating 10% atrial fib.  No Known Allergies   Current Outpatient Prescriptions  Medication Sig Dispense Refill  . amoxicillin (AMOXIL) 500 MG capsule Take 4 tablets by mouth 1 hour prior to dentist procedure 4 capsule 3  . Cholecalciferol (VITAMIN D) 2000 UNITS tablet Take 2,000 Units by mouth daily.      . Coenzyme Q10 (CO Q 10 PO) Take 200 mg by mouth daily.    Marland Kitchen ezetimibe-simvastatin (VYTORIN) 10-40 MG per tablet Take 1 tablet by mouth at bedtime.      . fexofenadine (ALLEGRA) 60 MG tablet Take 60 mg by mouth as needed for allergies or rhinitis.     . fish oil-omega-3 fatty acids 1000 MG capsule Take 1 g by mouth daily.     . flecainide (TAMBOCOR) 50 MG tablet Take 1 tablet (50 mg total) by mouth 2 (two) times daily. 180 tablet 2  . guaiFENesin (MUCINEX) 600 MG 12 hr tablet Take 600 mg by mouth 2 (two) times daily as needed (cough).     . meclizine (ANTIVERT) 25 MG tablet Take 25 mg by mouth daily as needed. FOR DIZZINESS  1  . metoprolol succinate (TOPROL-XL) 25 MG 24 hr tablet TAKE 1/2 TABLET BY MOUTH DAILY 45 tablet 0  . metroNIDAZOLE (METROCREAM) 0.75 % cream APP AND GENTLY MASSAGE INTO AFFECTED AREA BID  11  . sildenafil (VIAGRA) 50 MG tablet Take 50 mg by mouth daily as needed for erectile dysfunction (erectile dysfunction).    Alveda Reasons 20 MG TABS tablet TAKE 1 TABLET BY MOUTH DAILY 90 tablet 2   No current facility-administered medications for this visit.      Past Medical History:  Diagnosis Date  . Atrial fibrillation ([place])   . AVD (aortic valve disease)    with bicupsid aortic  valve  . Heart murmur   . Hyperlipidemia   . MVP (mitral valve prolapse)   . 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   Past Surgical History:  Procedure Laterality Date  . EYELID LACERATION REPAIR  2008   left and right  . [place]   right removal -- cancer  . MANDIBLE SURGERY  1987   right reconstruction  . TRANSTHORACIC ECHOCARDIOGRAM  07/18/2002   ef 71%     Family History  Problem Relation Age of Onset  . Stroke Mother   . Heart attack Father   . Hypertension Father   . Hypertension Sister   . Colon cancer Neg Hx      Social History   Social History  . Marital status: Married    Spouse name: [name]  . Number of children: 2  . Years of education: N/A   Occupational History  . retired    Social History Main Topics  . Smoking status: Never Smoker  . Smokeless tobacco: Never Used  . Alcohol use No  . Drug use: No  . Sexual activity: Not on file   Other  Topics Concern  . Not on file   Social History Narrative  . No narrative on file     BP 112/80   Pulse 84   Ht 5\' 7"  (1.702 m)   Wt 153 lb 12.8 oz (69.8 kg)   SpO2 97%   BMI 24.09 kg/m   Physical Exam:  Well appearing 70 yo man, NAD HEENT: Unremarkable Neck:  6 cm JVD, no thyromegally Lymphatics:  No adenopathy Back:  No CVA tenderness Lungs:  Clear with no wheezes HEART:  Regular rate rhythm, no murmurs, no rubs, no clicks Abd:  soft, positive bowel sounds, no organomegally, no rebound, no guarding Ext:  2 plus pulses, no edema, no cyanosis, no clubbing Skin:  No rashes no nodules Neuro:  CN II through XII intact, motor grossly intact  DEVICE  Normal device function.  See PaceArt for details.   Assess/Plan: 1. PAF - He is minimally if at all symptomatic. I would suggest he continue his anti-coagulation and for now at least continue low dose flecainide. We could consider a trial of stopping  flecainide. I am sure that it is helping keep him in rhythm but not sure it matters as he truly appears asymptomatic with a controlled rate.  2. Bicuspid valve/ AS - at her last echo, her stenosis was mild. A2 is still prominent. 3. Dyslipidemia - he will continue statin therapy.  Mikle Bosworth.D.

## 2016-04-06 ENCOUNTER — Other Ambulatory Visit: Payer: Self-pay | Admitting: *Deleted

## 2016-04-06 MED ORDER — RIVAROXABAN 20 MG PO TABS: 20.0000 mg | ORAL_TABLET | Freq: Every day | ORAL | 3 refills | Status: DC

## 2016-04-21 ENCOUNTER — Other Ambulatory Visit: Payer: Self-pay | Admitting: *Deleted

## 2016-04-21 MED ORDER — FLECAINIDE ACETATE 50 MG PO TABS: 50.0000 mg | ORAL_TABLET | Freq: Two times a day (BID) | ORAL | 3 refills | Status: DC

## 2016-05-03 ENCOUNTER — Other Ambulatory Visit: Payer: Self-pay | Admitting: Nurse Practitioner

## 2016-09-20 ENCOUNTER — Encounter: Payer: Self-pay | Admitting: Nurse Practitioner

## 2016-09-20 ENCOUNTER — Ambulatory Visit (INDEPENDENT_AMBULATORY_CARE_PROVIDER_SITE_OTHER): Payer: Medicare Other | Admitting: Nurse Practitioner

## 2016-09-20 ENCOUNTER — Telehealth: Payer: Self-pay | Admitting: *Deleted

## 2016-09-20 VITALS — BP 128/82 | HR 70 | Ht 67.0 in | Wt 157.4 lb

## 2016-09-20 DIAGNOSIS — I482 Chronic atrial fibrillation, unspecified: Secondary | ICD-10-CM

## 2016-09-20 DIAGNOSIS — Q231 Congenital insufficiency of aortic valve: Secondary | ICD-10-CM | POA: Diagnosis not present

## 2016-09-20 DIAGNOSIS — E7800 Pure hypercholesterolemia, unspecified: Secondary | ICD-10-CM

## 2016-09-20 DIAGNOSIS — Z79899 Other long term (current) drug therapy: Secondary | ICD-10-CM | POA: Diagnosis not present

## 2016-09-20 NOTE — Telephone Encounter (Signed)
lvm to have recent labs faxed to office from Dr. Joylene Draft @ [phone]

## 2016-09-20 NOTE — Patient Instructions (Addendum)
We will be checking the following labs today - NONE  We will call Dr. Joylene Draft and get your most recent labs for our records   Medication Instructions:    Continue with your current medicines.     Testing/Procedures To Be Arranged:  N/A  Follow-Up:   See Dr. Lovena Le in September  See me next March    Other Special Instructions:   N/A    If you need a refill on your cardiac medications before your next appointment, please call your pharmacy.   Call the [place] office at [phone] if you have any questions, problems or concerns.

## 2016-09-20 NOTE — Progress Notes (Signed)
CARDIOLOGY OFFICE NOTE  Date:  09/20/2016    Brian Klein Date of Birth: Nov 17, 1945 Medical Record #[id number]  PCP:  Jerlyn Ly, MD  Cardiologist:  Atilano Median  Chief Complaint  Patient presents with  . Atrial Fibrillation    Follow up visit - seen for Dr. Lovena Le    History of Present Illness: Brian Klein is a 71 y.o. male who presents today for a follow up visit. Former patient of Dr. Sherryl Barters. He is following with me and Dr. Lovena Le going forward.   He has had PAF and is on Xarelto and Flecainide. The patient has a history of bicuspid aortic valve. Last echocardiogram 08/2015 showed mild aortic stenosis and mild aortic insufficiency and normal LV function. Other issues include HLD and ED.  I saw him back in July - was doing well but was back in AF and probably had been for at least a month prior to that visit. Asymptomatic. We discussed his options. Event monitor was placed. Ended up referring to Dr. Lovena Le for his recommendations. Dr. Lovena Le suggested continuing his anti-coagulation and for now at least we will continue low dose flecainide. Dr. Lovena Le will be seeing him yearly.   Comes back today. Here with his wife today. He is doing very well. Has had some URI symptoms but now resolved. Now on ARB for elevated BP by his PCP. No chest pain. Breathing is good. Remains very active. No falls. Not dizzy. No awareness of being in AF. No palpitations.  Past Medical History:  Diagnosis Date  . Atrial fibrillation ([place])   . AVD (aortic valve disease)    with bicupsid aortic valve  . Heart murmur   . Hyperlipidemia   . MVP (mitral valve prolapse)   . 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 EYELID LACERATION REPAIR  2008   left and right  . [place]   right removal -- cancer  . MANDIBLE SURGERY  1987   right reconstruction  . TRANSTHORACIC ECHOCARDIOGRAM  07/18/2002   ef 71%      Medications: Current Outpatient Prescriptions  Medication Sig Dispense Refill  . amoxicillin (AMOXIL) 500 MG capsule Take 4 tablets by mouth 1 hour prior to dentist procedure 4 capsule 3  . Cholecalciferol (VITAMIN D) 2000 UNITS tablet Take 2,000 Units by mouth daily.      . Coenzyme Q10 (CO Q 10 PO) Take 200 mg by mouth daily.    Marland Kitchen ezetimibe-simvastatin (VYTORIN) 10-40 MG per tablet Take 1 tablet by mouth at bedtime.      . fexofenadine (ALLEGRA) 60 MG tablet Take 60 mg by mouth as needed for allergies or rhinitis.     . fish oil-omega-3 fatty acids 1000 MG capsule Take 1 g by mouth daily.     . flecainide (TAMBOCOR) 50 MG tablet Take 1 tablet (50 mg total) by mouth 2 (two) times daily. 180 tablet 3  . guaiFENesin (MUCINEX) 600 MG 12 hr tablet Take 600 mg by mouth 2 (two) times daily as needed (cough).     . meclizine (ANTIVERT) 25 MG tablet Take 25 mg by mouth daily as needed. FOR DIZZINESS  1  . metoprolol succinate (TOPROL-XL) 25 MG 24 hr tablet TAKE 1/2 TABLET BY MOUTH DAILY 45 tablet 6  . metroNIDAZOLE (METROCREAM) 0.75 % cream APP AND GENTLY MASSAGE INTO AFFECTED AREA BID  11  . rivaroxaban (XARELTO) 20  MG TABS tablet Take 1 tablet (20 mg total) by mouth daily. 90 tablet 3  . sildenafil (VIAGRA) 50 MG tablet Take 50 mg by mouth daily as needed for erectile dysfunction (erectile dysfunction).    . valsartan (DIOVAN) 80 MG tablet 1 tablet daily     No current facility-administered medications for this visit.     Allergies: No Known Allergies  Social History: The patient  reports that he has never smoked. He has never used smokeless tobacco. He reports that he does not drink alcohol or use drugs.   Family History: The patient's family history includes Heart attack in his father; Hypertension in his father and sister; Stroke in his mother.   Review of Systems: Please see the history of present illness.   Otherwise, the review of systems is positive for none.   All other  systems are reviewed and negative.   Physical Exam: VS:  BP 128/82   Pulse 70   Ht 5\' 7"  (1.702 m)   Wt 157 lb 6.4 oz (71.4 kg)   BMI 24.65 kg/m  .  BMI Body mass index is 24.65 kg/m.  Wt Readings from Last 3 Encounters:  09/20/16 157 lb 6.4 oz (71.4 kg)  03/29/16 153 lb 12.8 oz (69.8 kg)  03/22/16 153 lb (69.4 kg)    General: Pleasant. Well developed, well nourished and in no acute distress.   HEENT: Normal.  Neck: Supple, no JVD, carotid bruits, or masses noted.  Cardiac: Regular rate and rhythm. Soft outflow murmur. No edema.  Respiratory:  Lungs are clear to auscultation bilaterally with normal work of breathing.  GI: Soft and nontender.  MS: No deformity or atrophy. Gait and ROM intact.  Skin: Warm and dry. Color is normal.  Neuro:  Strength and sensation are intact and no gross focal deficits noted.  Psych: Alert, appropriate and with normal affect.   LABORATORY DATA:  EKG:  EKG is ordered today. This demonstrates atrial fibrillation with a controlled VR.  Lab Results  Component Value Date   WBC 5.1 12/17/2011   HGB 13.0 12/17/2011   HCT 38.9 (L) 12/17/2011   PLT 192.0 12/17/2011   GLUCOSE 97 10/04/2012   ALT 20 12/17/2011   AST 25 12/17/2011   NA 139 10/04/2012   K 4.4 10/04/2012   CL 105 10/04/2012   CREATININE 1.0 10/04/2012   BUN 14 10/04/2012   CO2 27 10/04/2012   TSH 2.28 12/17/2011    BNP (last 3 results) No results for input(s): BNP in the last 8760 hours.  ProBNP (last 3 results) No results for input(s): PROBNP in the last 8760 hours.   Other Studies Reviewed Today:  Echo Study Conclusions from 08/2015  - Left ventricle: The cavity size was normal. Wall thickness was  normal. Systolic function was normal. The estimated ejection  fraction was in the range of 55% to 60%. Wall motion was normal;  there were no regional wall motion abnormalities. Left  ventricular diastolic function parameters were normal. - Aortic valve: Valve  mobility was restricted. There was mild  stenosis. There was mild regurgitation. - Mitral valve: Calcified annulus. Mildly thickened leaflets . - Left atrium: The atrium was mildly dilated.  Impressions:  - Normal LV function; calcified aortic valve with probable fusion  of right and left cusps with functionally bicuspid aortic valve;  mild AS and mild AI.  Assessment/Plan: 1. PAF - managed with AAD therapy and anticoagulation - has seen Dr. Lovena Le - will continue with his current  regimen. He is in AF today - totally asymptomatic.   2. Bicuspid AV/AS - echo from February of 2017 noted - will arrange to get echo updated.   3. HLD - on statin - has labs with PCP. Will try to get a copy of his most recent labs.   4. Chronic anticoagulation with Xarelto - no problems noted. His labs are checked by PCP.  5. HTN - now on ARB - BP looks good.   Current medicines are reviewed with the patient today.  The patient does not have concerns regarding medicines other than what has been noted above.  The following changes have been made:  See above.  Labs/ tests ordered today include:    Orders Placed This Encounter  Procedures  . EKG 12-Lead  . ECHOCARDIOGRAM COMPLETE     Disposition:   FU with Dr. Lovena Le in September and me in one year.   Patient is agreeable to this plan and will call if any problems develop in the interim.   SignedTruitt Merle, NP  09/20/2016 9:22 AM  [facility] [address] [place], [place]  [address] Phone: [phone] Fax: [phone]

## 2016-10-05 ENCOUNTER — Ambulatory Visit (HOSPITAL_COMMUNITY): Payer: Medicare Other | Attending: Cardiology

## 2016-10-05 ENCOUNTER — Other Ambulatory Visit: Payer: Self-pay

## 2016-10-05 DIAGNOSIS — I482 Chronic atrial fibrillation, unspecified: Secondary | ICD-10-CM

## 2016-10-05 DIAGNOSIS — Q231 Congenital insufficiency of aortic valve: Secondary | ICD-10-CM | POA: Insufficient documentation

## 2016-10-05 DIAGNOSIS — I4891 Unspecified atrial fibrillation: Secondary | ICD-10-CM | POA: Insufficient documentation

## 2016-10-05 DIAGNOSIS — Q2381 Bicuspid aortic valve: Secondary | ICD-10-CM

## 2016-12-18 ENCOUNTER — Other Ambulatory Visit: Payer: Self-pay | Admitting: Nurse Practitioner

## 2016-12-29 ENCOUNTER — Encounter: Payer: Self-pay | Admitting: Nurse Practitioner

## 2016-12-29 ENCOUNTER — Telehealth: Payer: Self-pay | Admitting: Nurse Practitioner

## 2016-12-29 NOTE — Telephone Encounter (Signed)
Reviewed with Truitt Merle, NP who advised patient to make certain he is eating and drinking regularly, particularly when he is active. She advised him to limit driving over today and the next couple of days to make certain that he feels fine to drive. She advised he seek immediate medical attention if this occurs again. Per Lori's advice, I offered him an appointment with an APP for tomorrow or he can continue to monitor symptoms and HR and BP and send to [facility] next week. He states he would like to monitor and contact our office next week. I advised him to call back with questions or concerns. He verbalized understanding and agreement with plan and thanked me for the call.

## 2016-12-29 NOTE — Telephone Encounter (Signed)
Called patient in response to this message received through MyChart advice request:  [name], This morning, Wednesday, June 20, at 7:30 AM, I was driving to an appointment when a feeling that I was going to pass out came over me. I became a little disoriented. After pulling over and sitting for about 5 minutes, the feeling subsided and after another 5 minutes, I continued to my appointment. Since then, I have had no further symptoms. I have a Fitbit watch, so I checked my heart rate which was about 70 bpm. I felt my heart and it felt normal.  That morning, I had walked for an hour and felt fine. When I got into my truck, I felt fine. The symptoms came on abruptly and lasted about 3 minutes. Feeling ok now (11:00 AM). Brian Klein  Patient denies irregular or fast-feeling heart rate at the time of the event or recently. States he had not eaten breakfast but had taken morning medications. He states he felt a "heaviness", a feeling he had never felt before and thought he might pass out but did not. He states he put his head back in the seat and the feeling subsided in 3-5 minutes. He reports he walked for 1 hour this morning without difficulty - denies SOB or chest discomfort at this time or any time recently. States he has been monitoring BP at home and since starting Valsartan in February as directed by Dr. Joylene Draft, PCP, his average BP has been 120/80 mmHg. States most recent reading was 127/81 mmHg 2 days ago. He denies recent episodes of vertigo. I advised that I will review this with Truitt Merle, NP who is in the office today and call him back with her advice. He verbalized understanding and agreement and thanked me for the call.

## 2017-04-03 ENCOUNTER — Other Ambulatory Visit: Payer: Self-pay | Admitting: Nurse Practitioner

## 2017-04-04 NOTE — Telephone Encounter (Signed)
Age 71 Wt 71.4 kg  09/20/2016 Saw Dr Lovena Le 09/20/2016 Labs per Dr Joylene Draft on 08/03/2016 Hgb 13.1 HCT 41.2  SrCr 1.0 CrCl 66.44 Refill done for Xarelto 20 mg daily as requested

## 2017-04-08 ENCOUNTER — Other Ambulatory Visit: Payer: Self-pay | Admitting: Internal Medicine

## 2017-04-13 ENCOUNTER — Ambulatory Visit (INDEPENDENT_AMBULATORY_CARE_PROVIDER_SITE_OTHER): Payer: Medicare Other

## 2017-04-13 ENCOUNTER — Ambulatory Visit (INDEPENDENT_AMBULATORY_CARE_PROVIDER_SITE_OTHER): Payer: Medicare Other | Admitting: Orthopaedic Surgery

## 2017-04-13 DIAGNOSIS — M25551 Pain in right hip: Secondary | ICD-10-CM

## 2017-04-13 DIAGNOSIS — M722 Plantar fascial fibromatosis: Secondary | ICD-10-CM | POA: Insufficient documentation

## 2017-04-13 DIAGNOSIS — M79672 Pain in left foot: Secondary | ICD-10-CM

## 2017-04-13 DIAGNOSIS — M545 Low back pain, unspecified: Secondary | ICD-10-CM | POA: Insufficient documentation

## 2017-04-13 MED ORDER — LIDOCAINE HCL 1 % IJ SOLN
1.0000 mL | INTRAMUSCULAR | Status: AC | PRN
  Administered 2017-04-13: 1 mL

## 2017-04-13 MED ORDER — METHYLPREDNISOLONE ACETATE 40 MG/ML IJ SUSP
40.0000 mg | INTRAMUSCULAR | Status: AC | PRN
  Administered 2017-04-13: 40 mg

## 2017-04-13 MED ORDER — DICLOFENAC SODIUM 1 % TD GEL: 2.0000 g | Freq: Four times a day (QID) | TRANSDERMAL | 3 refills | Status: DC

## 2017-04-13 NOTE — Progress Notes (Signed)
Office Visit Note   Patient: Brian Klein           Date of Birth: [birth year]           MRN: [id number] Visit Date: 04/13/2017              Requested by: Crist Infante, MD [address] [place], [place] [address] PCP: Crist Infante, MD   Assessment & Plan: Visit Diagnoses:  1. Pain of left heel   2. Pain in right hip   3. Acute right-sided low back pain without sciatica   4. Plantar fasciitis of left foot     Plan: He cannot take anti-inflammatories since he is on blood thinning medication but I do feel comfortable with trying a topical anti-inflammatories such as Voltaren gel. I explained how to use a stem as well as all questions and concerns were answered and addressed. Also talked about a steroid injection in his plantar fascial left side and he tolerated this well. Talked about stretching exercises that will help him. He'll follow-up as needed.  Follow-Up Instructions: Return if symptoms worsen or fail to improve.   Orders:  Orders Placed This Encounter  Procedures  . Foot Injection  . XR HIP UNILAT W OR W/O PELVIS 1V RIGHT  . XR Os Calcis Left   Meds ordered this encounter  Medications  . diclofenac sodium (VOLTAREN) 1 % GEL    Sig: Apply 2 g topically 4 (four) times daily.    Dispense:  100 g    Refill:  3      Procedures: Foot Inj Date/Time: 04/13/2017 10:31 AM Performed by: [name] Authorized by: Jean Rosenthal Y   Condition: Plantar Fasciitis   Location: left plantar fascia muscle   Medications:  1 mL lidocaine 1 %; 40 mg methylPREDNISolone acetate 40 MG/ML     Clinical Data: No additional findings.   Subjective: No chief complaint on file. The patient is well-known to me. He comes in with chief complaint of 3 weeks of left heel pain and one week of right hip pain and he points to the low back and more of the iliac crest on the right side sources pain. He says that hurts with flexion-extension is back issues is that he'll  hurts in the bottom of his heel on the left side. The back issues on the right side. The heel hurts when he first gets up in the morning or if he is sitting for long period time. He denies a numbness and tingling or weakness in either leg.  HPI  Review of Systems He currently denies any headache, chest pain, short of breath, fever, chills, nausea, vomiting.  Objective: Vital Signs: There were no vitals taken for this visit.  Physical Exam He is alert and oriented 3 and in no acute distress Ortho Exam Examination of his right hip and back area are normal other than some pain in and the paraspinous and oblique muscles around the back. He has excellent strength in his bilateral lower extremities and no radicular symptoms at all. His hip exam is normal the right side. He does have significant pain over the plantar fascia of his left heel. His Achilles is intact his foot otherwise neurovascular intact with normal structures are normal arch. Specialty Comments:  No specialty comments available.  Imaging: Xr Hip Unilat W Or W/o Pelvis 1v Right  Result Date: 04/13/2017 An AP pelvis and lateral of his right hip show a normal-appearing hip with no acute findings.  Xr Os Calcis Left  Result Date: 04/13/2017 2 views of left calcaneus so no acute findings and no evidence of fracture.    PMFS History: Patient Active Problem List   Diagnosis Date Noted  . Pain of left heel 04/13/2017  . Plantar fasciitis of left foot 04/13/2017  . Acute right-sided low back pain without sciatica 04/13/2017  . Pain in right hip 04/13/2017  . Abnormal CT scan 11/26/2015  . Chronic anticoagulation 11/26/2015  . PAF (paroxysmal atrial fibrillation) ([place]) 11/26/2015  . Seasonal allergies 11/19/2014  . Erectile dysfunction 01/10/2013  . Atrial fibrillation ([place]) 12/17/2011  . Bicuspid aortic valve 12/16/2010  . Hypercholesterolemia 12/16/2010   Past Medical History:  Diagnosis Date  . Abnormal CT scan  11/26/2015  . Atrial fibrillation ([place])   . AVD (aortic valve disease)    with bicupsid aortic valve  . Chronic anticoagulation 11/26/2015  . Erectile dysfunction 01/10/2013  . Heart murmur   . Hypercholesterolemia 12/16/2010  . Hyperlipidemia   . MVP (mitral valve prolapse)   . 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 PAF (paroxysmal atrial fibrillation) ([place]) 11/26/2015  . Seasonal allergies 11/19/2014    Family History  Problem Relation Age of Onset  . Stroke Mother   . Heart attack Father   . Hypertension Father   . Hypertension Sister   . Colon cancer Neg Hx     Past Surgical History:  Procedure Laterality Date  . EYELID LACERATION REPAIR  2008   left and right  . [place]   right removal -- cancer  . MANDIBLE SURGERY  1987   right reconstruction  . TRANSTHORACIC ECHOCARDIOGRAM  07/18/2002   ef 71%   Social History   Occupational History  . retired    Social History Main Topics  . Smoking status: Never Smoker  . Smokeless tobacco: Never Used  . Alcohol use No  . Drug use: No  . Sexual activity: Not on file

## 2017-04-19 ENCOUNTER — Ambulatory Visit (INDEPENDENT_AMBULATORY_CARE_PROVIDER_SITE_OTHER): Payer: Medicare Other | Admitting: Internal Medicine

## 2017-04-19 ENCOUNTER — Encounter: Payer: Self-pay | Admitting: Internal Medicine

## 2017-04-19 VITALS — BP 130/88 | HR 66 | Ht 67.0 in | Wt 154.1 lb

## 2017-04-19 DIAGNOSIS — I480 Paroxysmal atrial fibrillation: Secondary | ICD-10-CM | POA: Diagnosis not present

## 2017-04-19 NOTE — Patient Instructions (Addendum)
Medication Instructions:  Your physician has recommended you make the following change in your medication:  1.  Stop taking flecainide.  Labwork: None ordered.  Testing/Procedures: None ordered.  Follow-Up: Your physician wants you to follow-up in: one year with Dr. Lovena Le.   You will receive a reminder letter in the mail two months in advance. If you don't receive a letter, please call our office to schedule the follow-up appointment.   Any Other Special Instructions Will Be Listed Below (If Applicable).     If you need a refill on your cardiac medications before your next appointment, please call your pharmacy.

## 2017-04-19 NOTE — Progress Notes (Signed)
HPI [name] returns today for ongoing evaluation and management of atrial fibrillation in the setting of aortic valve stenosis. He is a very pleasant 71 year old man with a history of bicuspid aortic valve with mild stenosis, remote history of sarcoma of the jaw, who had developed paroxysmal atrial fibrillation which was controlled very nicely with flecainide therapy. In the last several months, he has not experienced palpitations. He walks daily up to 2 miles or longer sometimes as long as one hour and has no chest pain, shortness of breath, palpitations, or peripheral edema. No syncope. No Known Allergies   Current Outpatient Prescriptions  Medication Sig Dispense Refill  . amoxicillin (AMOXIL) 500 MG capsule TAKE 4 CAPSULES BY MOUTH 1 HOUR PRIOR TO DENTIST PROCEDURE 4 capsule 0  . Cholecalciferol (VITAMIN D) 2000 UNITS tablet Take 2,000 Units by mouth daily.      . Coenzyme Q10 (CO Q 10 PO) Take 200 mg by mouth daily.    . diclofenac sodium (VOLTAREN) 1 % GEL Apply 2 g topically 4 (four) times daily. 100 g 3  . ezetimibe-simvastatin (VYTORIN) 10-40 MG per tablet Take 1 tablet by mouth at bedtime.      . fexofenadine (ALLEGRA) 60 MG tablet Take 60 mg by mouth as needed for allergies or rhinitis.     . fish oil-omega-3 fatty acids 1000 MG capsule Take 1 g by mouth daily.     . flecainide (TAMBOCOR) 50 MG tablet TAKE 1 TABLET(50 MG) BY MOUTH TWICE DAILY 180 tablet 0  . guaiFENesin (MUCINEX) 600 MG 12 hr tablet Take 600 mg by mouth 2 (two) times daily as needed (cough).     . metoprolol succinate (TOPROL-XL) 25 MG 24 hr tablet TAKE 1/2 TABLET BY MOUTH DAILY 45 tablet 6  . metroNIDAZOLE (METROCREAM) 0.75 % cream APP AND GENTLY MASSAGE INTO AFFECTED AREA BID  11  . SHINGRIX injection ADM 0.5ML IM UTD  1  . sildenafil (VIAGRA) 50 MG tablet Take 50 mg by mouth daily as needed for erectile dysfunction (erectile dysfunction).    Brian Klein Reasons 20 MG TABS tablet TAKE 1 TABLET(20 MG) BY MOUTH  DAILY 90 tablet 1   No current facility-administered medications for this visit.      Past Medical History:  Diagnosis Date  . Abnormal CT scan 11/26/2015  . Atrial fibrillation (Brian Klein)   . AVD (aortic valve disease)    with bicupsid aortic valve  . Chronic anticoagulation 11/26/2015  . Erectile dysfunction 01/10/2013  . Heart murmur   . Hypercholesterolemia 12/16/2010  . Hyperlipidemia   . MVP (mitral valve prolapse)   . Osteogenic sarcoma (Brian Klein) 1976   jaw bone  . PAF (paroxysmal atrial fibrillation) (Brian Klein) 11/26/2015  . Seasonal allergies 11/19/2014    ROS:   All systems reviewed and negative except as noted in the HPI.   Past Surgical History:  Procedure Laterality Date  . EYELID LACERATION REPAIR  2008   left and right  . [place]   right removal -- cancer  . MANDIBLE SURGERY  1987   right reconstruction  . TRANSTHORACIC ECHOCARDIOGRAM  07/18/2002   ef 71%     Family History  Problem Relation Age of Onset  . Stroke Mother   . Heart attack Father   . Hypertension Father   . Hypertension Sister   . Colon cancer Neg Hx      Social History   Social History  . Marital status: Married  Spouse name: Brian Klein  . Number of children: 2  . Years of education: N/A   Occupational History  . retired    Social History Main Topics  . Smoking status: Never Smoker  . Smokeless tobacco: Never Used  . Alcohol use No  . Drug use: No  . Sexual activity: Not on file   Other Topics Concern  . Not on file   Social History Narrative  . No narrative on file     BP 130/88   Pulse 66   Ht 5\' 7"  (1.702 m)   Wt 154 lb 1.6 oz (69.9 kg)   SpO2 99%   BMI 24.14 kg/m   Physical Exam:  Well appearing 72 year old man, NAD HEENT: Unremarkable Neck:  No JVD, no thyromegally Lymphatics:  No adenopathy Back:  No CVA tenderness Lungs:  Clear, with no wheezes, rales, or rhonchi HEART:  IRegular rate rhythm, no murmurs, no rubs, no clicks Abd:  soft, positive  bowel sounds, no organomegally, no rebound, no guarding Ext:  2 plus pulses, no edema, no cyanosis, no clubbing Skin:  No rashes no nodules Neuro:  CN II through XII intact, motor grossly intact  EKG - atrial fibrillation with a controlled ventricular response  Assess/Plan: 1. Atrial fibrillation - the patient appears to be persistently in atrial fibrillation at this point, but had no sensation that his heart was out of rhythm. He denies palpitations. He could not tell when he went and atrial fibrillation. Because he is asymptomatic, I recommended that he stop his flecainide, and continue low-dose beta blocker therapy in conjunction with anticoagulation. I'll see him back in a year. 2. Aortic valve stenosis - on exam, his aortic stenosis is not severe. He is asymptomatic. He will undergo watchful waiting. 3. Dyslipidemia - he will continue Vytorin for lipid lowering.  Cristopher Klein, M.D.

## 2017-06-21 ENCOUNTER — Other Ambulatory Visit: Payer: Self-pay | Admitting: Nurse Practitioner

## 2017-07-30 ENCOUNTER — Other Ambulatory Visit: Payer: Self-pay | Admitting: Nurse Practitioner

## 2017-09-07 ENCOUNTER — Encounter: Payer: Self-pay | Admitting: Internal Medicine

## 2017-09-12 ENCOUNTER — Ambulatory Visit (INDEPENDENT_AMBULATORY_CARE_PROVIDER_SITE_OTHER): Payer: Medicare Other | Admitting: Orthopaedic Surgery

## 2017-09-12 ENCOUNTER — Encounter (INDEPENDENT_AMBULATORY_CARE_PROVIDER_SITE_OTHER): Payer: Self-pay | Admitting: Orthopaedic Surgery

## 2017-09-12 DIAGNOSIS — M722 Plantar fascial fibromatosis: Secondary | ICD-10-CM | POA: Diagnosis not present

## 2017-09-12 MED ORDER — LIDOCAINE HCL 1 % IJ SOLN
1.0000 mL | INTRAMUSCULAR | Status: AC | PRN
  Administered 2017-09-12: 1 mL

## 2017-09-12 MED ORDER — METHYLPREDNISOLONE ACETATE 40 MG/ML IJ SUSP
40.0000 mg | INTRAMUSCULAR | Status: AC | PRN
  Administered 2017-09-12: 40 mg

## 2017-09-12 NOTE — Progress Notes (Signed)
Office Visit Note   Patient: Brian Klein           Date of Birth: 1946/06/08           MRN: [id number] Visit Date: 09/12/2017              Requested by: Crist Infante, MD [address] [place], [place] [address] PCP: Crist Infante, MD   Assessment & Plan: Visit Diagnoses:  1. Plantar fasciitis, right     Plan: Given the fact that his plantar heel injection for plantar fasciitis in the left side worked so well I feel that he would benefit from this in the right heel and he agrees.  He is to work on stretching exercises.  I told him about treatment for some mucous cyst all questions concerns were answered and addressed.  He tolerated the steroid injection in his right hand well.  He will follow-up as needed.  Follow-Up Instructions: Return if symptoms worsen or fail to improve.   Orders:  No orders of the defined types were placed in this encounter.  No orders of the defined types were placed in this encounter.     Procedures: Foot Inj Date/Time: 09/12/2017 3:06 PM Performed by: [name], MD Authorized by: [name], MD   Condition: Plantar Fasciitis   Location: right plantar fascia muscle   Medications:  1 mL lidocaine 1 %; 40 mg methylPREDNISolone acetate 40 MG/ML     Clinical Data: No additional findings.   Subjective: Chief Complaint  Patient presents with  . Right Foot - Pain  The patient comes in today with right foot and heel pain.  He denies any specific injury.  We treated for plantar fasciitis last year he said it helped greatly.  He also has a illness thumb he wants me to look at.  He says he had a wound on his thumb and then went away and he was told it was arthritis and just wants another opinion about that.  His thumb hurt and started on occasion but mainly his right heel that bothers him the most.  He says stretching exercises and injection helped the left one go away.  This is the first step in the morning is very painful to  him on his right heel.  He denies any injury.  He currently denies any headache, chest pain, shortness of breath, fever, chills, nausea, vomiting.  HPI  Review of Systems See above for review of systems he is alert and oriented x3 and in no acute distress  Objective: Vital Signs: There were no vitals taken for this visit.  Physical Exam He is alert and oriented x3 and in no acute distress Ortho Exam Examination of his right heel shows pain only over the plantar fascia with a normal foot exam otherwise.  Examination of his left thumb shows prominence of bone and likely bone spur at the thumb IP joint but there is no active wound.  The way he describes from the force consistent with a mucous cyst Specialty Comments:  No specialty comments available.  Imaging: No results found.   PMFS History: Patient Active Problem List   Diagnosis Date Noted  . Plantar fasciitis, right 09/12/2017  . Pain of left heel 04/13/2017  . Plantar fasciitis of left foot 04/13/2017  . Acute right-sided low back pain without sciatica 04/13/2017  . Pain in right hip 04/13/2017  . Abnormal CT scan 11/26/2015  . Chronic anticoagulation 11/26/2015  . PAF (paroxysmal atrial fibrillation) (  [facility]) 11/26/2015  . Seasonal allergies 11/19/2014  . Erectile dysfunction 01/10/2013  . Atrial fibrillation ([facility]) 12/17/2011  . Bicuspid aortic valve 12/16/2010  . Hypercholesterolemia 12/16/2010   Past Medical History:  Diagnosis Date  . Abnormal CT scan 11/26/2015  . Atrial fibrillation ([place])   . AVD (aortic valve disease)    with bicupsid aortic valve  . Chronic anticoagulation 11/26/2015  . Erectile dysfunction 01/10/2013  . Heart murmur   . Hypercholesterolemia 12/16/2010  . Hyperlipidemia   . MVP (mitral valve prolapse)   . 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 PAF (paroxysmal atrial fibrillation) ([facility]) 11/26/2015  . Seasonal allergies 11/19/2014    Family History  Problem Relation Age of Onset  . Stroke  Mother   . Heart attack Father   . Hypertension Father   . Hypertension Sister   . Colon cancer Neg Hx     Past Surgical History:  Procedure Laterality Date  . EYELID LACERATION REPAIR  2008   left and right  . [place]   right removal -- cancer  . MANDIBLE SURGERY  1987   right reconstruction  . TRANSTHORACIC ECHOCARDIOGRAM  07/18/2002   ef 71%   Social History   Occupational History  . Occupation: retired  Tobacco Use  . Smoking status: Never Smoker  . Smokeless tobacco: Never Used  Substance and Sexual Activity  . Alcohol use: No  . Drug use: No  . Sexual activity: Not on file

## 2017-09-19 ENCOUNTER — Encounter: Payer: Self-pay | Admitting: Nurse Practitioner

## 2017-09-19 ENCOUNTER — Ambulatory Visit: Payer: Medicare Other | Admitting: Nurse Practitioner

## 2017-09-19 VITALS — BP 124/70 | HR 70 | Ht 67.0 in | Wt 157.8 lb

## 2017-09-19 DIAGNOSIS — Q231 Congenital insufficiency of aortic valve: Secondary | ICD-10-CM

## 2017-09-19 DIAGNOSIS — I482 Chronic atrial fibrillation, unspecified: Secondary | ICD-10-CM

## 2017-09-19 DIAGNOSIS — E7800 Pure hypercholesterolemia, unspecified: Secondary | ICD-10-CM | POA: Diagnosis not present

## 2017-09-19 NOTE — Progress Notes (Addendum)
CARDIOLOGY OFFICE NOTE  Date:  09/19/2017    Brian Klein Date of Birth: 1945/08/10 Medical Record #[id number]  PCP:  Crist Infante, MD  Cardiologist:  Atilano Median    Chief Complaint  Patient presents with  . Atrial Fibrillation    6 month check - seen for Dr. Lovena Le    History of Present Illness: Brian Klein is a 72 y.o. male who presents today for a 5 month check. Former patient of Dr. Sherryl Barters. He is following with me and Dr. Lovena Le going forward.   He has had PAF and is on Xarelto and Flecainide. The patient has a history of bicuspid aortic valve. Last echocardiogram 08/2015 showed mild aortic stenosis and mild aortic insufficiency and normal LV function. Other issues include HLD and ED.  I saw him back in July of 2017- was doing well but was back in AF and probably had been for at least a month prior to that visit. Asymptomatic. We discussed his options. Event monitor was placed. Ended up referring to Dr. Lovena Le for his recommendations. Dr. Lovena Le initially suggested continuing his Flecainide but at last check in October - this was stopped.   Comes back today. Here with his wife today. He continues to do very well. No chest pain. Breathing is good. Not palpitations. No syncope. Exercising regularly. Tolerating his medicines. Feels like he is doing well overall with no concern. Had labs last week with PCP - for physical later this week. Labs look great. BP is doing well. They have sold their house and are downsizing.   Past Medical History:  Diagnosis Date  . Abnormal CT scan 11/26/2015  . Atrial fibrillation ([facility])   . AVD (aortic valve disease)    with bicupsid aortic valve  . Chronic anticoagulation 11/26/2015  . Erectile dysfunction 01/10/2013  . Heart murmur   . Hypercholesterolemia 12/16/2010  . Hyperlipidemia   . MVP (mitral valve prolapse)   . 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 PAF (paroxysmal atrial fibrillation) ([facility]) 11/26/2015  .  Seasonal allergies 11/19/2014    Past Surgical History:  Procedure Laterality Date  . EYELID LACERATION REPAIR  2008   left and right  . [place]   right removal -- cancer  . MANDIBLE SURGERY  1987   right reconstruction  . TRANSTHORACIC ECHOCARDIOGRAM  07/18/2002   ef 71%     Medications: Current Meds  Medication Sig  . amoxicillin (AMOXIL) 500 MG capsule TAKE 4 CAPSULES BY MOUTH 1 HOUR PRIOR TO DENTIST PROCEDURE  . Cholecalciferol (VITAMIN D) 2000 UNITS tablet Take 2,000 Units by mouth daily.    . Coenzyme Q10 (CO Q 10 PO) Take 200 mg by mouth daily.  . diclofenac sodium (VOLTAREN) 1 % GEL Apply 2 g topically 4 (four) times daily.  Mar[place] Kitchen ezetimibe-simvastatin (VYTORIN) 10-40 MG per tablet Take 1 tablet by mouth at bedtime.    . fexofenadine (ALLEGRA) 60 MG tablet Take 60 mg by mouth as needed for allergies or rhinitis.   . fish oil-omega-3 fatty acids 1000 MG capsule Take 1 g by mouth daily.   . metoprolol succinate (TOPROL-XL) 25 MG 24 hr tablet TAKE 1/2 TABLET BY MOUTH DAILY  . metroNIDAZOLE (METROCREAM) 0.75 % cream APP AND GENTLY MASSAGE INTO AFFECTED AREA BID  . sildenafil (VIAGRA) 50 MG tablet Take 50 mg by mouth daily as needed for erectile dysfunction (erectile dysfunction).  Alveda Reasons 20 MG TABS tablet TAKE 1 TABLET(20  MG) BY MOUTH DAILY     Allergies: No Known Allergies  Social History: The patient  reports that  has never smoked. he has never used smokeless tobacco. He reports that he does not drink alcohol or use drugs.   Family History: The patient's family history includes Heart attack in his father; Hypertension in his father and sister; Stroke in his mother.   Review of Systems: Please see the history of present illness.   Otherwise, the review of systems is positive for none.   All other systems are reviewed and negative.   Physical Exam: VS:  BP 124/70 (BP Location: Left Arm, Patient Position: Sitting, Cuff Size: Normal)   Pulse 70   Ht 5\' 7"   (1.702 m)   Wt 157 lb 12.8 oz (71.6 kg)   SpO2 97% Comment: at rest  BMI 24.71 kg/m  .  BMI Body mass index is 24.71 kg/m.  Wt Readings from Last 3 Encounters:  09/19/17 157 lb 12.8 oz (71.6 kg)  04/19/17 154 lb 1.6 oz (69.9 kg)  09/20/16 157 lb 6.4 oz (71.4 kg)    General: Pleasant. Well developed, well nourished and in no acute distress.   HEENT: Normal. Jaw deformity noted.  Neck: Supple, no JVD, carotid bruits, or masses noted.  Cardiac: Irregular irregular rhythm. Rate is ok. Very soft outflow murmur noted.  No edema.  Respiratory:  Lungs are clear to auscultation bilaterally with normal work of breathing.  GI: Soft and nontender.  MS: No deformity or atrophy. Gait and ROM intact.  Skin: Warm and dry. Color is normal.  Neuro:  Strength and sensation are intact and no gross focal deficits noted.  Psych: Alert, appropriate and with normal affect.   LABORATORY DATA:  EKG:  EKG is not ordered today.   Lab Results  Component Value Date   WBC 5.1 12/17/2011   HGB 13.0 12/17/2011   HCT 38.9 (L) 12/17/2011   PLT 192.0 12/17/2011   GLUCOSE 97 10/04/2012   ALT 20 12/17/2011   AST 25 12/17/2011   NA 139 10/04/2012   K 4.4 10/04/2012   CL 105 10/04/2012   CREATININE 1.0 10/04/2012   BUN 14 10/04/2012   CO2 27 10/04/2012   TSH 2.28 12/17/2011       BNP (last 3 results) No results for input(s): BNP in the last 8760 hours.  ProBNP (last 3 results) No results for input(s): PROBNP in the last 8760 hours.   Other Studies Reviewed Today:  Echo Study Conclusions March 2018  - Left ventricle: The cavity size was normal. Wall thickness was   increased in a pattern of mild LVH. Indeterminant diastolic   function (atrial fibrillation). The estimated ejection fraction   was 55%. Wall motion was normal; there were no regional wall   motion abnormalities. - Aortic valve: Probably trileaflet; moderately calcified leaflets,   left and right coronary cusps appear fused and  non-mobile. There   was mild stenosis. There was mild regurgitation. Mean gradient   (S): 10 mm Hg. - Mitral valve: Mildly calcified annulus. Flattened mitral valve   closure plane. There was trivial regurgitation. - Left atrium: The atrium was moderately dilated. - Right ventricle: The cavity size was normal. Systolic function   was normal. - Right atrium: The atrium was mildly dilated. - Tricuspid valve: Peak RV-RA gradient (S): 21 mm Hg. - Pulmonary arteries: PA peak pressure: 24 mm Hg (S). - Inferior vena cava: The vessel was normal in size. The   respirophasic  diameter changes were in the normal range (= 50%),   consistent with normal central venous pressure.  Impressions:  - The patient was in atrial fibrillation. Normal LV size with mild   LV hypertrophy. EF 55%. Mild aortic stenosis, mild aortic   regurgitation. The left and right coronary cusps of the aortic   valve appeared immobile. Normal RV size and systolic function.   Assessment/Plan: 1. PAF - managed with rate control and anticoagulation - he continues to do well clinically. No changes made today.   2. Bicuspid AV/AS - mild AS - will repeat echo in 2020.   3. HLD - on statin - his most recent labs look great.    4. Chronic anticoagulation with Xarelto - no problems noted. HGB last week ok.   5. HTN - BP ok on current regimen.   Current medicines are reviewed with the patient today.  The patient does not have concerns regarding medicines other than what has been noted above.  The following changes have been made:  See above.  Labs/ tests ordered today include:   No orders of the defined types were placed in this encounter.    Disposition:   FU with Dr. Lovena Le in 6 months and me in 1 year.   Patient is agreeable to this plan and will call if any problems develop in the interim.   SignedTruitt Merle, NP  09/19/2017 9:31 AM  [facility] [address] [place], [place]  [address] Phone: [phone] Fax: [phone]

## 2017-09-19 NOTE — Patient Instructions (Addendum)
We will be checking the following labs today - NONE   Medication Instructions:    Continue with your current medicines.     Testing/Procedures To Be Arranged:  N/A  Follow-Up:   See Dr. Lovena Le in October  See me in one year.    Other Special Instructions:   Keep up the good work!    If you need a refill on your cardiac medications before your next appointment, please call your pharmacy.   Call the [place] office at [phone] if you have any questions, problems or concerns.

## 2017-09-28 ENCOUNTER — Encounter: Payer: Self-pay | Admitting: Internal Medicine

## 2017-09-28 ENCOUNTER — Encounter: Payer: Self-pay | Admitting: Nurse Practitioner

## 2017-09-28 ENCOUNTER — Ambulatory Visit: Payer: Medicare Other | Admitting: Nurse Practitioner

## 2017-09-28 ENCOUNTER — Telehealth: Payer: Self-pay

## 2017-09-28 VITALS — BP 126/84 | HR 108 | Ht 66.0 in | Wt 157.0 lb

## 2017-09-28 DIAGNOSIS — Z7901 Long term (current) use of anticoagulants: Secondary | ICD-10-CM | POA: Diagnosis not present

## 2017-09-28 DIAGNOSIS — Z86010 Personal history of colonic polyps: Secondary | ICD-10-CM

## 2017-09-28 MED ORDER — SUPREP BOWEL PREP KIT 17.5-3.13-1.6 GM/177ML PO SOLN: 1.0000 | ORAL | 0 refills | Status: DC

## 2017-09-28 NOTE — Patient Instructions (Signed)
If you are age 74 or older, your body mass index should be between 23-30. Your Body mass index is 25.34 kg/m. If this is out of the aforementioned range listed, please consider follow up with your Primary Care Provider.  If you are age 96 or younger, your body mass index should be between 19-25. Your Body mass index is 25.34 kg/m. If this is out of the aformentioned range listed, please consider follow up with your Primary Care Provider.   You have been scheduled for a colonoscopy. Please follow written instructions given to you at your visit today.  Please pick up your prep supplies at the pharmacy within the next 1-3 days. If you use inhalers (even only as needed), please bring them with you on the day of your procedure. Your physician has requested that you go to www.startemmi.com and enter the access code given to you at your visit today. This web site gives a general overview about your procedure. However, you should still follow specific instructions given to you by our office regarding your preparation for the procedure.  You will be contacted by our office prior to your procedure for directions on holding your Xarelto.  If you do not hear from our office 1 week prior to your scheduled procedure, please call [phone] to discuss.   Thank you for choosing [facility]  [name]

## 2017-09-28 NOTE — Progress Notes (Signed)
      IMPRESSION and PLAN:    #52. 72 year old male with history of adenomatous colon polyps, due for surveillance colonoscopy.  No GI complaints such as bowel changes or blood in stool.  -Patient will be scheduled for a surveillance colonoscopy with possible polypectomy.  The risks and benefits of the procedure were discussed and the patient agrees to proceed.   #2.  AFIB. Rate 108, irregular today. On Xarelto.  -Hold Xarelto for 2 days before procedure - will instruct when and how to resume after procedure. Patient understands that there is a low but real risk of cardiovascular event such as heart attack, stroke, embolism, thrombosis or ischemia/infarct while off Xarelto. The patient consents to proceed. Will communicate by phone or EMR with patient's prescribing provider to confirm that holding Xarelto is reasonable in this case.     HPI:    Chief Complaint: due for surveillance colonoscopy  Patient is a 72 yo male with chronic AFIB on Xarelto, bicuspid aortic valve, mild aortic insufficiency. LV EF 55 % on Echo in March 2018. He is known to Dr. Henrene Pastor for a hx of adenomatous of colon polyps.  Colonoscopy polypectomy January 2014 with removal of 2 small cecal tubular adenomas without high-grade dysplasia,  he is due for surveillance colonoscopy.  Patient was last seen June 2017 for evaluation of a CT scan suggesting a gastric mass.  Subsequent EGD was unremarkable however . Patient has no GI complaints. Specifically, he has no bowel changes, abdominal pain, blood in stool or unexpected weight loss.   Review of systems:    No chest pain. No palpitations. No SOB.     Past Medical History:  Diagnosis Date  . Abnormal CT scan 11/26/2015  . Atrial fibrillation ([place])   . AVD (aortic valve disease)    with bicupsid aortic valve  . Chronic anticoagulation 11/26/2015  . Erectile dysfunction 01/10/2013  . Heart murmur   . Hypercholesterolemia 12/16/2010  . Hyperlipidemia   . MVP (mitral valve  prolapse)   . 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 PAF (paroxysmal atrial fibrillation) ([facility]) 11/26/2015  . Seasonal allergies 11/19/2014    Patient's surgical history, family medical history, social history, medications and allergies were all reviewed in Epic    Physical Exam:     BP 126/84   Pulse (!) 108   Ht 5\' 6"  (1.676 m)   Wt 157 lb (71.2 kg)   BMI 25.34 kg/m   GENERAL:  Well developed male in NAD PSYCH: :Pleasant, cooperative, normal affect EENT:  conjunctiva pink, mucous membranes moist, neck supple without masses CARDIAC:  Mild tachycardia, irregular rhythm, soft murmur heard,  PULM: Normal respiratory effort, lungs CTA bilaterally, no wheezing ABDOMEN:  Nondistended, soft, nontender. No obvious masses, no hepatomegaly,  normal bowel sounds SKIN:  turgor, no lesions seen Musculoskeletal:  Normal muscle tone, normal strength NEURO: Alert and oriented x 3, no focal neurologic deficits   [name] , NP 09/28/2017, 9:02 AM

## 2017-09-28 NOTE — Telephone Encounter (Signed)
[facility] [address] [place], [place]  [address] Phone:  [phone]   Fax:  [phone]  09/28/2017   RE:      Brian Klein DOB:   1945/08/18 MRN:   [id number]   Dear Dr. Lovena Le,    We have scheduled the above patient for an endoscopic procedure. Our records show that he is on anticoagulation therapy.   Please advise as to how long the patient may come off his therapy of Xarelto prior to the colonoscopy procedure, which is scheduled for 10/10/17.  Please fax back/ or route the completed form to [facility] at [phone].   Sincerely,    Karena Addison, CMA

## 2017-09-29 NOTE — Telephone Encounter (Signed)
   Will send this to the Pharmacy pool.  Rosaria Ferries, PA-C 09/29/2017 11:50 AM Beeper [phone]

## 2017-09-29 NOTE — Telephone Encounter (Signed)
Patient with diagnosis of atrial fibrillation  on Xarelto for anticoagulation.    Procedure: endoscopy Date of procedure: 10/10/17  CHADS2-VASc score of  2 (HTN, AGE)  CrCl 59.3 Platelet count 207 (Dec 2016)  Per office protocol, patient can hold Xarelto for 1 days prior to procedure.    Patient should restart Xarelto on the evening of procedure or day after, at discretion of procedure MD

## 2017-10-03 ENCOUNTER — Telehealth: Payer: Self-pay

## 2017-10-03 ENCOUNTER — Telehealth: Payer: Self-pay | Admitting: Internal Medicine

## 2017-10-03 NOTE — Telephone Encounter (Signed)
Rockne Menghini, RPH-CPP       1:17 PM  Note    Patient with diagnosis of atrial fibrillation  on Xarelto for anticoagulation.    Procedure: endoscopy Date of procedure: 10/10/17  CHADS2-VASc score of  2 (HTN, AGE)  CrCl 59.3 Platelet count 207 (Dec 2016)  Per office protocol, patient can hold Xarelto for 1 days prior to procedure.    Patient should restart Xarelto on the evening of procedure or day after, at discretion of procedure MD            Reviewed with pt that he needs to hold Xarelto for 1 day prior to procedure. Pt verbalized understanding.

## 2017-10-03 NOTE — Telephone Encounter (Signed)
Spoke with patient today regarding holding his Xarelto one day prior to his procedure per Dr. Tanna Furry instructions. Patient verbalized understanding.

## 2017-10-04 NOTE — Progress Notes (Signed)
assessment and plan reviewed 

## 2017-10-10 ENCOUNTER — Encounter: Payer: Self-pay | Admitting: Internal Medicine

## 2017-10-10 ENCOUNTER — Other Ambulatory Visit: Payer: Self-pay | Admitting: Internal Medicine

## 2017-10-10 ENCOUNTER — Ambulatory Visit (AMBULATORY_SURGERY_CENTER): Payer: Medicare Other | Admitting: Internal Medicine

## 2017-10-10 ENCOUNTER — Other Ambulatory Visit: Payer: Self-pay

## 2017-10-10 VITALS — BP 102/73 | HR 75 | Temp 98.0°F | Resp 13 | Ht 66.0 in | Wt 157.0 lb

## 2017-10-10 DIAGNOSIS — Z86010 Personal history of colonic polyps: Secondary | ICD-10-CM | POA: Diagnosis not present

## 2017-10-10 DIAGNOSIS — D120 Benign neoplasm of cecum: Secondary | ICD-10-CM | POA: Diagnosis not present

## 2017-10-10 MED ORDER — SODIUM CHLORIDE 0.9 % IV SOLN: 500.0000 mL | Freq: Once | INTRAVENOUS | Status: DC

## 2017-10-10 NOTE — Patient Instructions (Signed)
Handout given on polyps  YOU HAD AN ENDOSCOPIC PROCEDURE TODAY: Refer to the procedure report and other information in the discharge instructions given to you for any specific questions about what was found during the examination. If this information does not answer your questions, please call [place] office at [phone] to clarify.   YOU SHOULD EXPECT: Some feelings of bloating in the abdomen. Passage of more gas than usual. Walking can help get rid of the air that was put into your GI tract during the procedure and reduce the bloating. If you had a lower endoscopy (such as a colonoscopy or flexible sigmoidoscopy) you may notice spotting of blood in your stool or on the toilet paper. Some abdominal soreness may be present for a day or two, also.  DIET: Your first meal following the procedure should be a light meal and then it is ok to progress to your normal diet. A half-sandwich or bowl of soup is an example of a good first meal. Heavy or fried foods are harder to digest and may make you feel nauseous or bloated. Drink plenty of fluids but you should avoid alcoholic beverages for 24 hours. If you had a esophageal dilation, please see attached instructions for diet.    ACTIVITY: Your care partner should take you home directly after the procedure. You should plan to take it easy, moving slowly for the rest of the day. You can resume normal activity the day after the procedure however YOU SHOULD NOT DRIVE, use power tools, machinery or perform tasks that involve climbing or major physical exertion for 24 hours (because of the sedation medicines used during the test).   SYMPTOMS TO REPORT IMMEDIATELY: A gastroenterologist can be reached at any hour. Please call [phone]  for any of the following symptoms:  Following lower endoscopy (colonoscopy, flexible sigmoidoscopy) Excessive amounts of blood in the stool  Significant tenderness, worsening of abdominal pains  Swelling of the abdomen that is  new, acute  Fever of 100 or higher    FOLLOW UP:  If any biopsies were taken you will be contacted by phone or by letter within the next 1-3 weeks. Call [phone]  if you have not heard about the biopsies in 3 weeks.  Please also call with any specific questions about appointments or follow up tests.  

## 2017-10-10 NOTE — Progress Notes (Signed)
Called to room to assist during endoscopic procedure.  Patient ID and intended procedure confirmed with present staff. Received instructions for my participation in the procedure from the performing physician.  

## 2017-10-10 NOTE — Op Note (Signed)
[facility] Patient Name: Brian Klein Procedure Date: 10/10/2017 10:54 AM MRN: [id number] Endoscopist: Docia Chuck. Henrene Pastor , MD Age: 72 Referring MD:  Date of Birth: Nov 25, 1945 Gender: Male Account #: [id number] Procedure:                Colonoscopy, With cold snare polypectomy x 3 Indications:              High risk colon cancer surveillance: Personal                            history of multiple (3 or more) adenomas. Previous                            examinations 2003, 2008, 2013 Medicines:                Monitored Anesthesia Care Procedure:                Pre-Anesthesia Assessment:                           - Prior to the procedure, a History and Physical                            was performed, and patient medications and                            allergies were reviewed. The patient's tolerance of                            previous anesthesia was also reviewed. The risks                            and benefits of the procedure and the sedation                            options and risks were discussed with the patient.                            All questions were answered, and informed consent                            was obtained. Prior Anticoagulants: The patient has                            taken Xarelto (rivaroxaban), last dose was 2 days                            prior to procedure. ASA Grade Assessment: III - A                            patient with severe systemic disease. After                            reviewing the risks and benefits, the patient was  deemed in satisfactory condition to undergo the                            procedure.                           After obtaining informed consent, the colonoscope                            was passed under direct vision. Throughout the                            procedure, the patient's blood pressure, pulse, and                            oxygen saturations were monitored  continuously. The                            Colonoscope was introduced through the anus and                            advanced to the the cecum, identified by                            appendiceal orifice and ileocecal valve. The                            ileocecal valve, appendiceal orifice, and rectum                            were photographed. The quality of the bowel                            preparation was excellent. The colonoscopy was                            performed without difficulty. The patient tolerated                            the procedure well. The bowel preparation used was                            SUPREP. Scope In: 11:05:15 AM Scope Out: 11:22:28 AM Scope Withdrawal Time: 0 hours 13 minutes 57 seconds  Total Procedure Duration: 0 hours 17 minutes 13 seconds  Findings:                 Three polyps were found in the cecum. The polyps                            were 1 to 2 mm in size. These polyps were removed                            with a cold snare. Resection and retrieval were  complete.                           Internal hemorrhoids were found during retroflexion.                           The exam was otherwise without abnormality on                            direct and retroflexion views. Complications:            No immediate complications. Estimated blood loss:                            None. Estimated Blood Loss:     Estimated blood loss: none. Impression:               - Three 1 to 2 mm polyps in the cecum, removed with                            a cold snare. Resected and retrieved.                           - Internal hemorrhoids.                           - The examination was otherwise normal on direct                            and retroflexion views. Recommendation:           - Repeat colonoscopy in 5 years for surveillance,                            To be considered if medically fit and willing.                            - Resume Xarelto (rivaroxaban) today at prior dose.                           - Patient has a contact number available for                            emergencies. The signs and symptoms of potential                            delayed complications were discussed with the                            patient. Return to normal activities tomorrow.                            Written discharge instructions were provided to the                            patient.                           -  Resume previous diet.                           - Continue present medications.                           - Await pathology results. Docia Chuck. Henrene Pastor, MD 10/10/2017 11:26:21 AM This report has been signed electronically.

## 2017-10-10 NOTE — Progress Notes (Signed)
To recovery, report to RN, VSS. 

## 2017-10-10 NOTE — Progress Notes (Signed)
Pt's states no medical or surgical changes since previsit or office visit. 

## 2017-10-10 NOTE — Telephone Encounter (Signed)
Xarelto 20mg  refill request received; pt is 72 yrs old, wt- 71.2kg, Crea-1.19 on 09/14/17 by PCP, last seen by [name] on 09/19/17, CrCl-54.53ml/min. Will send in refill to requested pharmacy.

## 2017-10-11 ENCOUNTER — Telehealth: Payer: Self-pay

## 2017-10-11 NOTE — Telephone Encounter (Signed)
Called [phone] and left a messaged we tried to reach pt for a follow up call. maw

## 2017-10-11 NOTE — Telephone Encounter (Signed)
Called [phone] and left a messaged we tried to reach pt for a follow up call. maw

## 2017-10-14 ENCOUNTER — Encounter: Payer: Self-pay | Admitting: Internal Medicine

## 2017-11-07 ENCOUNTER — Ambulatory Visit (INDEPENDENT_AMBULATORY_CARE_PROVIDER_SITE_OTHER): Payer: Medicare Other | Admitting: Orthopaedic Surgery

## 2017-11-07 DIAGNOSIS — M722 Plantar fascial fibromatosis: Secondary | ICD-10-CM

## 2017-11-07 MED ORDER — LIDOCAINE HCL 1 % IJ SOLN
1.0000 mL | INTRAMUSCULAR | Status: AC | PRN
  Administered 2017-11-07: 1 mL

## 2017-11-07 MED ORDER — METHYLPREDNISOLONE ACETATE 40 MG/ML IJ SUSP
40.0000 mg | INTRAMUSCULAR | Status: AC | PRN
  Administered 2017-11-07: 40 mg

## 2017-11-07 NOTE — Progress Notes (Signed)
Office Visit Note   Patient: Brian Klein           Date of Birth: 07/14/1945           MRN: [id number] Visit Date: 11/07/2017              Requested by: Crist Infante, MD [address] [place], [place] [address] PCP: Crist Infante, MD   Assessment & Plan: Visit Diagnoses:  1. Plantar fasciitis, right     Plan: He will continue his heel stretching exercises.  Also want to try one more steroid injection he is agreeable to this and he tolerated well.  All questions concerns were answered and addressed.  At this point follow-up as needed since he is slowly improving.  Follow-Up Instructions: Return if symptoms worsen or fail to improve.   Orders:  Orders Placed This Encounter  Procedures  . Foot Inj   No orders of the defined types were placed in this encounter.     Procedures: Foot Inj Date/Time: 11/07/2017 3:25 PM Performed by: [name], MD Authorized by: [name], MD   Condition: Plantar Fasciitis   Location: right plantar fascia muscle   Medications:  1 mL lidocaine 1 %; 40 mg methylPREDNISolone acetate 40 MG/ML     Clinical Data: No additional findings.   Subjective: Chief Complaint  Patient presents with  . Right Foot - Pain  Patient is well-known to me.  He has a history of right foot plantar fasciitis.  We did inject his foot in early March.  I feel comfortable trying it one more time because he is not a diabetic and not a smoker.  He is an active 72 year old he says it is feeling like he needs an injection but is not as bad.  He has tried heel pads and gel inserts.  He feels like that may have made things worse for him.  HPI  Review of Systems He has no recent illnesses.  Objective: Vital Signs: There were no vitals taken for this visit.  Physical Exam Is alert and oriented Ortho Exam Examination of his right foot does show pain to direct palpation over the plantar fascial only at the heel.  His Achilles is intact  the remainder of his foot exam is normal. Specialty Comments:  No specialty comments available.  Imaging: No results found.   PMFS History: Patient Active Problem List   Diagnosis Date Noted  . Plantar fasciitis, right 09/12/2017  . Pain of left heel 04/13/2017  . Plantar fasciitis of left foot 04/13/2017  . Acute right-sided low back pain without sciatica 04/13/2017  . Pain in right hip 04/13/2017  . Abnormal CT scan 11/26/2015  . Chronic anticoagulation 11/26/2015  . PAF (paroxysmal atrial fibrillation) ([facility]) 11/26/2015  . Seasonal allergies 11/19/2014  . Erectile dysfunction 01/10/2013  . Atrial fibrillation ([facility]) 12/17/2011  . Bicuspid aortic valve 12/16/2010  . Hypercholesterolemia 12/16/2010   Past Medical History:  Diagnosis Date  . Abnormal CT scan 11/26/2015  . Atrial fibrillation ([place])   . AVD (aortic valve disease)    with bicupsid aortic valve  . Chronic anticoagulation 11/26/2015  . Erectile dysfunction 01/10/2013  . Heart murmur   . Hypercholesterolemia 12/16/2010  . Hyperlipidemia   . Hypertension   . MVP (mitral valve prolapse)   . Osteogenic sarcoma ([facility]) 1976   jaw bone  . PAF (paroxysmal atrial fibrillation) ([place]) 11/26/2015  . Seasonal allergies 11/19/2014    Family History  Problem Relation Age of  Onset  . Stroke Mother   . Heart attack Father   . Hypertension Father   . Hypertension Sister   . Colon cancer Neg Hx   . Stomach cancer Neg Hx   . Rectal cancer Neg Hx   . Esophageal cancer Neg Hx     Past Surgical History:  Procedure Laterality Date  . EYELID LACERATION REPAIR  2008   left and right  . [place]   right removal -- cancer  . MANDIBLE SURGERY  1987   right reconstruction  . TRANSTHORACIC ECHOCARDIOGRAM  07/18/2002   ef 71%   Social History   Occupational History  . Occupation: retired  Tobacco Use  . Smoking status: Never Smoker  . Smokeless tobacco: Never Used  Substance and Sexual Activity  . Alcohol use: No   . Drug use: No  . Sexual activity: Not on file

## 2018-04-16 ENCOUNTER — Other Ambulatory Visit: Payer: Self-pay | Admitting: Nurse Practitioner

## 2018-05-10 ENCOUNTER — Other Ambulatory Visit: Payer: Self-pay | Admitting: Nurse Practitioner

## 2018-05-10 NOTE — Telephone Encounter (Signed)
Pt's pharmacy is requesting a refill on amoxicillin for dental procedure. Please address

## 2018-05-30 ENCOUNTER — Encounter (INDEPENDENT_AMBULATORY_CARE_PROVIDER_SITE_OTHER): Payer: Self-pay | Admitting: Orthopaedic Surgery

## 2018-05-30 ENCOUNTER — Ambulatory Visit (INDEPENDENT_AMBULATORY_CARE_PROVIDER_SITE_OTHER): Payer: Medicare Other | Admitting: Orthopaedic Surgery

## 2018-05-30 ENCOUNTER — Ambulatory Visit (INDEPENDENT_AMBULATORY_CARE_PROVIDER_SITE_OTHER): Payer: Self-pay

## 2018-05-30 DIAGNOSIS — M25562 Pain in left knee: Secondary | ICD-10-CM | POA: Diagnosis not present

## 2018-05-30 MED ORDER — METHYLPREDNISOLONE ACETATE 40 MG/ML IJ SUSP
40.0000 mg | INTRAMUSCULAR | Status: AC | PRN
  Administered 2018-05-30: 40 mg via INTRA_ARTICULAR

## 2018-05-30 MED ORDER — LIDOCAINE HCL 1 % IJ SOLN
3.0000 mL | INTRAMUSCULAR | Status: AC | PRN
  Administered 2018-05-30: 3 mL

## 2018-05-30 NOTE — Progress Notes (Signed)
Office Visit Note   Patient: Brian Klein           Date of Birth: 02-12-1946           MRN: [id number] Visit Date: 05/30/2018              Requested by: Crist Infante, MD [address] [place], [place] [address] PCP: Crist Infante, MD   Assessment & Plan: Visit Diagnoses:  1. Left knee pain, unspecified chronicity     Plan: I did feel it was appropriate to try a steroid injection in his left knee and explained the rationale behind this as well as the risk benefits involved with injections.  He agreed with this treatment plan and tolerated the steroid injection well.  He will increase his activities as comfort allows.  We can reevaluate him in 4 weeks to see how he is doing.  This is a knee we would obtain an MRI of if he continues to remain symptomatic.  All questions concerns were answered and addressed.  Follow-Up Instructions: Return in about 4 weeks (around 06/27/2018).   Orders:  Orders Placed This Encounter  Procedures  . Large Joint Inj  . XR Knee 1-2 Views Left   No orders of the defined types were placed in this encounter.     Procedures: Large Joint Inj: L knee on 05/30/2018 10:07 AM Indications: diagnostic evaluation and pain Details: 22 G 1.5 in needle, superolateral  approach  Arthrogram:  No  Medications: 3 mL lidocaine 1 %; 40 mg methylPREDNISolone acetate 40 MG/ML Outcome: tolerated well, no immediate complications Procedure, treatment alternatives, risks and benefits explained, specific risks discussed. Consent was given by the patient. Immediately prior to procedure a time out was called to verify the correct patient, procedure, equipment, support staff and site/side marked as required. Patient was prepped and draped in the usual sterile fashion.       Clinical Data: No additional findings.   Subjective: Chief Complaint  Patient presents with  . Left Knee - Pain  The patient is being seen today for evaluation treatment of left knee pain.  Is been having pain in the back of his knee for about 2 months now with no known injury.  He thinks he may have a Baker's cyst.  He is a regular patient of Dr. Joylene Draft from [facility].  I have seen him before as well.  This is a new issue for him.  He says when he gets down on his knees he has a hard time getting up and mainly is just pain in the back of his left knee with no known injury.  He does not report any swelling either.  He is not a diabetic.  He is on Xarelto for chronic atrial fibrillation.  HPI  Review of Systems He currently denies any headache, chest pain, shortness of breath, fever, chills, nausea, vomiting.  Objective: Vital Signs: There were no vitals taken for this visit.  Physical Exam Is alert and oriented x3 and in no acute distress Ortho Exam Examination of his left knee shows no effusion at all.  There is slight varus malalignment but his range of motion is full.  He does hurt in the posterior medial corner of the knee itself but a negative McMurray's and negative Lockman's exam.  The patella tracks well but has slight crepitation. Specialty Comments:  No specialty comments available.  Imaging: Xr Knee 1-2 Views Left  Result Date: 05/30/2018 2 views of the left  knee show no acute findings.  The medial lateral compartments are very well-maintained.  There is mild patellofemoral arthritic changes.  The overall alignment shows slight varus malalignment.  This is only slight.  There is no joint effusion.    PMFS History: Patient Active Problem List   Diagnosis Date Noted  . Plantar fasciitis, right 09/12/2017  . Pain of left heel 04/13/2017  . Plantar fasciitis of left foot 04/13/2017  . Acute right-sided low back pain without sciatica 04/13/2017  . Pain in right hip 04/13/2017  . Abnormal CT scan 11/26/2015  . Chronic anticoagulation 11/26/2015  . PAF (paroxysmal atrial fibrillation) ([facility]) 11/26/2015  . Seasonal allergies 11/19/2014  . Erectile dysfunction 01/10/2013  . Atrial fibrillation ([facility]) 12/17/2011  . Bicuspid aortic valve 12/16/2010  . Hypercholesterolemia 12/16/2010   Past Medical History:  Diagnosis Date  . Abnormal CT scan 11/26/2015  . Atrial fibrillation ([facility])   . AVD (aortic valve disease)    with bicupsid aortic valve  . Chronic anticoagulation 11/26/2015  . Erectile dysfunction 01/10/2013  . Heart murmur   . Hypercholesterolemia 12/16/2010  . Hyperlipidemia   . Hypertension   . MVP (mitral valve prolapse)   . Osteogenic sarcoma ([facility]) 1976   jaw bone  . PAF (paroxysmal atrial fibrillation) ([facility]) 11/26/2015  . Seasonal allergies 11/19/2014    Family History  Problem Relation Age of Onset  . Stroke Mother   . Heart attack Father   . Hypertension Father   .  Hypertension Sister   . Colon cancer Neg Hx   . Stomach cancer Neg Hx   . Rectal cancer Neg Hx   . Esophageal cancer Neg Hx     Past Surgical History:  Procedure Laterality Date  . EYELID LACERATION REPAIR  2008   left and right  . [place]   right removal -- cancer  . MANDIBLE SURGERY  1987   right reconstruction  . TRANSTHORACIC ECHOCARDIOGRAM  07/18/2002   ef 71%   Social History   Occupational History  . Occupation: retired  Tobacco Use   . Smoking status: Never Smoker  . Smokeless tobacco: Never Used  Substance and Sexual Activity  . Alcohol use: No  . Drug use: No  . Sexual activity: Not on file

## 2018-06-27 ENCOUNTER — Ambulatory Visit (INDEPENDENT_AMBULATORY_CARE_PROVIDER_SITE_OTHER): Payer: Medicare Other | Admitting: Orthopaedic Surgery

## 2018-09-01 ENCOUNTER — Encounter: Payer: Self-pay | Admitting: Nurse Practitioner

## 2018-09-12 DIAGNOSIS — H43813 Vitreous degeneration, bilateral: Secondary | ICD-10-CM | POA: Insufficient documentation

## 2018-09-12 DIAGNOSIS — H2513 Age-related nuclear cataract, bilateral: Secondary | ICD-10-CM | POA: Insufficient documentation

## 2018-09-12 DIAGNOSIS — H02831 Dermatochalasis of right upper eyelid: Secondary | ICD-10-CM | POA: Insufficient documentation

## 2018-09-12 DIAGNOSIS — H31002 Unspecified chorioretinal scars, left eye: Secondary | ICD-10-CM | POA: Insufficient documentation

## 2018-09-12 DIAGNOSIS — H0100A Unspecified blepharitis right eye, upper and lower eyelids: Secondary | ICD-10-CM | POA: Insufficient documentation

## 2018-09-12 DIAGNOSIS — H25011 Cortical age-related cataract, right eye: Secondary | ICD-10-CM | POA: Insufficient documentation

## 2018-09-20 ENCOUNTER — Other Ambulatory Visit: Payer: Self-pay

## 2018-09-20 ENCOUNTER — Encounter: Payer: Self-pay | Admitting: Nurse Practitioner

## 2018-09-20 ENCOUNTER — Ambulatory Visit: Payer: Medicare Other | Admitting: Nurse Practitioner

## 2018-09-20 VITALS — BP 104/70 | HR 65 | Ht 66.0 in | Wt 162.8 lb

## 2018-09-20 DIAGNOSIS — I482 Chronic atrial fibrillation, unspecified: Secondary | ICD-10-CM | POA: Diagnosis not present

## 2018-09-20 DIAGNOSIS — Q231 Congenital insufficiency of aortic valve: Secondary | ICD-10-CM | POA: Diagnosis not present

## 2018-09-20 NOTE — Patient Instructions (Addendum)
We will be checking the following labs today - NONE  Please ask Dr. Joylene Draft to send me a copy of your labs    Medication Instructions:    Continue with your current medicines.    If you need a refill on your cardiac medications before your next appointment, please call your pharmacy.     Testing/Procedures To Be Arranged:  Echocardiogram - to follow up the aortic valve  Follow-Up:   See me in October    At [facility], you and your health needs are our priority.  As part of our continuing mission to provide you with exceptional heart care, we have created designated Provider Care Teams.  These Care Teams include your primary Cardiologist (physician) and Advanced Practice Providers (APPs -  Physician Assistants and Nurse Practitioners) who all work together to provide you with the care you need, when you need it.  Special Instructions:  . None  Call the [place] office at [phone] if you have any questions, problems or concerns.

## 2018-09-20 NOTE — Progress Notes (Signed)
CARDIOLOGY OFFICE NOTE  Date:  09/20/2018    Brian Klein Date of Birth: 04/08/1946 Medical Record #[id number]  PCP:  Crist Infante, MD  Cardiologist:  Atilano Median    Chief Complaint  Patient presents with  . Follow-up    History of Present Illness: Brian Klein is a 73 y.o. male who presents today for a follow up visit. Former patient of Dr. Sherryl Barters. He is following with meand Dr. Quincy Simmonds forward.   He has had PAF and is on Xarelto and Flecainide. The patient has a history of bicuspid aortic valve. Last echocardiogram 08/2015 showed mild aortic stenosis and mild aortic insufficiency and normal LV function. Other issues include HLD and ED.  I saw him back in July of 2017- was doing well but was back in AF and probably had been for at least a month prior to that visit. Asymptomatic. We discussed his options. Event monitor was placed.Ended up referring to Dr. Lovena Le for his recommendations. Dr. Lovena Le initially suggested continuing his Flecainide but at prior visit in October of 2018 - this was stopped. Last seen by me a year ago - was doing well.   Comes back today. Herewith his wife today. He is doing well. Has had more issues with his feet and knee. No chest pain. Breathing is good. BP is great - he does not check at home for fear it will make him depressed. Getting physical with labs next month with Dr. Joylene Draft. He is getting at least 10,000 steps in a day. No syncope. Has had some vertigo. Does note feeling cold.   Past Medical History:  Diagnosis Date  . Abnormal CT scan 11/26/2015  . Atrial fibrillation ([place])   . AVD (aortic valve disease)    with bicupsid aortic valve  . Chronic anticoagulation 11/26/2015  . Erectile dysfunction 01/10/2013  . Heart murmur   . Hypercholesterolemia 12/16/2010  . Hyperlipidemia   . Hypertension   . MVP (mitral valve prolapse)   . 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 PAF (paroxysmal atrial fibrillation)  ([place]) 11/26/2015  . Seasonal allergies 11/19/2014    Past Surgical History:  Procedure Laterality Date  . EYELID LACERATION REPAIR  2008   left and right  . [place]   right removal -- cancer  . MANDIBLE SURGERY  1987   right reconstruction  . TRANSTHORACIC ECHOCARDIOGRAM  07/18/2002   ef 71%     Medications: Current Meds  Medication Sig  . amLODipine (NORVASC) 5 MG tablet 5 mg daily.   Marland Kitchen amoxicillin (AMOXIL) 500 MG capsule TAKE FOUR CAPSULES BY MOUTH 1 HOUR BEFORE DENTAL APPOINTMENT  . Cholecalciferol (VITAMIN D) 2000 UNITS tablet Take 2,000 Units by mouth daily.    . Coenzyme Q10 (CO Q 10 PO) Take 200 mg by mouth daily.  Marland Kitchen ezetimibe-simvastatin (VYTORIN) 10-40 MG per tablet Take 1 tablet by mouth at bedtime.    . fexofenadine (ALLEGRA) 60 MG tablet Take 60 mg by mouth as needed for allergies or rhinitis.   . fish oil-omega-3 fatty acids 1000 MG capsule Take 1 g by mouth daily.   . metoprolol succinate (TOPROL-XL) 25 MG 24 hr tablet TAKE 1/2 TABLET BY MOUTH DAILY  . olmesartan (BENICAR) 20 MG tablet Take 20 mg by mouth daily.  . sildenafil (VIAGRA) 50 MG tablet Take 50 mg by mouth daily as needed for erectile dysfunction (erectile dysfunction).  Alveda Reasons 20 MG TABS tablet TAKE 1 TABLET  BY MOUTH DAILY   Current Facility-Administered Medications for the 09/20/18 encounter (Office Visit) with Burtis Junes, NP  Medication  . 0.9 %  sodium chloride infusion     Allergies: No Known Allergies  Social History: The patient  reports that he has never smoked. He has never used smokeless tobacco. He reports that he does not drink alcohol or use drugs.   Family History: The patient's family history includes Heart attack in his father; Hypertension in his father and sister; Stroke in his mother.   Review of Systems: Please see the history of present illness.   Otherwise, the review of systems is positive for none.   All other systems are reviewed and negative.    Physical Exam: VS:  BP 104/70 (BP Location: Left Arm, Patient Position: Sitting, Cuff Size: Normal)   Pulse 65   Ht 5\' 6"  (1.676 m)   Wt 162 lb 12.8 oz (73.8 kg)   BMI 26.28 kg/m  .  BMI Body mass index is 26.28 kg/m.  Wt Readings from Last 3 Encounters:  09/20/18 162 lb 12.8 oz (73.8 kg)  10/10/17 157 lb (71.2 kg)  09/28/17 157 lb (71.2 kg)    General: Pleasant. Well developed, well nourished and in no acute distress.   HEENT: Normal. Facial deformity on the right - this is chronic - from prior mandible surgery.  Neck: Supple, no JVD, carotid bruits, or masses noted.  Cardiac: Regular rate and rhythm. Soft outflow murmur noted. No edema. 2+ bilateral pedal pulses. Respiratory:  Lungs are clear to auscultation bilaterally with normal work of breathing.  GI: Soft and nontender.  MS: No deformity or atrophy. Gait and ROM intact.  Skin: Warm and dry. Color is normal.  Neuro:  Strength and sensation are intact and no gross focal deficits noted.  Psych: Alert, appropriate and with normal affect.   LABORATORY DATA:  EKG:  EKG is ordered today. This demonstrates AF with controlled VR.  Lab Results  Component Value Date   WBC 5.1 12/17/2011   HGB 13.0 12/17/2011   HCT 38.9 (L) 12/17/2011   PLT 192.0 12/17/2011   GLUCOSE 97 10/04/2012   ALT 20 12/17/2011   AST 25 12/17/2011   NA 139 10/04/2012   K 4.4 10/04/2012   CL 105 10/04/2012   CREATININE 1.0 10/04/2012   BUN 14 10/04/2012   CO2 27 10/04/2012   TSH 2.28 12/17/2011       BNP (last 3 results) No results for input(s): BNP in the last 8760 hours.  ProBNP (last 3 results) No results for input(s): PROBNP in the last 8760 hours.   Other Studies Reviewed Today:  Echo Study Conclusions March 2018  - Left ventricle: The cavity size was normal. Wall thickness was increased in a pattern of mild LVH. Indeterminant diastolic function (atrial fibrillation). The estimated ejection fraction was 55%. Wall  motion was normal; there were no regional wall motion abnormalities. - Aortic valve: Probably trileaflet; moderately calcified leaflets, left and right coronary cusps appear fused and non-mobile. There was mild stenosis. There was mild regurgitation. Mean gradient (S): 10 mm Hg. - Mitral valve: Mildly calcified annulus. Flattened mitral valve closure plane. There was trivial regurgitation. - Left atrium: The atrium was moderately dilated. - Right ventricle: The cavity size was normal. Systolic function was normal. - Right atrium: The atrium was mildly dilated. - Tricuspid valve: Peak RV-RA gradient (S): 21 mm Hg. - Pulmonary arteries: PA peak pressure: 24 mm Hg (S). - Inferior vena  cava: The vessel was normal in size. The respirophasic diameter changes were in the normal range (= 50%), consistent with normal central venous pressure.  Impressions:  - The patient was in atrial fibrillation. Normal LV size with mild LV hypertrophy. EF 55%. Mild aortic stenosis, mild aortic regurgitation. The left and right coronary cusps of the aortic valve appeared immobile. Normal RV size and systolic function.   Assessment/Plan: 1. Persistent AF - managed with rate control and anticoagulation. No indication for PPM. He is doing well clinically. No changes made today.   2. Bicuspid AV/AS - mild AS - no cardinal symptoms - will get his echo updated.   3. HLD - on statin - having labs next month with PCP  4. Chronic anticoagulation with Xarelto - no problems noted. He will have labs next month - I have asked for a copy to review.    5. HTN - BP is great - no changes made here today.   6. Foot pain - excellent pedal pulses - he is going to discuss with orthopedics.   Current medicines are reviewed with the patient today.  The patient does not have concerns regarding medicines other than what has been noted above.  The following changes have been made:  See  above.  Labs/ tests ordered today include:    Orders Placed This Encounter  Procedures  . EKG 12-Lead  . ECHOCARDIOGRAM COMPLETE     Disposition:   FU with me in October.    Patient is agreeable to this plan and will call if any problems develop in the interim.   SignedTruitt Merle, NP  09/20/2018 10:07 AM  [facility] [address] [place], [place]  [address] Phone: [phone] Fax: [phone]

## 2018-09-26 ENCOUNTER — Other Ambulatory Visit: Payer: Self-pay

## 2018-09-26 ENCOUNTER — Ambulatory Visit (HOSPITAL_COMMUNITY): Payer: Medicare Other | Attending: Cardiovascular Disease

## 2018-09-26 DIAGNOSIS — Q231 Congenital insufficiency of aortic valve: Secondary | ICD-10-CM | POA: Insufficient documentation

## 2018-10-05 ENCOUNTER — Other Ambulatory Visit: Payer: Self-pay | Admitting: Internal Medicine

## 2018-10-05 NOTE — Telephone Encounter (Signed)
Pt last saw Truitt Merle, NP 09/20/18, last labs in [facility] from [facility] Creat 1.10 on 09/14/17, overdue for labwork.  Given COVID-19 anticipate routine yearly f/u at primary may be delayed.  Age 73, weight 73.8kg, CrCl 60.57, based on CrCl pt is on appropriate dosage of Xarelto 20mg  QD.  Will refill rx. Note placed on next appt with Lori on 04/24/19 to draw CBC and CMP at [facility] to reassess CrCl with updated labs.

## 2018-10-16 ENCOUNTER — Other Ambulatory Visit: Payer: Self-pay | Admitting: Nurse Practitioner

## 2019-01-19 ENCOUNTER — Other Ambulatory Visit (HOSPITAL_COMMUNITY): Payer: Self-pay | Admitting: Internal Medicine

## 2019-01-19 ENCOUNTER — Telehealth (HOSPITAL_COMMUNITY): Payer: Self-pay | Admitting: Rehabilitation

## 2019-01-19 DIAGNOSIS — G453 Amaurosis fugax: Secondary | ICD-10-CM

## 2019-01-19 DIAGNOSIS — I480 Paroxysmal atrial fibrillation: Secondary | ICD-10-CM

## 2019-01-19 NOTE — Telephone Encounter (Signed)

## 2019-01-22 ENCOUNTER — Ambulatory Visit (HOSPITAL_COMMUNITY)
Admission: RE | Admit: 2019-01-22 | Discharge: 2019-01-22 | Disposition: A | Discharge status: Home / Self Care | Payer: Medicare Other | Source: Ambulatory Visit | Attending: Family | Admitting: Family

## 2019-01-22 ENCOUNTER — Other Ambulatory Visit: Payer: Self-pay

## 2019-01-22 DIAGNOSIS — G453 Amaurosis fugax: Secondary | ICD-10-CM | POA: Diagnosis present

## 2019-01-23 ENCOUNTER — Ambulatory Visit (HOSPITAL_COMMUNITY): Admission: RE | Admit: 2019-01-23 | Payer: Medicare Other | Source: Ambulatory Visit

## 2019-01-24 ENCOUNTER — Other Ambulatory Visit: Payer: Self-pay

## 2019-01-24 ENCOUNTER — Ambulatory Visit (HOSPITAL_COMMUNITY)
Admission: RE | Admit: 2019-01-24 | Discharge: 2019-01-24 | Disposition: A | Discharge status: Home / Self Care | Payer: Medicare Other | Source: Ambulatory Visit | Attending: Internal Medicine | Admitting: Internal Medicine

## 2019-01-24 DIAGNOSIS — G453 Amaurosis fugax: Secondary | ICD-10-CM | POA: Diagnosis present

## 2019-01-24 DIAGNOSIS — I480 Paroxysmal atrial fibrillation: Secondary | ICD-10-CM | POA: Insufficient documentation

## 2019-01-24 MED ORDER — GADOBUTROL 1 MMOL/ML IV SOLN
7.0000 mL | Freq: Once | INTRAVENOUS | Status: AC | PRN
  Administered 2019-01-24: 7 mL via INTRAVENOUS

## 2019-04-18 ENCOUNTER — Other Ambulatory Visit: Payer: Self-pay | Admitting: Internal Medicine

## 2019-04-18 ENCOUNTER — Other Ambulatory Visit: Payer: Self-pay | Admitting: Nurse Practitioner

## 2019-04-18 NOTE — Telephone Encounter (Signed)
Xarelto 20mg  refill request received; pt is 73 years old, weight-73.8kg, Crea-1.10 on 01/22/2019 via [facility] from [facility], last seen by Truitt Merle on 09/20/2018, Diagnosis-Afib, [place].63ml/min; Dose is appropriate based on dosing criteria. Will send in refill to requested pharmacy.

## 2019-04-24 ENCOUNTER — Ambulatory Visit: Payer: Medicare Other | Admitting: Nurse Practitioner

## 2019-05-02 NOTE — Progress Notes (Signed)
CARDIOLOGY OFFICE NOTE  Date:  05/08/2019    [name] Date of Birth: Jan 16, 1946 Medical Record [id number]  PCP:  Crist Infante, MD  Cardiologist:  Atilano Median    Chief Complaint  Patient presents with  . Follow-up    Seen for Dr. Lovena Le    History of Present Illness: Brian Klein is a 73 y.o. male who presents today for a 7 month check. Former patient of Dr. Sherryl Barters. He is following with meand Dr. Quincy Simmonds forward.   He has had PAF and is on Xarelto and previously on Flecainide. The patient has a history of a functional bicuspid aortic valve. Other issues include HLD and ED.  I saw him back in [facility] 2017- was doing well but was back in AF and probably had been for at least a month prior to that visit. Asymptomatic. We discussed his options. Event monitor was placed.Ended up referring to Dr. Lovena Le for his recommendations. Dr. Taylorinitiallysuggested continuing hisFlecainide but at his visit in October of 2018 - this was stopped.Last seen by me back this past March and he was doing well. Does not check his BP at home - fears it will make him depressed.    The patient does not have symptoms concerning for COVID-19 infection (fever, chills, cough, or new shortness of breath).   Comes in today. Here alone. Doing well. Had a basal cell removed from the right temple a week ago by dermatology - this looks good. He had some vision issues back in the summer - had a carotid doppler - this was ok - has 1 to 39% bilateral disease noted. Other labs noted. Feels good. Walking daily. No chest pain. Not short of breath. No passing out. Tolerating his medicines. No bleeding/excessive bruising. He feels like he is doing well.   Past Medical History:  Diagnosis Date  . Abnormal CT scan 11/26/2015  . Atrial fibrillation ([place])   . AVD (aortic valve disease)    with bicupsid aortic valve  . Chronic anticoagulation 11/26/2015  . Erectile dysfunction 01/10/2013  .  Heart murmur   . Hypercholesterolemia 12/16/2010  . Hyperlipidemia   . Hypertension   . MVP (mitral valve prolapse)   . 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 PAF (paroxysmal atrial fibrillation) ([place]) 11/26/2015  . Seasonal allergies 11/19/2014    Past Surgical History:  Procedure Laterality Date  . EYELID LACERATION REPAIR  2008   left and right  . [place]   right removal -- cancer  . MANDIBLE SURGERY  1987   right reconstruction  . TRANSTHORACIC ECHOCARDIOGRAM  07/18/2002   ef 71%     Medications: Current Meds  Medication Sig  . amLODipine (NORVASC) 5 MG tablet 5 mg daily.   Marland Kitchen amoxicillin (AMOXIL) 500 MG capsule TAKE FOUR CAPSULES BY MOUTH 1 HOUR BEFORE DENTAL APPOINTMENT  . Cholecalciferol (VITAMIN D) 2000 UNITS tablet Take 2,000 Units by mouth daily.    . Coenzyme Q10 (CO Q 10 PO) Take 200 mg by mouth daily.  Marland Kitchen ezetimibe-simvastatin (VYTORIN) 10-40 MG per tablet Take 1 tablet by mouth at bedtime.    . fexofenadine (ALLEGRA) 60 MG tablet Take 60 mg by mouth as needed for allergies or rhinitis.   . fish oil-omega-3 fatty acids 1000 MG capsule Take 1 g by mouth daily.   . meclizine (ANTIVERT) 25 MG tablet Take 25 mg by mouth as needed.  . metoprolol succinate (TOPROL-XL) 25 MG 24  hr tablet TAKE 1/2 TABLET BY MOUTH DAILY  . metroNIDAZOLE (METROGEL) 0.75 % gel Use as directed  . olmesartan (BENICAR) 20 MG tablet Take 20 mg by mouth daily.  . sildenafil (VIAGRA) 50 MG tablet Take 50 mg by mouth daily as needed for erectile dysfunction (erectile dysfunction).  Alveda Reasons 20 MG TABS tablet TAKE 1 TABLET BY MOUTH DAILY   Current Facility-Administered Medications for the 05/08/19 encounter (Office Visit) with Burtis Junes, NP  Medication  . 0.9 %  sodium chloride infusion     Allergies: No Known Allergies  Social History: The patient  reports that he has never smoked. He has never used smokeless tobacco. He reports that he does not drink alcohol or use  drugs.   Family History: The patient's family history includes Heart attack in his father; Hypertension in his father and sister; Stroke in his mother.   Review of Systems: Please see the history of present illness.   All other systems are reviewed and negative.   Physical Exam: VS:  BP 110/70   Pulse 65   Ht 5\' 6"  (1.676 m)   Wt 160 lb (72.6 kg)   SpO2 99%   BMI 25.82 kg/m  .  BMI Body mass index is 25.82 kg/m.  Wt Readings from Last 3 Encounters:  05/08/19 160 lb (72.6 kg)  09/20/18 162 lb 12.8 oz (73.8 kg)  10/10/17 157 lb (71.2 kg)    General: Pleasant. Well developed, well nourished and in no acute distress.   HEENT: Normal. Right temple incision looks good.  Neck: Supple, no JVD, carotid bruits, or masses noted.  Cardiac: Irregular irregular rhythm. Rate is ok. Soft outflow murmur.  No edema.  Respiratory:  Lungs are clear to auscultation bilaterally with normal work of breathing.  GI: Soft and nontender.  MS: No deformity or atrophy. Gait and ROM intact.  Skin: Warm and dry. Color is normal.  Neuro:  Strength and sensation are intact and no gross focal deficits noted.  Psych: Alert, appropriate and with normal affect.   LABORATORY DATA:  EKG:  EKG is not ordered today.   Lab Results  Component Value Date   WBC 5.1 12/17/2011   HGB 13.0 12/17/2011   HCT 38.9 (L) 12/17/2011   PLT 192.0 12/17/2011   GLUCOSE 97 10/04/2012   ALT 20 12/17/2011   AST 25 12/17/2011   NA 139 10/04/2012   K 4.4 10/04/2012   CL 105 10/04/2012   CREATININE 1.0 10/04/2012   BUN 14 10/04/2012   CO2 27 10/04/2012   TSH 2.28 12/17/2011       BNP (last 3 results) No results for input(s): BNP in the last 8760 hours.  ProBNP (last 3 results) No results for input(s): PROBNP in the last 8760 hours.   Other Studies Reviewed Today:  ECHO IMPRESSIONS 09/2018   1. The left ventricle has normal systolic function with an ejection fraction of 60-65%. The cavity size was mildly  dilated. Left ventricular diastolic parameters were normal.  2. The right ventricle has normal systolic function. The cavity was normal. There is no increase in right ventricular wall thickness.  3. Left atrial size was mildly dilated.  4. Right atrial size was mildly dilated.  5. The mitral valve is degenerative. Moderate thickening of the mitral valve leaflet. Moderate calcification of the mitral valve leaflet. There is moderate mitral annular calcification present.  6. Tricuspid valve regurgitation is moderate.  7. The aortic valve has an indeterminate number of cusps  Severely thickening of the aortic valve Severe calcifcation of the aortic valve. Aortic valve regurgitation is mild by color flow Doppler. moderate stenosis of the aortic valve.  8. Functionally bicuspid with fused right and left cusps.  9. There is mild dilatation of the aortic root measuring 38 mm.   Assessment/Plan:  1. Persistent AF - managed with rate control and continued anticoagulation with Xarelto - no issues noted. Doing well clinically. No changes made today.   2. Functionally Bicuspid AV/AS -now with moderate AS per echo from March - along with mild dilatation of the aorta - he has no cardinal symptoms. Will need to follow.   3. HLD - on statin -lipids from April noted.   4. Chronic anticoagulation with Xarelto - no problems noted.Last hemoglobin was stable.   5. HTN - BP is great - no changes made today.   6. Carotid disease - per study from July 2020 due to vision issue - will follow - consider repeat in July of 2021.   7. COVID-19 Education: The signs and symptoms of COVID-19 were discussed with the patient and how to seek care for testing (follow up with PCP or arrange E-visit).  The importance of social distancing, staying at home, hand hygiene and wearing a mask when out in public were discussed today.  Current medicines are reviewed with the patient today.  The patient does not have concerns  regarding medicines other than what has been noted above.  The following changes have been made:  See above.  Labs/ tests ordered today include:   No orders of the defined types were placed in this encounter.    Disposition:   FU with me in 6 months.  Patient is agreeable to this plan and will call if any problems develop in the interim.   SignedTruitt Merle, NP  05/08/2019 8:24 AM  [facility] [address] [place], [place]  [address] Phone: [phone] Fax: [phone]

## 2019-05-08 ENCOUNTER — Ambulatory Visit: Payer: Medicare Other | Admitting: Nurse Practitioner

## 2019-05-08 ENCOUNTER — Encounter: Payer: Self-pay | Admitting: Nurse Practitioner

## 2019-05-08 ENCOUNTER — Other Ambulatory Visit: Payer: Self-pay

## 2019-05-08 VITALS — BP 110/70 | HR 65 | Ht 66.0 in | Wt 160.0 lb

## 2019-05-08 DIAGNOSIS — Z7189 Other specified counseling: Secondary | ICD-10-CM | POA: Diagnosis not present

## 2019-05-08 DIAGNOSIS — Q231 Congenital insufficiency of aortic valve: Secondary | ICD-10-CM

## 2019-05-08 DIAGNOSIS — I482 Chronic atrial fibrillation, unspecified: Secondary | ICD-10-CM | POA: Diagnosis not present

## 2019-05-08 DIAGNOSIS — E7800 Pure hypercholesterolemia, unspecified: Secondary | ICD-10-CM | POA: Diagnosis not present

## 2019-05-08 NOTE — Patient Instructions (Addendum)

## 2019-08-07 ENCOUNTER — Other Ambulatory Visit: Payer: Self-pay | Admitting: Nurse Practitioner

## 2019-08-08 ENCOUNTER — Ambulatory Visit: Payer: Medicare Other

## 2019-08-08 ENCOUNTER — Encounter: Payer: Self-pay | Admitting: *Deleted

## 2019-08-17 ENCOUNTER — Ambulatory Visit: Payer: Medicare Other

## 2019-10-10 NOTE — Progress Notes (Addendum)
CARDIOLOGY OFFICE NOTE  Date:  10/16/2019    Brian Klein Date of Birth: Jan 12, 1946 Medical Record [id number]  PCP:  Crist Infante, MD  Cardiologist:  Servando Snare   Chief Complaint  Patient presents with  . Follow-up    Seen for Dr. Lovena Le    History of Present Illness: Brian Klein is a 74 y.o. male who presents today for a 6 month check.  Former patient of Dr. Sherryl Barters. He is following with meand Dr. Quincy Simmonds forward.   He has had PAF and is on Xarelto and previously was on Flecainide. The patient has a history of a functional bicuspid aortic valve. Other issues include HLD and ED.  I saw him back in [facility] 2017- was doing well but was back in AF and probably had been for at least a month prior to that visit. Asymptomatic. We discussed his options. Event monitor was placed.Ended up referring to Dr. Lovena Le for his recommendations. Dr. Taylorinitiallysuggested continuing hisFlecainide but athis visit in [place] 2018- this was stopped.Last seen by me back this past October and he was doing well. Had had some vision issues and had a carotid doppler - this was ok - 1 to 39% bilateral disease noted. Otherwise, felt to be doing well. Typically does not check his BP at home due to fear it will make him depressed.   The patient does not have symptoms concerning for COVID-19 infection (fever, chills, cough, or new shortness of breath).   Comes in today. Here alone. He is doing well. Walking an hour a day. Feels good with this. No chest pain. Not short of breath. Not dizzy or lightheaded. No syncope. He feels like he is doing well overall. Seeing Dr. Joylene Draft later this month for his labs and physical.    Past Medical History:  Diagnosis Date  . Abnormal CT scan 11/26/2015  . Atrial fibrillation ([place])   . AVD (aortic valve disease)    with bicupsid aortic valve  . Chronic anticoagulation 11/26/2015  . Erectile dysfunction 01/10/2013  . Heart murmur   .  Hypercholesterolemia 12/16/2010  . Hyperlipidemia   . Hypertension   . MVP (mitral valve prolapse)   . 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 PAF (paroxysmal atrial fibrillation) ([place]) 11/26/2015  . Seasonal allergies 11/19/2014    Past Surgical History:  Procedure Laterality Date  . EYELID LACERATION REPAIR  2008   left and right  . [place]   right removal -- cancer  . MANDIBLE SURGERY  1987   right reconstruction  . TRANSTHORACIC ECHOCARDIOGRAM  07/18/2002   ef 71%     Medications: Current Meds  Medication Sig  . amLODipine (NORVASC) 5 MG tablet 5 mg daily.   Marland Kitchen amoxicillin (AMOXIL) 500 MG capsule TAKE 4 CAPSULES BY MOUTH 1 HOUR BEFORE DENTAL APPOINTMENT  . Cholecalciferol (VITAMIN D) 2000 UNITS tablet Take 2,000 Units by mouth daily.    . Coenzyme Q10 (CO Q 10 PO) Take 200 mg by mouth daily.  Marland Kitchen ezetimibe-simvastatin (VYTORIN) 10-40 MG per tablet Take 1 tablet by mouth at bedtime.    . fexofenadine (ALLEGRA) 60 MG tablet Take 60 mg by mouth as needed for allergies or rhinitis.   . fish oil-omega-3 fatty acids 1000 MG capsule Take 1 g by mouth daily.   . fluticasone (FLONASE) 50 MCG/ACT nasal spray Place into both nostrils as needed for allergies or rhinitis.  Marland Kitchen meclizine (ANTIVERT) 25 MG tablet Take 25 mg  by mouth as needed.  . metoprolol succinate (TOPROL-XL) 25 MG 24 hr tablet TAKE 1/2 TABLET BY MOUTH DAILY  . olmesartan (BENICAR) 20 MG tablet Take 20 mg by mouth daily.  . sildenafil (VIAGRA) 50 MG tablet Take 50 mg by mouth daily as needed for erectile dysfunction (erectile dysfunction).  Alveda Reasons 20 MG TABS tablet TAKE 1 TABLET BY MOUTH DAILY   Current Facility-Administered Medications for the 10/16/19 encounter (Office Visit) with Burtis Junes, NP  Medication  . 0.9 %  sodium chloride infusion     Allergies: No Known Allergies  Social History: The patient  reports that he has never smoked. He has never used smokeless tobacco. He reports that he  does not drink alcohol or use drugs.   Family History: The patient's family history includes Heart attack in his father; Hypertension in his father and sister; Stroke in his mother.   Review of Systems: Please see the history of present illness.   All other systems are reviewed and negative.   Physical Exam: VS:  BP 110/70   Pulse 61   Ht 5\' 6"  (1.676 m)   Wt 160 lb 12.8 oz (72.9 kg)   SpO2 100%   BMI 25.95 kg/m  .  BMI Body mass index is 25.95 kg/m.  Wt Readings from Last 3 Encounters:  10/16/19 160 lb 12.8 oz (72.9 kg)  05/08/19 160 lb (72.6 kg)  09/20/18 162 lb 12.8 oz (73.8 kg)    General: Pleasant. Alert and in no acute distress.   Cardiac: Irregular irregular rhythm. His rate is fine. He has an outflow murmur noted. No edema.  Respiratory:  Lungs are clear to auscultation bilaterally with normal work of breathing.  GI: Soft and nontender.  MS: No deformity or atrophy. Gait and ROM intact.  Skin: Warm and dry. Color is normal.  Neuro:  Strength and sensation are intact and no gross focal deficits noted.  Psych: Alert, appropriate and with normal affect.   LABORATORY DATA:  EKG:  EKG is ordered today.  Personally reviewed by me. This demonstrates AF with controlled VR.  Lab Results  Component Value Date   WBC 5.1 12/17/2011   HGB 13.0 12/17/2011   HCT 38.9 (L) 12/17/2011   PLT 192.0 12/17/2011   GLUCOSE 97 10/04/2012   ALT 20 12/17/2011   AST 25 12/17/2011   NA 139 10/04/2012   K 4.4 10/04/2012   CL 105 10/04/2012   CREATININE 1.0 10/04/2012   BUN 14 10/04/2012   CO2 27 10/04/2012   TSH 2.28 12/17/2011       BNP (last 3 results) No results for input(s): BNP in the last 8760 hours.  ProBNP (last 3 results) No results for input(s): PROBNP in the last 8760 hours.   Other Studies Reviewed Today:  ECHO IMPRESSIONS 09/2018  1. The left ventricle has normal systolic function with an ejection fraction of 60-65%. The cavity size was mildly dilated.  Left ventricular diastolic parameters were normal. 2. The right ventricle has normal systolic function. The cavity was normal. There is no increase in right ventricular wall thickness. 3. Left atrial size was mildly dilated. 4. Right atrial size was mildly dilated. 5. The mitral valve is degenerative. Moderate thickening of the mitral valve leaflet. Moderate calcification of the mitral valve leaflet. There is moderate mitral annular calcification present. 6. Tricuspid valve regurgitation is moderate. 7. The aortic valve has an indeterminate number of cusps Severely thickening of the aortic valve Severe calcifcation of the  aortic valve. Aortic valve regurgitation is mild by color flow Doppler. moderate stenosis of the aortic valve. 8. Functionally bicuspid with fused right and left cusps. 9. There is mild dilatation of the aortic root measuring 38 mm.   Assessment/Plan:  1. Persistent AF - managed with rate control and continued anticoagulation - on Xarelto. EKG is stable. He is doing well.   2. Functionally bicuspid AV with moderate AS - needs echo updated - he has no cardinal symptoms.   3. HLD - on statin. Getting lab with PCP later this month  4. Carotid disease - will update this study later in the summer. Had 1 to 39% bilateral stenosis prior.   5. Chronic anticoagulation - no problems noted. Has upcoming labs with PCP.   6. HTN - BP is good - no changes made with his regimen which we will continue. Doing well with CV risk factor modification.   7. COVID-19 Education: The signs and symptoms of COVID-19 were discussed with the patient and how to seek care for testing (follow up with PCP or arrange E-visit).  The importance of social distancing, staying at home, hand hygiene and wearing a mask when out in public were discussed today. He has had both vaccines.   Current medicines are reviewed with the patient today.  The patient does not have concerns regarding medicines other  than what has been noted above.  The following changes have been made:  See above.  Labs/ tests ordered today include:    Orders Placed This Encounter  Procedures  . EKG 12-Lead  . ECHOCARDIOGRAM COMPLETE  . VAS US CAROTID     Disposition:   FU with me in 6 months.   Patient is agreeable to this plan and will call if any problems develop in the interim.   SignedTruitt Merle, NP  10/16/2019 8:37 AM  [facility] [address] [place], [place]  [address] Phone: [phone] Fax: [phone]       Addendum - 11/01/19 His echo has been updated and I asked Dr. Angelena Form to review with me as well -   Angelena Form, Annita Brod, MD sent to Burtis Junes, NP  [place]. I looked at his echo. On his images, his valve looks thick and calcified. By measurement, the dimensionless index is 0.26 (severe is less than 0.25), AVA is 0.9 cm2 (severe less than 0.8cm2) and mean gradient is low with normal LVEF. I think you are right that he is approaching severe but agree is is moderately severe right now. I would repeat an echo in 6 months since he is asymptomatic. Gerald Stabs   We will repeat this study in 6 months.   Burtis Junes, RN, [place] [address] [place],   [address] [phone]

## 2019-10-15 ENCOUNTER — Other Ambulatory Visit: Payer: Self-pay | Admitting: Internal Medicine

## 2019-10-15 NOTE — Telephone Encounter (Signed)
Xarelto 20mg  refill request received. Pt is 74 years old, weight-72.6 kg, Crea-1.10 on 01/22/2019, last seen by Truitt Merle on 05/08/2019, Diagnosis-Afib, CrCl-58.67 ml/min; Dose is appropriate based on dosing criteria. Will send in refill to requested pharmacy.

## 2019-10-16 ENCOUNTER — Other Ambulatory Visit: Payer: Self-pay

## 2019-10-16 ENCOUNTER — Ambulatory Visit: Payer: Medicare PPO | Admitting: Nurse Practitioner

## 2019-10-16 ENCOUNTER — Encounter: Payer: Self-pay | Admitting: Nurse Practitioner

## 2019-10-16 VITALS — BP 110/70 | HR 61 | Ht 66.0 in | Wt 160.8 lb

## 2019-10-16 DIAGNOSIS — Q231 Congenital insufficiency of aortic valve: Secondary | ICD-10-CM | POA: Diagnosis not present

## 2019-10-16 DIAGNOSIS — I350 Nonrheumatic aortic (valve) stenosis: Secondary | ICD-10-CM

## 2019-10-16 DIAGNOSIS — Z8679 Personal history of other diseases of the circulatory system: Secondary | ICD-10-CM

## 2019-10-16 DIAGNOSIS — E7800 Pure hypercholesterolemia, unspecified: Secondary | ICD-10-CM

## 2019-10-16 DIAGNOSIS — I482 Chronic atrial fibrillation, unspecified: Secondary | ICD-10-CM | POA: Diagnosis not present

## 2019-10-16 DIAGNOSIS — Z7189 Other specified counseling: Secondary | ICD-10-CM

## 2019-10-16 DIAGNOSIS — I6523 Occlusion and stenosis of bilateral carotid arteries: Secondary | ICD-10-CM

## 2019-10-16 NOTE — Patient Instructions (Addendum)
After Visit Summary:  We will be checking the following labs today -    Medication Instructions:    Continue with your current medicines.    If you need a refill on your cardiac medications before your next appointment, please call your pharmacy.     Testing/Procedures To Be Arranged:  Echocardiogram  Carotid dopplers in July  Follow-Up:   See me in 6 months.     At [facility], you and your health needs are our priority.  As part of our continuing mission to provide you with exceptional heart care, we have created designated Provider Care Teams.  These Care Teams include your primary Cardiologist (physician) and Advanced Practice Providers (APPs -  Physician Assistants and Nurse Practitioners) who all work together to provide you with the care you need, when you need it.  Special Instructions:  . Stay safe, stay home, wash your hands for at least 20 seconds and wear a mask when out in public.  . It was good to talk with you today.  Marland Kitchen Keep up the good work.    Call the [place] office at [phone] if you have any questions, problems or concerns.

## 2019-11-01 ENCOUNTER — Ambulatory Visit (HOSPITAL_COMMUNITY): Payer: Medicare PPO | Attending: Cardiovascular Disease

## 2019-11-01 ENCOUNTER — Other Ambulatory Visit: Payer: Self-pay

## 2019-11-01 DIAGNOSIS — Z8679 Personal history of other diseases of the circulatory system: Secondary | ICD-10-CM | POA: Insufficient documentation

## 2019-11-01 DIAGNOSIS — I350 Nonrheumatic aortic (valve) stenosis: Secondary | ICD-10-CM | POA: Insufficient documentation

## 2019-11-01 DIAGNOSIS — Q231 Congenital insufficiency of aortic valve: Secondary | ICD-10-CM | POA: Insufficient documentation

## 2019-11-06 ENCOUNTER — Other Ambulatory Visit: Payer: Self-pay | Admitting: *Deleted

## 2019-11-06 ENCOUNTER — Ambulatory Visit: Payer: Medicare Other | Admitting: Nurse Practitioner

## 2019-11-06 DIAGNOSIS — I482 Chronic atrial fibrillation, unspecified: Secondary | ICD-10-CM

## 2019-11-06 DIAGNOSIS — E7800 Pure hypercholesterolemia, unspecified: Secondary | ICD-10-CM

## 2019-11-06 DIAGNOSIS — Q231 Congenital insufficiency of aortic valve: Secondary | ICD-10-CM

## 2019-12-07 ENCOUNTER — Encounter (HOSPITAL_BASED_OUTPATIENT_CLINIC_OR_DEPARTMENT_OTHER): Payer: Self-pay | Admitting: *Deleted

## 2019-12-07 ENCOUNTER — Other Ambulatory Visit: Payer: Self-pay

## 2019-12-07 DIAGNOSIS — Z5321 Procedure and treatment not carried out due to patient leaving prior to being seen by health care provider: Secondary | ICD-10-CM | POA: Diagnosis not present

## 2019-12-07 DIAGNOSIS — R109 Unspecified abdominal pain: Secondary | ICD-10-CM | POA: Diagnosis not present

## 2019-12-07 NOTE — ED Triage Notes (Signed)
Abdominal pain started an hour ago. He has a hernia that is hard to touch. He had a normal BM earlier today.

## 2019-12-07 NOTE — ED Triage Notes (Signed)
Pt's name called for reassessment and no response.

## 2019-12-08 ENCOUNTER — Emergency Department (HOSPITAL_BASED_OUTPATIENT_CLINIC_OR_DEPARTMENT_OTHER)
Admission: EM | Admit: 2019-12-08 | Discharge: 2019-12-08 | Disposition: A | Discharge status: Home / Self Care | Payer: Medicare PPO | Attending: Emergency Medicine | Admitting: Emergency Medicine

## 2020-01-10 DIAGNOSIS — C44219 Basal cell carcinoma of skin of left ear and external auricular canal: Secondary | ICD-10-CM | POA: Diagnosis not present

## 2020-02-11 ENCOUNTER — Ambulatory Visit (HOSPITAL_COMMUNITY)
Admission: RE | Admit: 2020-02-11 | Discharge: 2020-02-11 | Disposition: A | Discharge status: Home / Self Care | Payer: Medicare PPO | Source: Ambulatory Visit | Attending: Cardiology | Admitting: Cardiology

## 2020-02-11 ENCOUNTER — Other Ambulatory Visit: Payer: Self-pay

## 2020-02-11 DIAGNOSIS — I6523 Occlusion and stenosis of bilateral carotid arteries: Secondary | ICD-10-CM

## 2020-02-12 ENCOUNTER — Telehealth: Payer: Self-pay | Admitting: Nurse Practitioner

## 2020-02-12 NOTE — Telephone Encounter (Signed)
New message  Patient is returning call for results of imaging. Please give patient a call back to discuss.

## 2020-04-11 DIAGNOSIS — I350 Nonrheumatic aortic (valve) stenosis: Secondary | ICD-10-CM | POA: Diagnosis not present

## 2020-04-11 DIAGNOSIS — K4090 Unilateral inguinal hernia, without obstruction or gangrene, not specified as recurrent: Secondary | ICD-10-CM | POA: Diagnosis not present

## 2020-04-11 DIAGNOSIS — K439 Ventral hernia without obstruction or gangrene: Secondary | ICD-10-CM | POA: Diagnosis not present

## 2020-04-11 DIAGNOSIS — K432 Incisional hernia without obstruction or gangrene: Secondary | ICD-10-CM | POA: Diagnosis not present

## 2020-04-12 ENCOUNTER — Other Ambulatory Visit: Payer: Self-pay | Admitting: Nurse Practitioner

## 2020-04-13 NOTE — Progress Notes (Signed)
CARDIOLOGY OFFICE NOTE  Date:  04/22/2020     Brian Klein Date of Birth: 07/25/1945 Medical Record #[id number]  PCP:  Crist Infante, MD  Cardiologist:  Servando Snare    Chief Complaint  Patient presents with  . Follow-up    History of Present Illness: Brian Klein is a 74 y.o. male who presents today for a follow up and pre op visit. Former patient of Dr. Sherryl Barters. He is following with meand Dr. Lovena Le.   He has had PAF and is on Xarelto andpreviously was onFlecainide. The patient has a history ofa functionalbicuspid aortic valve. Other issues include HLD and ED.  I saw him back in [facility] 2017- was doing well but was back in AF and probably had been for at least a month prior to that visit. Asymptomatic. We discussed his options. Event monitor was placed.Ended up referring to Dr. Lovena Le for his recommendations. Dr. Taylorinitiallysuggested continuing hisFlecainide but [phone] in Octoberof 2018- this was stopped.Typically does not check his BP at home due to fear it will make him depressed. Last seen in April of [address] - was doing well - walking an hour and day. Echo updated earlier this month.   Comes in today. Here alone. Planning on hernia surgery - not scheduled. Pharmacy has addressed his anticoagulation - hold for 2 days prior. He is doing well. No chest pain. Not short of breath. No syncope and not dizzy. He is walking regularly. Can do over 4 mets. Echo has been updated recently - we have reviewed this today - basically unchanged. He is tolerating his medicines. Overall, he has no concerns.   Past Medical History:  Diagnosis Date  . Abnormal CT scan 11/26/2015  . Atrial fibrillation ([place])   . AVD (aortic valve disease)    with bicupsid aortic valve  . Chronic anticoagulation 11/26/2015  . Erectile dysfunction 01/10/2013  . Heart murmur   . Hypercholesterolemia 12/16/2010  . Hyperlipidemia   . Hypertension   . MVP (mitral valve prolapse)   .  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 PAF (paroxysmal atrial fibrillation) ([place]) 11/26/2015  . Seasonal allergies 11/19/2014    Past Surgical History:  Procedure Laterality Date  . EYELID LACERATION REPAIR  2008   left and right  . [place]   right removal -- cancer  . MANDIBLE SURGERY  1987   right reconstruction  . TRANSTHORACIC ECHOCARDIOGRAM  07/18/2002   ef 71%     Medications: Current Meds  Medication Sig  . amLODipine (NORVASC) 5 MG tablet 5 mg daily.   Marland Kitchen amoxicillin (AMOXIL) 500 MG capsule TAKE 4 CAPSULES BY MOUTH 1 HOUR BEFORE DENTAL APPOINTMENT  . Cholecalciferol (VITAMIN D) 2000 UNITS tablet Take 2,000 Units by mouth daily.    . Coenzyme Q10 (CO Q 10 PO) Take 200 mg by mouth daily.  Marland Kitchen ezetimibe-simvastatin (VYTORIN) 10-40 MG per tablet Take 1 tablet by mouth at bedtime.    . fexofenadine (ALLEGRA) 60 MG tablet Take 60 mg by mouth as needed for allergies or rhinitis.   . fish oil-omega-3 fatty acids 1000 MG capsule Take 1 g by mouth daily.   . fluticasone (FLONASE) 50 MCG/ACT nasal spray Place into both nostrils as needed for allergies or rhinitis.  Marland Kitchen meclizine (ANTIVERT) 25 MG tablet Take 25 mg by mouth as needed.  . metoprolol succinate (TOPROL-XL) 25 MG 24 hr tablet TAKE 1/2 TABLET BY MOUTH DAILY  . olmesartan (BENICAR) 20 MG tablet  Take 20 mg by mouth daily.  . sildenafil (VIAGRA) 50 MG tablet Take 50 mg by mouth daily as needed for erectile dysfunction (erectile dysfunction).  Alveda Reasons 20 MG TABS tablet TAKE 1 TABLET BY MOUTH DAILY   Current Facility-Administered Medications for the 04/22/20 encounter (Office Visit) with Burtis Junes, NP  Medication  . 0.9 %  sodium chloride infusion     Allergies: No Known Allergies  Social History: The patient  reports that he has never smoked. He has never used smokeless tobacco. He reports that he does not drink alcohol and does not use drugs.   Family History: The patient's family history  includes Heart attack in his father; Hypertension in his father and sister; Stroke in his mother.   Review of Systems: Please see the history of present illness.   All other systems are reviewed and negative.   Physical Exam: VS:  BP 112/80   Pulse (!) 55   Ht 5\' 6"  (1.676 m)   Wt 158 lb (71.7 kg)   SpO2 98%   BMI 25.50 kg/m  .  BMI Body mass index is 25.5 kg/m.  Wt Readings from Last 3 Encounters:  04/22/20 158 lb (71.7 kg)  12/07/19 160 lb (72.6 kg)  10/16/19 160 lb 12.8 oz (72.9 kg)    General: Pleasant. Alert and in no acute distress. Looks younger than his stated age.  Cardiac: Irregular irregular rhythm. His rate is fine. Outflow murmur noted.  No edema.  Respiratory:  Lungs are clear to auscultation bilaterally with normal work of breathing.  GI: Soft and nontender.  MS: No deformity or atrophy. Gait and ROM intact.  Skin: Warm and dry. Color is normal.  Neuro:  Strength and sensation are intact and no gross focal deficits noted.  Psych: Alert, appropriate and with normal affect.   LABORATORY DATA:  EKG:  EKG is ordered today.  Personally reviewed by me. This demonstrates AF with VR of 55.  Lab Results  Component Value Date   WBC 5.1 12/17/2011   HGB 13.0 12/17/2011   HCT 38.9 (L) 12/17/2011   PLT 192.0 12/17/2011   GLUCOSE 97 10/04/2012   ALT 20 12/17/2011   AST 25 12/17/2011   NA 139 10/04/2012   K 4.4 10/04/2012   CL 105 10/04/2012   CREATININE 1.0 10/04/2012   BUN 14 10/04/2012   CO2 27 10/04/2012   TSH 2.28 12/17/2011       BNP (last 3 results) No results for input(s): BNP in the last 8760 hours.  ProBNP (last 3 results) No results for input(s): PROBNP in the last 8760 hours.   Other Studies Reviewed Today:  ECHO IMPRESSIONS 04/2020  1. Left ventricular ejection fraction, by estimation, is 60 to 65%. The  left ventricle has normal function. The left ventricle has no regional  wall motion abnormalities. There is mild concentric left  ventricular  hypertrophy. Left ventricular diastolic  function could not be evaluated. The average left ventricular global  longitudinal strain is -13.8 %. The global longitudinal strain is  abnormal.  2. Right ventricular systolic function is normal. The right ventricular  size is normal. There is normal pulmonary artery systolic pressure. The  estimated right ventricular systolic pressure is 41.3 mmHg.  3. Left atrial size was mildly dilated.  4. Right atrial size was moderately dilated.  5. The mitral valve is normal in structure. Mild mitral valve  regurgitation. No evidence of mitral stenosis.  6. Tricuspid valve regurgitation is moderate.  7. The aortic valve is normal in structure. There is severe calcifcation  of the aortic valve. There is severe thickening of the aortic valve.  Aortic valve regurgitation is mild. Moderate aortic valve stenosis. Aortic  valve mean gradient measures 18.0  mmHg.  8. The inferior vena cava is normal in size with greater than 50%  respiratory variability, suggesting right atrial pressure of 3 mmHg.   Comparison(s): No significant change from prior study.    Carotid DopplerSummary 02/2020:  Right Carotid: Velocities in the right ICA are consistent with a 1-39%  stenosis.         Non-hemodynamically significant plaque <50% noted in the  CCA.   Left Carotid: Velocities in the left ICA are consistent with a 1-39%  stenosis.   Vertebrals: Bilateral vertebral arteries demonstrate antegrade flow.  Subclavians: Normal flow hemodynamics were seen in bilateral subclavian        arteries.   Electronically signed by Carlyle Dolly MD on 02/12/2020 at 4:27:20 PM.      Assessment/Plan:  1. Pre op clearance - he is felt to be a satisfactory candidate for his surgery. Pharmacy has advised holding Xarelto 2 days prior and then restart as soon as possible. Will be available as needed.   2. Persistent AF - managed with rate  control and continues on anticoagulation - no problems noted. HR is 55 - he is on half dose Toprol.   3. Functionally bicuspid AV with moderate AS - echo recently updated - he has no cardinal symptoms. He is aware of what to be on the lookout for.   4. HLD - on statin - lab today.   5. Chronic anticoagulation - no problems noted.   6. Carotid stenosis - doppler study from August noted.   7. HTN - BP is fine on his current regimen. No changes made.   Current medicines are reviewed with the patient today.  The patient does not have concerns regarding medicines other than what has been noted above.  The following changes have been made:  See above.  Labs/ tests ordered today include:    Orders Placed This Encounter  Procedures  . Basic metabolic panel  . CBC  . Hepatic function panel  . Lipid panel  . EKG 12-Lead     Disposition:   FU with Dr. Marlou Porch in March as new patient. Mr. Lia is aware that I am leaving in February.   Patient is agreeable to this plan and will call if any problems develop in the interim.   SignedTruitt Merle, NP  04/22/2020 8:41 AM  [facility] [address] [place], [place]  [address] Phone: [phone] Fax: [phone]

## 2020-04-14 NOTE — Telephone Encounter (Signed)
Xarelto 20mg  refill request received. Pt is 74 years old, weight-72.6kg, Crea-1.00 on 11/07/2019 via [facility] from [place], last seen by Truitt Merle on 10/16/2019, Diagnosis-Afib, CrCl-64.59ml/min; Dose is appropriate based on dosing criteria. Will send in refill to requested pharmacy.

## 2020-04-15 ENCOUNTER — Other Ambulatory Visit: Payer: Self-pay

## 2020-04-15 ENCOUNTER — Ambulatory Visit (HOSPITAL_COMMUNITY): Payer: Medicare PPO | Attending: Cardiology

## 2020-04-15 DIAGNOSIS — Q231 Congenital insufficiency of aortic valve: Secondary | ICD-10-CM

## 2020-04-15 DIAGNOSIS — E7800 Pure hypercholesterolemia, unspecified: Secondary | ICD-10-CM | POA: Insufficient documentation

## 2020-04-15 DIAGNOSIS — I482 Chronic atrial fibrillation, unspecified: Secondary | ICD-10-CM | POA: Insufficient documentation

## 2020-04-15 LAB — ECHOCARDIOGRAM COMPLETE
AR max vel: 0.96 cm2
AV Area VTI: 0.93 cm2
AV Area mean vel: 0.87 cm2
AV Mean grad: 18 mmHg
AV Peak grad: 27.2 mmHg
Ao pk vel: 2.61 m/s
Area-P 1/2: 4.08 cm2
MV M vel: 4.44 m/s
MV Peak grad: 78.9 mmHg
P 1/2 time: 814 msec
Radius: 0.4 cm
S' Lateral: 2.7 cm

## 2020-04-18 ENCOUNTER — Telehealth: Payer: Self-pay | Admitting: *Deleted

## 2020-04-18 NOTE — Telephone Encounter (Signed)
   Primary Cardiologist:   Servando Snare   Chart reviewed as part of pre-operative protocol coverage. Because of Brian Klein's past medical history and time since last visit, he will require a follow-up visit in order to better assess preoperative cardiovascular risk.  Pre-op covering staff: - Please schedule appointment and call patient to inform them. If patient already had an upcoming appointment within acceptable timeframe, please add "pre-op clearance" to the appointment notes so provider is aware. - Please contact requesting surgeon's office via preferred method (i.e, phone, fax) to inform them of need for appointment prior to surgery.  If applicable, this message will also be routed to pharmacy pool and/or primary cardiologist for input on holding anticoagulant/antiplatelet agent as requested below so that this information is available to the clearing provider at time of patient's appointment.   Pharmacy to review anticoagulation.   [place], [place]  04/18/2020, 2:02 PM

## 2020-04-18 NOTE — Telephone Encounter (Signed)
Pt has appt with Truitt Merle, NP 04/22/20 for pre op clearance. I will forward to NP for upcoming appt. Will send FYI to requesting office pt has appt 04/22/20. Will remove from the pre op call back pool.

## 2020-04-18 NOTE — Telephone Encounter (Signed)
Patient with diagnosis of afib on Xarelto for anticoagulation.    Procedure: OPEN EPIGASTRIC HERNIA REPAIR WITH MESH  Date of procedure: TBD  CHADS2-VASc score of  3 (HTN, AGE, AGE)  CrCl 56.7 ml/min  Per office protocol, patient can hold Xarelto for 2 days prior to procedure.

## 2020-04-18 NOTE — Telephone Encounter (Signed)
° °  [facility] Pre-operative Risk Assessment    Request for surgical clearance:  1. What type of surgery is being performed?  OPEN EPIGASTRIC HERNIA REPAIR WITH MESH   2. When is this surgery scheduled?  TBD   3. What type of clearance is required (medical clearance vs. Pharmacy clearance to hold med vs. Both)?  BOTH  4. Are there any medications that need to be held prior to surgery and how long? [place]   5. Practice name and name of physician performing surgery?  [facility]  6. What is the office phone number?  [phone]   7.   What is the office fax number?  [phone]  8.   Anesthesia type (None, local, MAC, general) ?  GENERAL   Devra Dopp 04/18/2020, 11:33 AM  _________________________________________________________________   (provider comments below)

## 2020-04-19 DIAGNOSIS — Z23 Encounter for immunization: Secondary | ICD-10-CM | POA: Diagnosis not present

## 2020-04-22 ENCOUNTER — Other Ambulatory Visit: Payer: Self-pay

## 2020-04-22 ENCOUNTER — Ambulatory Visit: Payer: Medicare PPO | Admitting: Nurse Practitioner

## 2020-04-22 ENCOUNTER — Encounter: Payer: Self-pay | Admitting: Nurse Practitioner

## 2020-04-22 VITALS — BP 112/80 | HR 55 | Ht 66.0 in | Wt 158.0 lb

## 2020-04-22 DIAGNOSIS — Q231 Congenital insufficiency of aortic valve: Secondary | ICD-10-CM | POA: Diagnosis not present

## 2020-04-22 DIAGNOSIS — I6523 Occlusion and stenosis of bilateral carotid arteries: Secondary | ICD-10-CM

## 2020-04-22 DIAGNOSIS — I482 Chronic atrial fibrillation, unspecified: Secondary | ICD-10-CM | POA: Diagnosis not present

## 2020-04-22 DIAGNOSIS — E7800 Pure hypercholesterolemia, unspecified: Secondary | ICD-10-CM

## 2020-04-22 DIAGNOSIS — I350 Nonrheumatic aortic (valve) stenosis: Secondary | ICD-10-CM | POA: Diagnosis not present

## 2020-04-22 DIAGNOSIS — Q2381 Bicuspid aortic valve: Secondary | ICD-10-CM

## 2020-04-22 LAB — HEPATIC FUNCTION PANEL
ALT: 18 IU/L (ref 0–44)
AST: 27 IU/L (ref 0–40)
Albumin: 4.5 g/dL (ref 3.7–4.7)
Alkaline Phosphatase: 87 IU/L (ref 44–121)
Bilirubin Total: 0.7 mg/dL (ref 0.0–1.2)
Bilirubin, Direct: 0.19 mg/dL (ref 0.00–0.40)
Total Protein: 7 g/dL (ref 6.0–8.5)

## 2020-04-22 LAB — CBC
Hematocrit: 39.8 % (ref 37.5–51.0)
Hemoglobin: 13.4 g/dL (ref 13.0–17.7)
MCH: 26.7 pg (ref 26.6–33.0)
MCHC: 33.7 g/dL (ref 31.5–35.7)
MCV: 79 fL (ref 79–97)
Platelets: 142 10*3/uL — ABNORMAL LOW (ref 150–450)
RBC: 5.01 x10E6/uL (ref 4.14–5.80)
RDW: 14.3 % (ref 11.6–15.4)
WBC: 4.9 10*3/uL (ref 3.4–10.8)

## 2020-04-22 LAB — LIPID PANEL
Chol/HDL Ratio: 3 ratio (ref 0.0–5.0)
Cholesterol, Total: 113 mg/dL (ref 100–199)
HDL: 38 mg/dL — ABNORMAL LOW (ref >39)
LDL Chol Calc (NIH): 59 mg/dL (ref 0–99)
Triglycerides: 80 mg/dL (ref 0–149)
VLDL Cholesterol Cal: 16 mg/dL (ref 5–40)

## 2020-04-22 LAB — BASIC METABOLIC PANEL
BUN/Creatinine Ratio: 12 (ref 10–24)
BUN: 13 mg/dL (ref 8–27)
CO2: 23 mmol/L (ref 20–29)
Calcium: 9.3 mg/dL (ref 8.6–10.2)
Chloride: 105 mmol/L (ref 96–106)
Creatinine, Ser: 1.1 mg/dL (ref 0.76–1.27)
GFR calc Af Amer: 75 mL/min/{1.73_m2} (ref >59)
GFR calc non Af Amer: 65 mL/min/{1.73_m2} (ref >59)
Glucose: 89 mg/dL (ref 65–99)
Potassium: 4.1 mmol/L (ref 3.5–5.2)
Sodium: 139 mmol/L (ref 134–144)

## 2020-04-22 NOTE — Patient Instructions (Addendum)
After Visit Summary:  We will be checking the following labs today - BMET, CBC, HPF, Lipids   Medication Instructions:    Continue with your current medicines.    If you need a refill on your cardiac medications before your next appointment, please call your pharmacy.     Testing/Procedures To Be Arranged:  N/A  Follow-Up:   See Dr. Marlou Porch as new patient in March.     At [facility], you and your health needs are our priority.  As part of our continuing mission to provide you with exceptional heart care, we have created designated Provider Care Teams.  These Care Teams include your primary Cardiologist (physician) and Advanced Practice Providers (APPs -  Physician Assistants and Nurse Practitioners) who all work together to provide you with the care you need, when you need it.  Special Instructions:  . Stay safe, wash your hands for at least 20 seconds and wear a mask when needed.  . It was good to talk with you today.  . I will send a note over to Dr. Kieth Brightly - we ask that you hold your Xarelto for 2 days prior to your surgery and restart as soon as possible.   Call the [place] office at [phone] if you have any questions, problems or concerns.

## 2020-05-07 ENCOUNTER — Ambulatory Visit: Payer: Self-pay | Admitting: General Surgery

## 2020-05-08 ENCOUNTER — Other Ambulatory Visit: Payer: Self-pay

## 2020-05-08 ENCOUNTER — Encounter (HOSPITAL_COMMUNITY): Payer: Self-pay

## 2020-05-08 ENCOUNTER — Encounter (HOSPITAL_COMMUNITY)
Admission: RE | Admit: 2020-05-08 | Discharge: 2020-05-08 | Disposition: A | Discharge status: Home / Self Care | Payer: Medicare PPO | Source: Ambulatory Visit | Attending: General Surgery | Admitting: General Surgery

## 2020-05-08 DIAGNOSIS — K439 Ventral hernia without obstruction or gangrene: Secondary | ICD-10-CM | POA: Diagnosis not present

## 2020-05-08 DIAGNOSIS — E785 Hyperlipidemia, unspecified: Secondary | ICD-10-CM | POA: Diagnosis not present

## 2020-05-08 DIAGNOSIS — I480 Paroxysmal atrial fibrillation: Secondary | ICD-10-CM | POA: Diagnosis not present

## 2020-05-08 DIAGNOSIS — Z7901 Long term (current) use of anticoagulants: Secondary | ICD-10-CM | POA: Insufficient documentation

## 2020-05-08 DIAGNOSIS — Z01812 Encounter for preprocedural laboratory examination: Secondary | ICD-10-CM | POA: Diagnosis not present

## 2020-05-08 DIAGNOSIS — I10 Essential (primary) hypertension: Secondary | ICD-10-CM | POA: Insufficient documentation

## 2020-05-08 DIAGNOSIS — Z79899 Other long term (current) drug therapy: Secondary | ICD-10-CM | POA: Insufficient documentation

## 2020-05-08 HISTORY — DX: Nonrheumatic aortic (valve) stenosis: I35.0

## 2020-05-08 LAB — BASIC METABOLIC PANEL
Anion gap: 8 (ref 5–15)
BUN: 15 mg/dL (ref 8–23)
CO2: 25 mmol/L (ref 22–32)
Calcium: 9.3 mg/dL (ref 8.9–10.3)
Chloride: 106 mmol/L (ref 98–111)
Creatinine, Ser: 1.14 mg/dL (ref 0.61–1.24)
GFR, Estimated: >60 mL/min (ref >60)
Glucose, Bld: 95 mg/dL (ref 70–99)
Potassium: 4.4 mmol/L (ref 3.5–5.1)
Sodium: 139 mmol/L (ref 135–145)

## 2020-05-08 LAB — CBC
HCT: 40.2 % (ref 39.0–52.0)
Hemoglobin: 13.1 g/dL (ref 13.0–17.0)
MCH: 26.7 pg (ref 26.0–34.0)
MCHC: 32.6 g/dL (ref 30.0–36.0)
MCV: 81.9 fL (ref 80.0–100.0)
Platelets: 135 10*3/uL — ABNORMAL LOW (ref 150–400)
RBC: 4.91 MIL/uL (ref 4.22–5.81)
RDW: 14.3 % (ref 11.5–15.5)
WBC: 4.6 10*3/uL (ref 4.0–10.5)
nRBC: 0 % (ref 0.0–0.2)

## 2020-05-08 NOTE — Progress Notes (Signed)
DUE TO COVID-19 ONLY ONE VISITOR IS ALLOWED TO COME WITH YOU AND STAY IN THE WAITING ROOM ONLY DURING PRE OP AND PROCEDURE DAY OF SURGERY. THE 1 VISITOR  MAY VISIT WITH YOU AFTER SURGERY IN YOUR PRIVATE ROOM DURING VISITING HOURS ONLY!  YOU NEED TO HAVE A COVID 19 TEST ON__11/10/2019 _____ @_______ , THIS TEST MUST BE DONE BEFORE SURGERY,  COVID TESTING SITE [address] [place] [address], IT IS ON THE RIGHT GOING OUT [address] APPROXIMATELY  2 MINUTES PAST [employer] ON THE RIGHT. ONCE YOUR COVID TEST IS COMPLETED,  PLEASE BEGIN THE QUARANTINE INSTRUCTIONS AS OUTLINED IN YOUR HANDOUT.                Brian Klein  05/08/2020   Your procedure is scheduled on: 05/19/2020    Report to [facility] Main  Entrance   Report to admitting at     [place] AM     Call this number if you have problems the morning of surgery [phone]    Remember: Do not eat food , candy gum or mints :After Midnight. You may have clear liquids from midnight until   0415am    CLEAR LIQUID DIET   Foods Allowed                                                                       Coffee and tea, regular and decaf                              Plain Jell-O any favor except red or purple                                            Fruit ices (not with fruit pulp)                                      Iced Popsicles                                     Carbonated beverages, regular and diet                                    Cranberry, grape and apple juices Sports drinks like Gatorade Lightly seasoned clear broth or consume(fat free) Sugar, honey syrup   _____________________________________________________________________    BRUSH YOUR TEETH MORNING OF SURGERY AND RINSE YOUR MOUTH OUT, NO CHEWING GUM CANDY OR MINTS.     Take these medicines the morning of surgery with A SIP OF WATER: amlodipine, toprol   DO NOT TAKE ANY DIABETIC MEDICATIONS DAY OF YOUR SURGERY                                You may not have  any metal on your body including hair pins and              piercings  Do not wear jewelry, make-up, lotions, powders or perfumes, deodorant             Do not wear nail polish on your fingernails.  Do not shave  48 hours prior to surgery.              Men may shave face and neck.   Do not bring valuables to the hospital. [place].  Contacts, dentures or bridgework may not be worn into surgery.  Leave suitcase in the car. After surgery it may be brought to your room.     Patients discharged the day of surgery will not be allowed to drive home. IF YOU ARE HAVING SURGERY AND GOING HOME THE SAME DAY, YOU MUST HAVE AN ADULT TO DRIVE YOU HOME AND BE WITH YOU FOR 24 HOURS. YOU MAY GO HOME BY TAXI OR UBER OR ORTHERWISE, BUT AN ADULT MUST ACCOMPANY YOU HOME AND STAY WITH YOU FOR 24 HOURS.  Name and phone number of your driver:  Special Instructions: N/A              Please read over the following fact sheets you were given: _____________________________________________________________________  [facility] - Preparing for Surgery Before surgery, you can play an important role.  Because skin is not sterile, your skin needs to be as free of germs as possible.  You can reduce the number of germs on your skin by washing with CHG (chlorahexidine gluconate) soap before surgery.  CHG is an antiseptic cleaner which kills germs and bonds with the skin to continue killing germs even after washing. Please DO NOT use if you have an allergy to CHG or antibacterial soaps.  If your skin becomes reddened/irritated stop using the CHG and inform your nurse when you arrive at Short Stay. Do not shave (including legs and underarms) for at least 48 hours prior to the first CHG shower.  You may shave your face/neck. Please follow these instructions carefully:  1.  Shower with CHG Soap the night before surgery and the  morning of  Surgery.  2.  If you choose to wash your hair, wash your hair first as usual with your  normal  shampoo.  3.  After you shampoo, rinse your hair and body thoroughly to remove the  shampoo.                           4.  Use CHG as you would any other liquid soap.  You can apply chg directly  to the skin and wash                       Gently with a scrungie or clean washcloth.  5.  Apply the CHG Soap to your body ONLY FROM THE NECK DOWN.   Do not use on face/ open                           Wound or open sores. Avoid contact with eyes, ears mouth and genitals (private parts).                       Wash  face,  Genitals (private parts) with your normal soap.             6.  Wash thoroughly, paying special attention to the area where your surgery  will be performed.  7.  Thoroughly rinse your body with warm water from the neck down.  8.  DO NOT shower/wash with your normal soap after using and rinsing off  the CHG Soap.                9.  Pat yourself dry with a clean towel.            10.  Wear clean pajamas.            11.  Place clean sheets on your bed the night of your first shower and do not  sleep with pets. Day of Surgery : Do not apply any lotions/deodorants the morning of surgery.  Please wear clean clothes to the hospital/surgery center.  FAILURE TO FOLLOW THESE INSTRUCTIONS MAY RESULT IN THE CANCELLATION OF YOUR SURGERY PATIENT SIGNATURE_________________________________  NURSE SIGNATURE__________________________________  ________________________________________________________________________

## 2020-05-08 NOTE — Progress Notes (Signed)
Anesthesia Review:  PCP: DR mark Perini Cardiologist : 04/22/20- LOV- Truitt Merle, NP - clearance in note  Chest x-ray : EKG :04/22/2020 Carotids- 02/12/2020  Echo :04/15/2020  Stress test: Cardiac Cath :  Activity level: can do a flight of stairs without difficulty  Sleep Study/ CPAP : no  Fasting Blood Sugar :      / Checks Blood Sugar -- times a day:   Blood Thinner/ Instructions /Last Dose: ASA / Instructions/ Last Dose :  Xarelto- pt to stop 2 days prior to surgery per pt

## 2020-05-08 NOTE — Progress Notes (Signed)
NO SOLID FOOD AFTER MIDNIGHT THE NIGHT PRIOR TO SURGERY. NOTHING BY MOUTH EXCEPT CLEAR LIQUIDS UNTIL . PLEASE FINISH ENSURE DRINK PER SURGEON ORDER  WHICH NEEDS TO BE COMPLETED AT .

## 2020-05-12 DIAGNOSIS — Z86008 Personal history of in-situ neoplasm of other site: Secondary | ICD-10-CM | POA: Diagnosis not present

## 2020-05-12 DIAGNOSIS — C44319 Basal cell carcinoma of skin of other parts of face: Secondary | ICD-10-CM | POA: Diagnosis not present

## 2020-05-12 DIAGNOSIS — Z85828 Personal history of other malignant neoplasm of skin: Secondary | ICD-10-CM | POA: Diagnosis not present

## 2020-05-12 DIAGNOSIS — L570 Actinic keratosis: Secondary | ICD-10-CM | POA: Diagnosis not present

## 2020-05-12 DIAGNOSIS — C44329 Squamous cell carcinoma of skin of other parts of face: Secondary | ICD-10-CM | POA: Diagnosis not present

## 2020-05-12 NOTE — Progress Notes (Signed)
Anesthesia Chart Review   Case: [id number] Date/Time: 05/19/20 0900   Procedure: OPEN EPIGASTRIC HERNIA REPAIR WITH MESH (N/A )   Anesthesia type: General   Pre-op diagnosis: EPIGASTRIC HERNIA   Location: [place] [facility] ORS   Surgeons: Kinsinger, Arta Bruce, MD      DISCUSSION:74 y.o. never smoker with h/o HLD, HTN, PAF (on Xarelto), moderate AV stenosis (on Echo 04/15/2020 Aortic valve mean gradient measures 18.0 mmHg. Aortic valve peak gradient measures 27.2 mmHg. Aortic valve area, by VTI measures 0.93 cm), epigastric hernia scheduled for above procedure 05/19/2020 with Dr. Gurney Maxin.   Pt last seen by cardiology 04/22/2020.  Per OV note, "1. Pre op clearance - he is felt to be a satisfactory candidate for his surgery. Pharmacy has advised holding Xarelto 2 days prior and then restart as soon as possible. Will be available as needed.  2. Persistent AF - managed with rate control and continues on anticoagulation - no problems noted. HR is 55 - he is on half dose Toprol.  3. Functionally bicuspid AV with moderate AS - echo recently updated - he has no cardinal symptoms. He is aware of what to be on the lookout for."  Anticipate pt can proceed with planned procedure barring acute status change.   VS: BP 118/81   Pulse 88   Temp 36.6 C (Oral)   Resp 18   Ht 5\' 6"  (1.676 m)   Wt 72.2 kg   SpO2 100%   BMI 25.68 kg/m   PROVIDERS: Crist Infante, MD is PCP    LABS: Labs reviewed: Acceptable for surgery. (all labs ordered are listed, but only abnormal results are displayed)  Labs Reviewed  CBC - Abnormal; Notable for the following components:      Result Value   Platelets 135 (*)    All other components within normal limits  BASIC METABOLIC PANEL     IMAGES:   EKG: 04/22/20 Rate 55 bpm   CV: Echo 04/15/2020 IMPRESSIONS    1. Left ventricular ejection fraction, by estimation, is 60 to 65%. The  left ventricle has normal function. The left ventricle has no regional   wall motion abnormalities. There is mild concentric left ventricular  hypertrophy. Left ventricular diastolic  function could not be evaluated. The average left ventricular global  longitudinal strain is -13.8 %. The global longitudinal strain is  abnormal.  2. Right ventricular systolic function is normal. The right ventricular  size is normal. There is normal pulmonary artery systolic pressure. The  estimated right ventricular systolic pressure is 88.8 mmHg.  3. Left atrial size was mildly dilated.  4. Right atrial size was moderately dilated.  5. The mitral valve is normal in structure. Mild mitral valve  regurgitation. No evidence of mitral stenosis.  6. Tricuspid valve regurgitation is moderate.  7. The aortic valve is normal in structure. There is severe calcifcation  of the aortic valve. There is severe thickening of the aortic valve.  Aortic valve regurgitation is mild. Moderate aortic valve stenosis. Aortic  valve mean gradient measures 18.0  mmHg.  8. The inferior vena cava is normal in size with greater than 50%  respiratory variability, suggesting right atrial pressure of 3 mmHg. Past Medical History:  Diagnosis Date  . Abnormal CT scan 11/26/2015  . Atrial fibrillation ([place])   . AVD (aortic valve disease)    with bicupsid aortic valve  . Chronic anticoagulation 11/26/2015  . Erectile dysfunction 01/10/2013  . Hypercholesterolemia 12/16/2010  . Hyperlipidemia   .  Hypertension   . Mild aortic stenosis   . MVP (mitral valve prolapse)   . 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 PAF (paroxysmal atrial fibrillation) ([place]) 11/26/2015  . Seasonal allergies 11/19/2014    Past Surgical History:  Procedure Laterality Date  . EYELID LACERATION REPAIR  2008   left and right  . [place]   right removal -- cancer  . MANDIBLE SURGERY  1987   right reconstruction  . TRANSTHORACIC ECHOCARDIOGRAM  07/18/2002   ef 71%    MEDICATIONS: . amLODipine  (NORVASC) 5 MG tablet  . amoxicillin (AMOXIL) 500 MG capsule  . Cholecalciferol (VITAMIN D) 2000 UNITS tablet  . Coenzyme Q10 (CO Q 10 PO)  . ezetimibe-simvastatin (VYTORIN) 10-40 MG per tablet  . fexofenadine (ALLEGRA) 60 MG tablet  . fish oil-omega-3 fatty acids 1000 MG capsule  . fluticasone (FLONASE) 50 MCG/ACT nasal spray  . meclizine (ANTIVERT) 25 MG tablet  . metoprolol succinate (TOPROL-XL) 25 MG 24 hr tablet  . olmesartan (BENICAR) 20 MG tablet  . sildenafil (VIAGRA) 50 MG tablet  . XARELTO 20 MG TABS tablet   . 0.9 %  sodium chloride infusion     Konrad Felix, PA-C [facility] Pre-Surgical Testing [phone]

## 2020-05-15 ENCOUNTER — Other Ambulatory Visit (HOSPITAL_COMMUNITY)
Admission: RE | Admit: 2020-05-15 | Discharge: 2020-05-15 | Disposition: A | Discharge status: Home / Self Care | Payer: Medicare PPO | Source: Ambulatory Visit | Attending: General Surgery | Admitting: General Surgery

## 2020-05-15 DIAGNOSIS — Z01812 Encounter for preprocedural laboratory examination: Secondary | ICD-10-CM | POA: Diagnosis not present

## 2020-05-15 DIAGNOSIS — Z20822 Contact with and (suspected) exposure to covid-19: Secondary | ICD-10-CM | POA: Diagnosis not present

## 2020-05-15 LAB — SARS CORONAVIRUS 2 (TAT 6-24 HRS): SARS Coronavirus 2: NEGATIVE

## 2020-05-18 MED ORDER — BUPIVACAINE LIPOSOME 1.3 % IJ SUSP
20.0000 mL | Freq: Once | INTRAMUSCULAR | Status: DC
  Filled 2020-05-18: qty 20

## 2020-05-19 ENCOUNTER — Ambulatory Visit (HOSPITAL_COMMUNITY): Payer: Medicare PPO | Admitting: Physician Assistant

## 2020-05-19 ENCOUNTER — Ambulatory Visit (HOSPITAL_COMMUNITY): Payer: Medicare PPO

## 2020-05-19 ENCOUNTER — Encounter (HOSPITAL_COMMUNITY): Payer: Self-pay | Admitting: General Surgery

## 2020-05-19 ENCOUNTER — Ambulatory Visit (HOSPITAL_COMMUNITY)
Admission: RE | Admit: 2020-05-19 | Discharge: 2020-05-19 | Disposition: A | Discharge status: Home / Self Care | Payer: Medicare PPO | Attending: General Surgery | Admitting: General Surgery

## 2020-05-19 ENCOUNTER — Encounter (HOSPITAL_COMMUNITY)
Admission: RE | Disposition: A | Discharge status: Home / Self Care | Payer: Self-pay | Source: Home / Self Care | Attending: General Surgery

## 2020-05-19 DIAGNOSIS — Z7901 Long term (current) use of anticoagulants: Secondary | ICD-10-CM | POA: Insufficient documentation

## 2020-05-19 DIAGNOSIS — K439 Ventral hernia without obstruction or gangrene: Secondary | ICD-10-CM | POA: Diagnosis not present

## 2020-05-19 DIAGNOSIS — I350 Nonrheumatic aortic (valve) stenosis: Secondary | ICD-10-CM | POA: Diagnosis not present

## 2020-05-19 DIAGNOSIS — Z79899 Other long term (current) drug therapy: Secondary | ICD-10-CM | POA: Insufficient documentation

## 2020-05-19 DIAGNOSIS — I480 Paroxysmal atrial fibrillation: Secondary | ICD-10-CM | POA: Diagnosis not present

## 2020-05-19 DIAGNOSIS — E7800 Pure hypercholesterolemia, unspecified: Secondary | ICD-10-CM | POA: Diagnosis not present

## 2020-05-19 HISTORY — PX: EPIGASTRIC HERNIA REPAIR: SHX404

## 2020-05-19 SURGERY — REPAIR, HERNIA, EPIGASTRIC, ADULT
Anesthesia: General | Site: Abdomen

## 2020-05-19 MED ORDER — ORAL CARE MOUTH RINSE: 15.0000 mL | Freq: Once | OROMUCOSAL | Status: AC

## 2020-05-19 MED ORDER — OXYCODONE HCL 5 MG PO TABS: 5.0000 mg | ORAL_TABLET | Freq: Once | ORAL | Status: DC | PRN

## 2020-05-19 MED ORDER — PHENYLEPHRINE 40 MCG/ML (10ML) SYRINGE FOR IV PUSH (FOR BLOOD PRESSURE SUPPORT)
PREFILLED_SYRINGE | INTRAVENOUS | Status: AC
  Filled 2020-05-19: qty 10

## 2020-05-19 MED ORDER — PHENYLEPHRINE 40 MCG/ML (10ML) SYRINGE FOR IV PUSH (FOR BLOOD PRESSURE SUPPORT)
PREFILLED_SYRINGE | INTRAVENOUS | Status: DC | PRN
  Administered 2020-05-19: 80 ug via INTRAVENOUS
  Administered 2020-05-19 (×2): 120 ug via INTRAVENOUS

## 2020-05-19 MED ORDER — CHLORHEXIDINE GLUCONATE 0.12 % MT SOLN
15.0000 mL | Freq: Once | OROMUCOSAL | Status: AC
  Administered 2020-05-19: 15 mL via OROMUCOSAL

## 2020-05-19 MED ORDER — OXYCODONE HCL 5 MG/5ML PO SOLN: 5.0000 mg | Freq: Once | ORAL | Status: DC | PRN

## 2020-05-19 MED ORDER — OXYCODONE HCL 5 MG PO TABS: 5.0000 mg | ORAL_TABLET | Freq: Four times a day (QID) | ORAL | 0 refills | Status: DC | PRN

## 2020-05-19 MED ORDER — ONDANSETRON HCL 4 MG/2ML IJ SOLN: 4.0000 mg | Freq: Once | INTRAMUSCULAR | Status: DC | PRN

## 2020-05-19 MED ORDER — FENTANYL CITRATE (PF) 100 MCG/2ML IJ SOLN
INTRAMUSCULAR | Status: DC | PRN
  Administered 2020-05-19 (×2): 50 ug via INTRAVENOUS

## 2020-05-19 MED ORDER — KETOROLAC TROMETHAMINE 15 MG/ML IJ SOLN
15.0000 mg | INTRAMUSCULAR | Status: AC
  Administered 2020-05-19: 15 mg via INTRAVENOUS
  Filled 2020-05-19: qty 1

## 2020-05-19 MED ORDER — PROPOFOL 10 MG/ML IV BOLUS
INTRAVENOUS | Status: AC
  Filled 2020-05-19: qty 20

## 2020-05-19 MED ORDER — LIDOCAINE 2% (20 MG/ML) 5 ML SYRINGE
INTRAMUSCULAR | Status: AC
  Filled 2020-05-19: qty 5

## 2020-05-19 MED ORDER — BUPIVACAINE LIPOSOME 1.3 % IJ SUSP
INTRAMUSCULAR | Status: DC | PRN
  Administered 2020-05-19: 20 mL

## 2020-05-19 MED ORDER — AMISULPRIDE (ANTIEMETIC) 5 MG/2ML IV SOLN: 10.0000 mg | Freq: Once | INTRAVENOUS | Status: DC | PRN

## 2020-05-19 MED ORDER — FENTANYL CITRATE (PF) 100 MCG/2ML IJ SOLN
INTRAMUSCULAR | Status: AC
Start: 2020-05-19 — End: ?
  Filled 2020-05-19: qty 2

## 2020-05-19 MED ORDER — CHLORHEXIDINE GLUCONATE CLOTH 2 % EX PADS: 6.0000 | MEDICATED_PAD | Freq: Once | CUTANEOUS | Status: DC

## 2020-05-19 MED ORDER — PROPOFOL 10 MG/ML IV BOLUS
INTRAVENOUS | Status: DC | PRN
  Administered 2020-05-19: 100 mg via INTRAVENOUS
  Administered 2020-05-19: 50 mg via INTRAVENOUS

## 2020-05-19 MED ORDER — ESMOLOL HCL 100 MG/10ML IV SOLN
INTRAVENOUS | Status: AC
  Filled 2020-05-19: qty 10

## 2020-05-19 MED ORDER — DEXAMETHASONE SODIUM PHOSPHATE 10 MG/ML IJ SOLN
INTRAMUSCULAR | Status: AC
  Filled 2020-05-19: qty 1

## 2020-05-19 MED ORDER — SUGAMMADEX SODIUM 200 MG/2ML IV SOLN
INTRAVENOUS | Status: DC | PRN
  Administered 2020-05-19: 200 mg via INTRAVENOUS

## 2020-05-19 MED ORDER — ACETAMINOPHEN 500 MG PO TABS
1000.0000 mg | ORAL_TABLET | ORAL | Status: AC
  Administered 2020-05-19: 1000 mg via ORAL
  Filled 2020-05-19: qty 2

## 2020-05-19 MED ORDER — ESMOLOL HCL 100 MG/10ML IV SOLN
INTRAVENOUS | Status: DC | PRN
  Administered 2020-05-19 (×2): 20 mg via INTRAVENOUS

## 2020-05-19 MED ORDER — LACTATED RINGERS IV SOLN: INTRAVENOUS | Status: DC

## 2020-05-19 MED ORDER — IBUPROFEN 800 MG PO TABS: 800.0000 mg | ORAL_TABLET | Freq: Three times a day (TID) | ORAL | 0 refills | Status: DC | PRN

## 2020-05-19 MED ORDER — 0.9 % SODIUM CHLORIDE (POUR BTL) OPTIME
TOPICAL | Status: DC | PRN
  Administered 2020-05-19: 1000 mL

## 2020-05-19 MED ORDER — FENTANYL CITRATE (PF) 100 MCG/2ML IJ SOLN: 25.0000 ug | INTRAMUSCULAR | Status: DC | PRN

## 2020-05-19 MED ORDER — ONDANSETRON HCL 4 MG/2ML IJ SOLN
INTRAMUSCULAR | Status: DC | PRN
  Administered 2020-05-19: 4 mg via INTRAVENOUS

## 2020-05-19 MED ORDER — ROCURONIUM BROMIDE 10 MG/ML (PF) SYRINGE
PREFILLED_SYRINGE | INTRAVENOUS | Status: DC | PRN
  Administered 2020-05-19: 60 mg via INTRAVENOUS

## 2020-05-19 MED ORDER — ONDANSETRON HCL 4 MG/2ML IJ SOLN
INTRAMUSCULAR | Status: AC
  Filled 2020-05-19: qty 2

## 2020-05-19 MED ORDER — CEFAZOLIN SODIUM-DEXTROSE 2-4 GM/100ML-% IV SOLN
2.0000 g | INTRAVENOUS | Status: AC
  Administered 2020-05-19: 2 g via INTRAVENOUS
  Filled 2020-05-19: qty 100

## 2020-05-19 MED ORDER — ROCURONIUM BROMIDE 10 MG/ML (PF) SYRINGE
PREFILLED_SYRINGE | INTRAVENOUS | Status: AC
  Filled 2020-05-19: qty 10

## 2020-05-19 MED ORDER — DEXAMETHASONE SODIUM PHOSPHATE 10 MG/ML IJ SOLN
INTRAMUSCULAR | Status: DC | PRN
  Administered 2020-05-19: 10 mg via INTRAVENOUS

## 2020-05-19 MED ORDER — LIDOCAINE 2% (20 MG/ML) 5 ML SYRINGE
INTRAMUSCULAR | Status: DC | PRN
  Administered 2020-05-19: 60 mg via INTRAVENOUS

## 2020-05-19 SURGICAL SUPPLY — 38 items
ADH SKN CLS APL DERMABOND .7 (GAUZE/BANDAGES/DRESSINGS) ×1
BINDER ABDOMINAL 12 ML 46-62 (SOFTGOODS) ×2 IMPLANT
BLADE EXTENDED COATED 6.5IN (ELECTRODE) IMPLANT
BLADE HEX COATED 2.75 (ELECTRODE) ×3 IMPLANT
COVER SURGICAL LIGHT HANDLE (MISCELLANEOUS) ×3 IMPLANT
COVER WAND RF STERILE (DRAPES) IMPLANT
DECANTER SPIKE VIAL GLASS SM (MISCELLANEOUS) IMPLANT
DERMABOND ADVANCED (GAUZE/BANDAGES/DRESSINGS) ×2
DERMABOND ADVANCED .7 DNX12 (GAUZE/BANDAGES/DRESSINGS) IMPLANT
DRAPE LAPAROSCOPIC ABDOMINAL (DRAPES) ×3 IMPLANT
ELECT REM PT RETURN 15FT ADLT (MISCELLANEOUS) ×3 IMPLANT
GAUZE SPONGE 4X4 12PLY STRL (GAUZE/BANDAGES/DRESSINGS) ×1 IMPLANT
GLOVE BIOGEL PI IND STRL 7.0 (GLOVE) ×1 IMPLANT
GLOVE BIOGEL PI INDICATOR 7.0 (GLOVE) ×2
GOWN SPEC L4 XLG W/TWL (GOWN DISPOSABLE) ×3 IMPLANT
GOWN STRL REUS W/ TWL XL LVL3 (GOWN DISPOSABLE) ×3 IMPLANT
GOWN STRL REUS W/TWL LRG LVL3 (GOWN DISPOSABLE) ×3 IMPLANT
GOWN STRL REUS W/TWL XL LVL3 (GOWN DISPOSABLE) ×9
KIT BASIN OR (CUSTOM PROCEDURE TRAY) ×3 IMPLANT
KIT TURNOVER KIT A (KITS) ×2 IMPLANT
MESH HERNIA 3X6 (Mesh General) ×2 IMPLANT
NEEDLE HYPO 22GX1.5 SAFETY (NEEDLE) ×2 IMPLANT
PACK GENERAL/GYN (CUSTOM PROCEDURE TRAY) ×3 IMPLANT
PENCIL SMOKE EVACUATOR (MISCELLANEOUS) IMPLANT
SHEARS HARMONIC ACE PLUS 36CM (ENDOMECHANICALS) IMPLANT
STAPLER VISISTAT 35W (STAPLE) ×1 IMPLANT
SUT MNCRL AB 4-0 PS2 18 (SUTURE) ×2 IMPLANT
SUT NOVA NAB GS-22 2 0 T19 (SUTURE) IMPLANT
SUT PDS AB 1 CTX 36 (SUTURE) IMPLANT
SUT PDS AB 2-0 CT2 27 (SUTURE) ×4 IMPLANT
SUT PROLENE 0 CT 2 (SUTURE) IMPLANT
SUT SILK 2 0 (SUTURE)
SUT SILK 2-0 18XBRD TIE 12 (SUTURE) IMPLANT
SUT VIC AB 3-0 SH 18 (SUTURE) ×2 IMPLANT
SYR 20ML LL LF (SYRINGE) ×2 IMPLANT
SYR CONTROL 10ML LL (SYRINGE) IMPLANT
TOWEL OR 17X26 10 PK STRL BLUE (TOWEL DISPOSABLE) ×6 IMPLANT
TRAY FOLEY MTR SLVR 16FR STAT (SET/KITS/TRAYS/PACK) IMPLANT

## 2020-05-19 NOTE — H&P (Signed)
Brian Klein is an 74 y.o. male.   Chief Complaint: hernia HPI: 74 yo male with epigastric hernia. He has pain related to the hernia. The hernia has enlarged in size in the last year.  Past Medical History:  Diagnosis Date  . Abnormal CT scan 11/26/2015  . Atrial fibrillation ([place])   . AVD (aortic valve disease)    with bicupsid aortic valve  . Chronic anticoagulation 11/26/2015  . Erectile dysfunction 01/10/2013  . Hypercholesterolemia 12/16/2010  . Hyperlipidemia   . Hypertension   . Mild aortic stenosis   . MVP (mitral valve prolapse)   . 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 PAF (paroxysmal atrial fibrillation) ([place]) 11/26/2015  . Seasonal allergies 11/19/2014    Past Surgical History:  Procedure Laterality Date  . EYELID LACERATION REPAIR  2008   left and right  . [place]   right removal -- cancer  . MANDIBLE SURGERY  1987   right reconstruction  . TRANSTHORACIC ECHOCARDIOGRAM  07/18/2002   ef 71%    Family History  Problem Relation Age of Onset  . Stroke Mother   . Heart attack Father   . Hypertension Father   . Hypertension Sister   . Colon cancer Neg Hx   . Stomach cancer Neg Hx   . Rectal cancer Neg Hx   . Esophageal cancer Neg Hx    Social History:  reports that he has never smoked. He has never used smokeless tobacco. He reports that he does not drink alcohol and does not use drugs.  Allergies: No Known Allergies  Facility-Administered Medications Prior to Admission  Medication Dose Route Frequency Provider Last Rate Last Admin  . 0.9 %  sodium chloride infusion  500 mL Intravenous Once Irene Shipper, MD       Medications Prior to Admission  Medication Sig Dispense Refill  . amLODipine (NORVASC) 5 MG tablet Take 5 mg by mouth daily.     . Cholecalciferol (VITAMIN D) 2000 UNITS tablet Take 2,000 Units by mouth daily.      . Coenzyme Q10 (CO Q 10 PO) Take 1 capsule by mouth daily.     Marland Kitchen ezetimibe-simvastatin (VYTORIN) 10-40 MG per  tablet Take 1 tablet by mouth at bedtime.      . fish oil-omega-3 fatty acids 1000 MG capsule Take 1 g by mouth daily.     . fluticasone (FLONASE) 50 MCG/ACT nasal spray Place 1 spray into both nostrils daily as needed for allergies or rhinitis.     . metoprolol succinate (TOPROL-XL) 25 MG 24 hr tablet TAKE 1/2 TABLET BY MOUTH DAILY (Patient taking differently: Take 12.5 mg by mouth daily. ) 45 tablet 3  . olmesartan (BENICAR) 20 MG tablet Take 20 mg by mouth daily.    Alveda Reasons 20 MG TABS tablet TAKE 1 TABLET BY MOUTH DAILY (Patient taking differently: Take 20 mg by mouth daily. ) 90 tablet 1  . amoxicillin (AMOXIL) 500 MG capsule TAKE 4 CAPSULES BY MOUTH 1 HOUR BEFORE DENTAL APPOINTMENT (Patient taking differently: Take 2,000 mg by mouth See admin instructions. Take 2000 mg by mouth 1 hour prior to dental appointment) 4 capsule 3  . fexofenadine (ALLEGRA) 60 MG tablet Take 60 mg by mouth as needed for allergies or rhinitis.  (Patient not taking: Reported on 05/06/2020)    . meclizine (ANTIVERT) 25 MG tablet Take 25 mg by mouth as needed. (Patient not taking: Reported on 05/06/2020)    .  sildenafil (VIAGRA) 50 MG tablet Take 50 mg by mouth daily as needed for erectile dysfunction (erectile dysfunction). (Patient not taking: Reported on 05/06/2020)      No results found for this or any previous visit (from the past 48 hour(s)). No results found.  Review of Systems  Constitutional: Negative for chills and fever.  HENT: Negative for hearing loss.   Respiratory: Negative for cough.   Cardiovascular: Negative for chest pain and palpitations.  Gastrointestinal: Negative for abdominal pain, nausea and vomiting.  Genitourinary: Negative for dysuria and urgency.  Musculoskeletal: Negative for myalgias and neck pain.  Skin: Negative for rash.  Neurological: Negative for dizziness and headaches.  Hematological: Does not bruise/bleed easily.  Psychiatric/Behavioral: Negative for suicidal ideas.     Blood pressure 139/86, pulse 82, temperature 97.8 F (36.6 C), temperature source Oral, resp. rate 18, height 5\' 6"  (1.676 m), weight 72.2 kg, SpO2 100 %. Physical Exam Vitals reviewed.  Constitutional:      Appearance: He is well-developed.  HENT:     Head: Normocephalic and atraumatic.  Eyes:     Conjunctiva/sclera: Conjunctivae normal.     Pupils: Pupils are equal, round, and reactive to light.  Cardiovascular:     Rate and Rhythm: Normal rate and regular rhythm.  Pulmonary:     Effort: Pulmonary effort is normal.     Breath sounds: Normal breath sounds.  Abdominal:     General: Bowel sounds are normal. There is no distension.     Palpations: Abdomen is soft.     Tenderness: There is no abdominal tenderness.     Comments: Epigastric hernia  Musculoskeletal:        General: Normal range of motion.     Cervical back: Normal range of motion and neck supple.  Skin:    General: Skin is warm and dry.  Neurological:     Mental Status: He is alert and oriented to person, place, and time.  Psychiatric:        Behavior: Behavior normal.    Assessment/Plan 74 yo male with epigastric hernia -open epigastric hernia repair with mesh -ERAS protocol -outpatient procedure  Mickeal Skinner, MD 05/19/2020, 9:25 AM

## 2020-05-19 NOTE — Anesthesia Postprocedure Evaluation (Signed)
Anesthesia Post Note  Patient: Brian Klein  Procedure(s) Performed: OPEN EPIGASTRIC HERNIA REPAIR WITH MESH (N/A Abdomen)     Patient location during evaluation: PACU Anesthesia Type: General Level of consciousness: awake and alert Pain management: pain level controlled Vital Signs Assessment: post-procedure vital signs reviewed and stable Respiratory status: spontaneous breathing, nonlabored ventilation and respiratory function stable Cardiovascular status: blood pressure returned to baseline and stable Postop Assessment: no apparent nausea or vomiting Anesthetic complications: no   No complications documented.  Last Vitals:  Vitals:   05/19/20 1100 05/19/20 1130  BP: 104/84 (!) 123/95  Pulse: 89 62  Resp: 15 18  Temp: (!) 36.4 C   SpO2: 97% 98%    Last Pain:  Vitals:   05/19/20 1130  TempSrc:   PainSc: 0-No pain                 Lidia Collum

## 2020-05-19 NOTE — Anesthesia Preprocedure Evaluation (Signed)
Anesthesia Evaluation  Patient identified by MRN, date of birth, ID band Patient awake    Reviewed: Allergy & Precautions, NPO status , Patient's Chart, lab work & pertinent test results, reviewed documented beta blocker date and time   History of Anesthesia Complications Negative for: history of anesthetic complications  Airway Mallampati: III  TM Distance: >3 FB Neck ROM: Full   Comment: Previous jaw surgery Dental   Pulmonary neg pulmonary ROS,    Pulmonary exam normal        Cardiovascular Exercise Tolerance: Good hypertension, Pt. on medications and Pt. on home beta blockers Normal cardiovascular exam+ dysrhythmias Atrial Fibrillation + Valvular Problems/Murmurs AS      Neuro/Psych negative neurological ROS  negative psych ROS   GI/Hepatic negative GI ROS, Neg liver ROS,   Endo/Other  negative endocrine ROS  Renal/GU negative Renal ROS  negative genitourinary   Musculoskeletal negative musculoskeletal ROS (+)   Abdominal   Peds  Hematology Xarelto- last dose 11/5   Anesthesia Other Findings  Echo 04/15/20: EF 60-65%, mild LVH, normal RV function, mild MR, mod TR, severe calcification of the AV w/ severe thickening, mild AR, mod AS (mean gradient 18, AVA 0.93 cm2)  Pt last seen by cardiology 04/22/2020.  Per OV note, "1. Pre op clearance - he is felt to be a satisfactory candidate for his surgery. Pharmacy has advised holding Xarelto 2 days prior and then restart as soon as possible. Will be available as needed.  2. Persistent AF - managed with rate control and continues on anticoagulation - no problems noted. HR is 55 - he is on half dose Toprol.  3. Functionally bicuspid AV with moderate AS - echo recently updated - he has no cardinal symptoms. He is aware of what to be on the lookout for."  Reproductive/Obstetrics                            Anesthesia Physical Anesthesia Plan  ASA:  III  Anesthesia Plan: General   Post-op Pain Management:    Induction: Intravenous  PONV Risk Score and Plan: 2 and Ondansetron, Dexamethasone, Treatment may vary due to age or medical condition and Midazolam  Airway Management Planned: Oral ETT and Video Laryngoscope Planned  Additional Equipment: None  Intra-op Plan:   Post-operative Plan: Extubation in OR  Informed Consent: I have reviewed the patients History and Physical, chart, labs and discussed the procedure including the risks, benefits and alternatives for the proposed anesthesia with the patient or authorized representative who has indicated his/her understanding and acceptance.     Dental advisory given  Plan Discussed with:   Anesthesia Plan Comments:        Anesthesia Quick Evaluation

## 2020-05-19 NOTE — Anesthesia Procedure Notes (Signed)
Procedure Name: Intubation Date/Time: 05/19/2020 9:36 AM Performed by: Graydon Fofana D, CRNA Pre-anesthesia Checklist: Patient identified, Emergency Drugs available, Suction available and Patient being monitored Patient Re-evaluated:Patient Re-evaluated prior to induction Oxygen Delivery Method: Circle system utilized Preoxygenation: Pre-oxygenation with 100% oxygen Induction Type: IV induction Ventilation: Mask ventilation without difficulty Laryngoscope Size: Miller and 2 Grade View: Grade II Tube type: Oral Tube size: 7.5 mm Number of attempts: 1 Airway Equipment and Method: Stylet and Oral airway Placement Confirmation: ETT inserted through vocal cords under direct vision,  positive ETCO2 and breath sounds checked- equal and bilateral Secured at: 23 cm Tube secured with: Tape Dental Injury: Teeth and Oropharynx as per pre-operative assessment  Comments: Placed by Hilda Blades

## 2020-05-19 NOTE — Op Note (Signed)
PATIENT:  Brian Klein  74 y.o. male  PRE-OPERATIVE DIAGNOSIS:  EPIGASTRIC HERNIA  POST-OPERATIVE DIAGNOSIS:  EPIGASTRIC HERNIA  PROCEDURE:  Procedure(s): OPEN EPIGASTRIC HERNIA REPAIR WITH MESH, with placement of preperitoneal mesh   SURGEON:  Surgeon(s): Choya Tornow, Arta Bruce, MD Stechschulte, Nickola Major, MD  ASSISTANT: none   ANESTHESIA:   local and general  Indications for procedure: [name] Klein is a 74 y.o. year old male with symptoms of epigastric pain and enlarging hernia in the subxiphoid space.  Description of procedure: The patient was brought into the operative suite. Anesthesia was administered with General endotracheal anesthesia. WHO checklist was applied. The patient was then placed in supine. The area was prepped and draped in the usual sterile fashion.  An upper vertical incision was made. Cautery and blunt dissection was used to dissect down to the fascia. The hernia sac was dissected free from surrounding tissues in 360 degrees. Exparel was injected into the muscle layers. The hernia contained omentum. A portion was removed. The contents of the hernia sac were reduced. The hernia defect was 2 cm in diameter. The hernia sac was reduced. Due to the size of the hernia, 8 x 8 cm Bard mesh was inserted into the preperitoneal space.. The fascial defect was then primarily closed with interrupted 2-0 PDS sutures. The mesh was incorporated to the repair with one stitch. The deep dermal layer was closed with interrupted 3-0 vicryl. The skin was closed with a 4-0 monocryl subcuticular suture. Dermabond was put in place for dressing. The patient awoke from anesthesia and was brought to pacu in stable condition. All counts were correct.  Findings: 2 cm epigastric hernia  Specimen: none  Blood loss: 10 ml  Local anesthesia: 20 ml Exparel  Complications: none  PLAN OF CARE: Discharge to home after PACU  PATIENT DISPOSITION:  PACU - hemodynamically stable.  Gurney Maxin, M.D. General, Bariatric, & Minimally Invasive Surgery [facility], [place]  05/19/2020 10:18 AM

## 2020-05-19 NOTE — Transfer of Care (Signed)
Immediate Anesthesia Transfer of Care Note  Patient: Brian Klein  Procedure(s) Performed: OPEN EPIGASTRIC HERNIA REPAIR WITH MESH (N/A Abdomen)  Patient Location: PACU  Anesthesia Type:General  Level of Consciousness: awake, alert  and oriented  Airway & Oxygen Therapy: Patient Spontanous Breathing and Patient connected to face mask oxygen  Post-op Assessment: Report given to RN and Post -op Vital signs reviewed and stable  Post vital signs: Reviewed and stable  Last Vitals:  Vitals Value Taken Time  BP 132/90 05/19/20 1033  Temp    Pulse 73 05/19/20 1034  Resp 23 05/19/20 1034  SpO2 98 % 05/19/20 1034  Vitals shown include unvalidated device data.  Last Pain:  Vitals:   05/19/20 0756  TempSrc:   PainSc: 0-No pain         Complications: No complications documented.

## 2020-05-19 NOTE — Discharge Instructions (Signed)
[facility] _______[facility] ° °UMBILICAL OR INGUINAL HERNIA REPAIR: POST OP INSTRUCTIONS ° °Always review your discharge instruction sheet given to you by the facility where your surgery was performed. °IF YOU HAVE DISABILITY OR FAMILY LEAVE FORMS, YOU MUST BRING THEM TO THE OFFICE FOR PROCESSING.   °DO NOT GIVE THEM TO YOUR DOCTOR. ° °1. A  prescription for pain medication may be given to you upon discharge.  Take your pain medication as prescribed, if needed.  If narcotic pain medicine is not needed, then you may take acetaminophen (Tylenol) or ibuprofen (Advil) as needed. °2. Take your usually prescribed medications unless otherwise directed. °If you need a refill on your pain medication, please contact your pharmacy.  They will contact our office to request authorization. Prescriptions will not be filled after 5 pm or on week-ends. °3. You should follow a light diet the first 24 hours after arrival home, such as soup and crackers, etc.  Be sure to include lots of fluids daily.  Resume your normal diet the day after surgery. °4.Most patients will experience some swelling and bruising around the umbilicus or in the groin and scrotum.  Ice packs and reclining will help.  Swelling and bruising can take several days to resolve.  °6. It is common to experience some constipation if taking pain medication after surgery.  Increasing fluid intake and taking a stool softener (such as Colace) will usually help or prevent this problem from occurring.  A mild laxative (Milk of Magnesia or Miralax) should be taken according to package directions if there are no bowel movements after 48 hours. °7. Unless discharge instructions indicate otherwise, you may remove your bandages 24-48 hours after surgery, and you may shower at that time.  You may have steri-strips (small skin tapes) in place directly over the incision.  These strips should be left on the skin for 7-10 days.  If your surgeon used skin glue on the  incision, you may shower in 24 hours.  The glue will flake off over the next 2-3 weeks.  Any sutures or staples will be removed at the office during your follow-up visit. °8. ACTIVITIES:  You may resume regular (light) daily activities beginning the next day--such as daily self-care, walking, climbing stairs--gradually increasing activities as tolerated.  You may have sexual intercourse when it is comfortable.  Refrain from any heavy lifting or straining until approved by your doctor. ° °a.You may drive when you are no longer taking prescription pain medication, you can comfortably wear a seatbelt, and you can safely maneuver your car and apply brakes. °b.RETURN TO WORK:   °_____________________________________________ ° °9.You should see your doctor in the office for a follow-up appointment approximately 2-3 weeks after your surgery.  Make sure that you call for this appointment within a day or two after you arrive home to insure a convenient appointment time. °10.OTHER INSTRUCTIONS: _________________________ °   _____________________________________ ° °WHEN TO CALL YOUR DOCTOR: °1. Fever over 101.0 °2. Inability to urinate °3. Nausea and/or vomiting °4. Extreme swelling or bruising °5. Continued bleeding from incision. °6. Increased pain, redness, or drainage from the incision ° °The clinic staff is available to answer your questions during regular business hours.  Please don’t hesitate to call and ask to speak to one of the nurses for clinical concerns.  If you have a medical emergency, go to the nearest emergency room or call 911.  A surgeon from [facility] is always on call at the hospital ° ° °  [address], [address], [place], [place]  [address] ? ° [address] [address], [place], [place]   [address] °[phone] ? [phone] ? FAX [phone] °Web site: [email or web address] °

## 2020-05-20 ENCOUNTER — Encounter (HOSPITAL_COMMUNITY): Payer: Self-pay | Admitting: General Surgery

## 2020-05-27 DIAGNOSIS — H35363 Drusen (degenerative) of macula, bilateral: Secondary | ICD-10-CM | POA: Diagnosis not present

## 2020-05-27 DIAGNOSIS — H524 Presbyopia: Secondary | ICD-10-CM | POA: Diagnosis not present

## 2020-05-27 DIAGNOSIS — H52203 Unspecified astigmatism, bilateral: Secondary | ICD-10-CM | POA: Diagnosis not present

## 2020-05-27 DIAGNOSIS — H5213 Myopia, bilateral: Secondary | ICD-10-CM | POA: Diagnosis not present

## 2020-05-27 DIAGNOSIS — H2513 Age-related nuclear cataract, bilateral: Secondary | ICD-10-CM | POA: Diagnosis not present

## 2020-05-27 DIAGNOSIS — H353131 Nonexudative age-related macular degeneration, bilateral, early dry stage: Secondary | ICD-10-CM | POA: Diagnosis not present

## 2020-06-25 DIAGNOSIS — L218 Other seborrheic dermatitis: Secondary | ICD-10-CM | POA: Diagnosis not present

## 2020-07-14 IMAGING — MR MRI HEAD WITHOUT AND WITH CONTRAST
9 of 15 series · 32 of 48 positions shown · IV contrast (gadavist)
Comparison: None

CLINICAL DATA: Amaurosis fugax of the left eye. Paroxysmal atrial
fibrillation. Episode of vision loss lasting approximately 1 minute
5 days ago.

EXAM:
MRI HEAD WITHOUT AND WITH CONTRAST
TECHNIQUE: Multiplanar, multiecho pulse sequences of the brain and surrounding
structures were obtained without and with intravenous contrast.
CONTRAST:  7 mL Gadavist

[Series 3: DWI · axial · 3.0mm · 1.09mm/px · z∈[-56,+99]mm · 7 of 106 slices shown (1 of 4)]
[im 1/106]
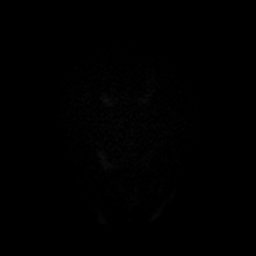
[im 18/106]
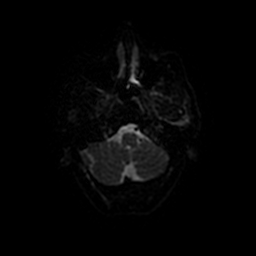
[im 36/106]
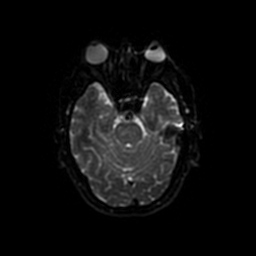
[im 53/106]
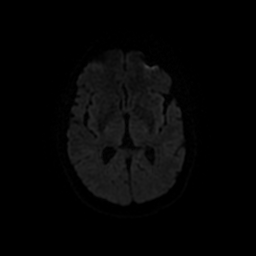
[im 71/106]
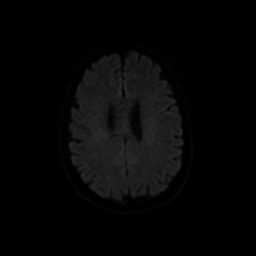
[im 88/106]
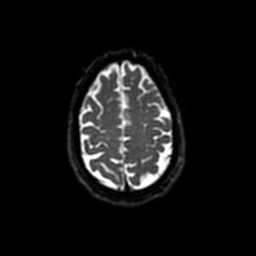
[im 106/106]
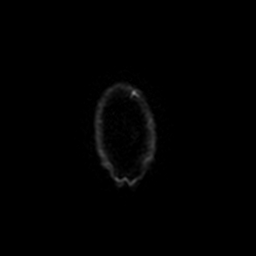

[Series 5: T2 · axial · 5.0mm · 0.43mm/px · 1 of 24 slices shown]
[im 1/24]
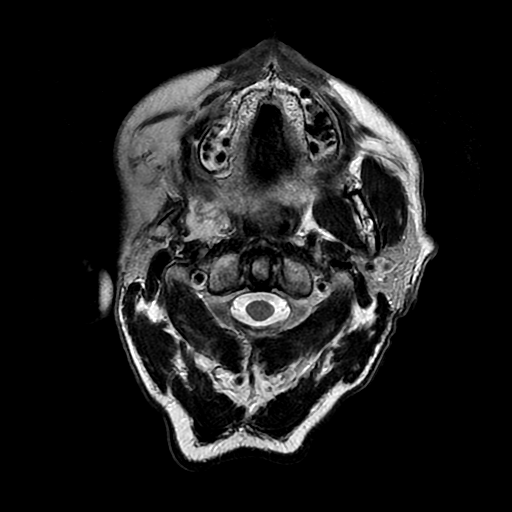

[Series 6: FLAIR · axial · 3.0mm · 0.43mm/px · z∈[-59,+100]mm · 2 of 28 slices shown]
[im 1/28]
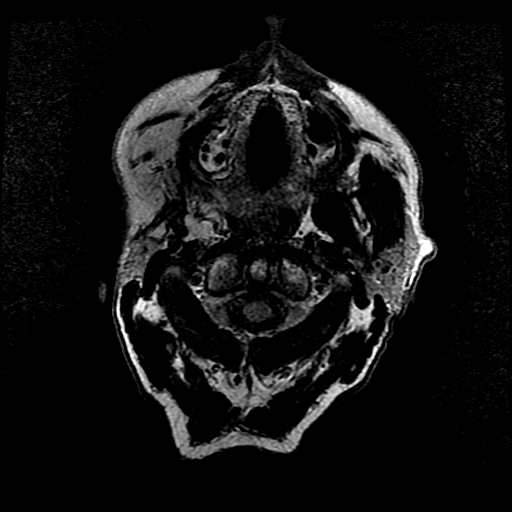
[im 28/28]
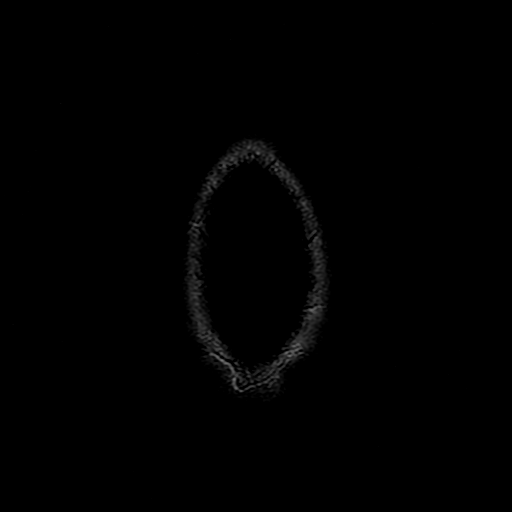

[Series 9: DWI · coronal · 4.0mm · 1.09mm/px · 6 of 86 slices shown (2 of 4)]
[im 1/86]
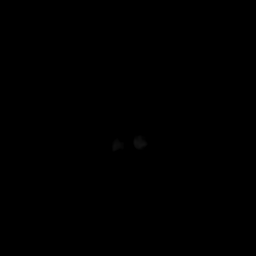
[im 18/86]
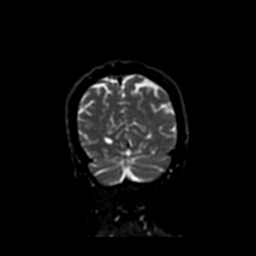
[im 35/86]
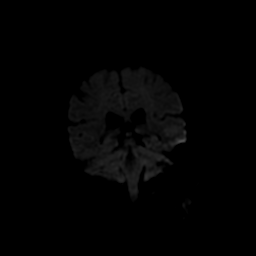
[im 52/86]
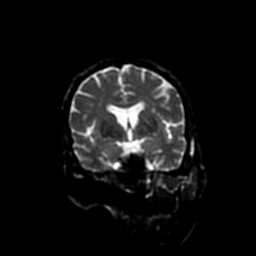
[im 69/86]
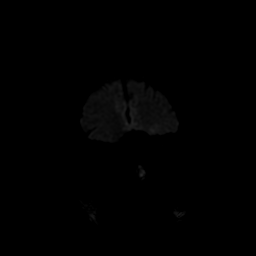
[im 86/86]
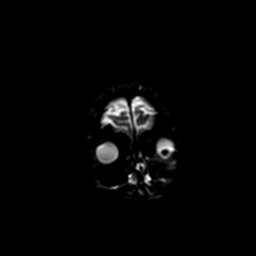

[Series 13: T1 post-contrast · axial · 1.6mm · 0.47mm/px · z∈[-56,+96]mm · 7 of 98 slices shown (1 of 3)]
[im 1/98]
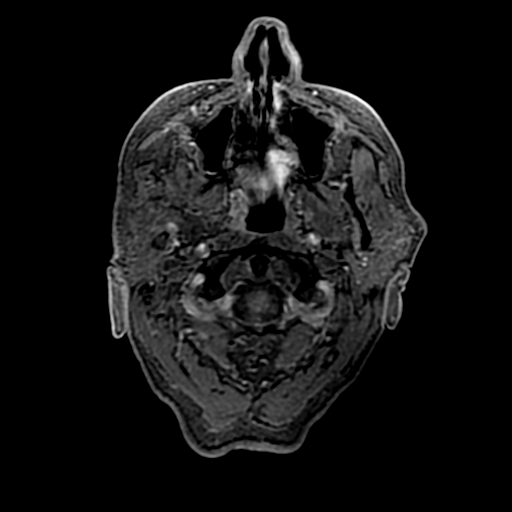
[im 17/98]
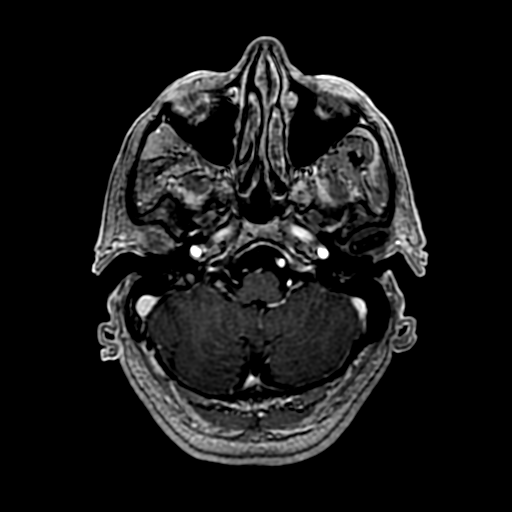
[im 33/98]
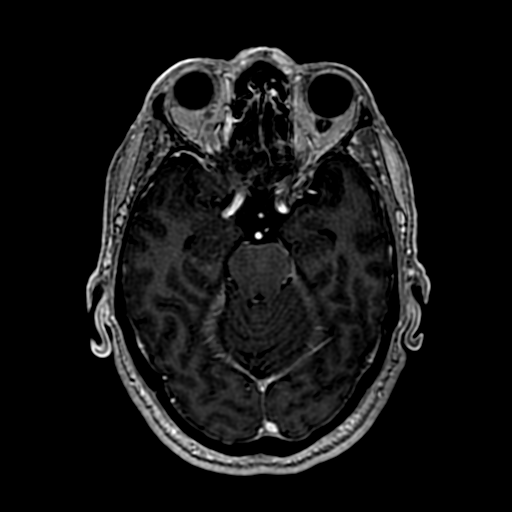
[im 49/98]
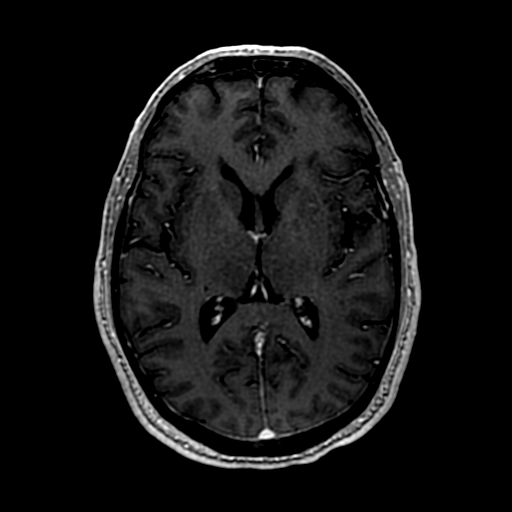
[im 65/98]
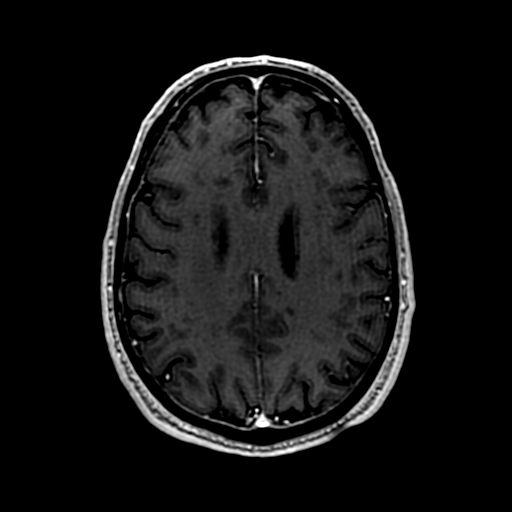
[im 81/98]
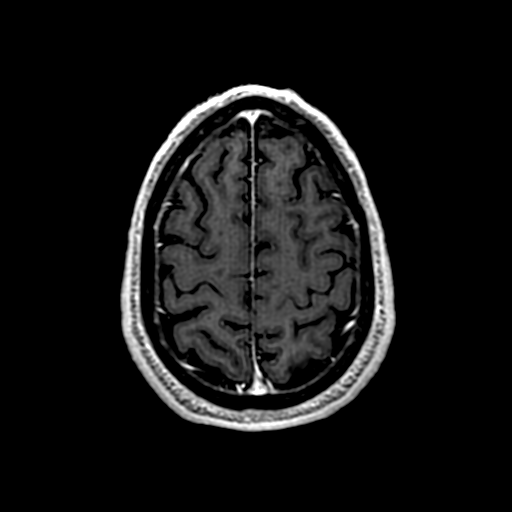
[im 98/98]
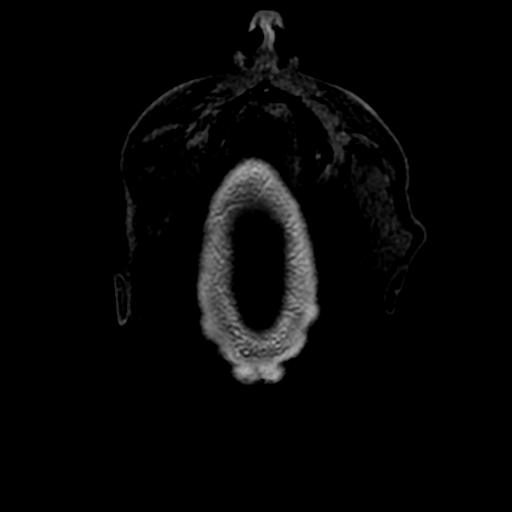

[Series 14: T1 post-contrast · axial · 5.0mm · 0.43mm/px · z∈[-55,+92]mm · 2 of 24 slices shown (2 of 3)]
[im 1/24]
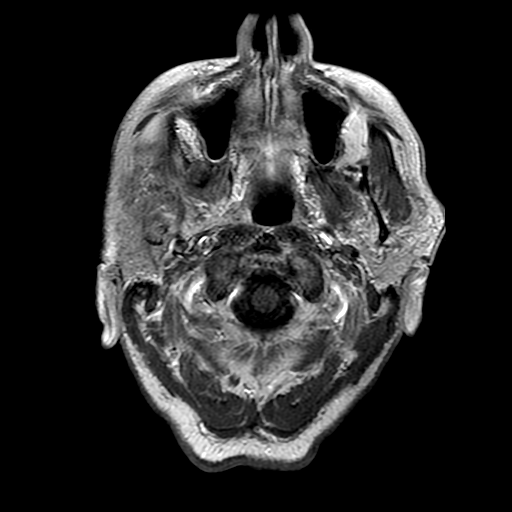
[im 24/24]
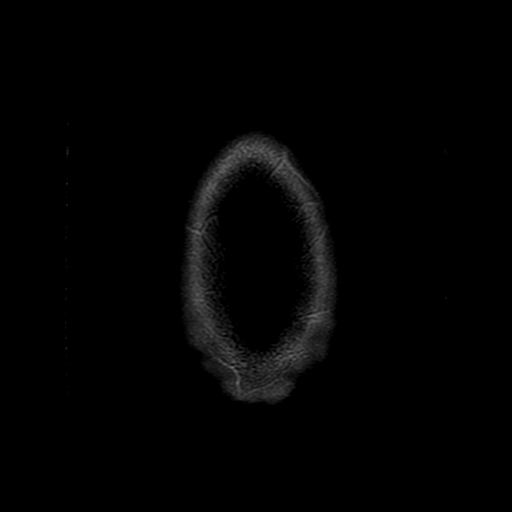

[Series 15: T1 post-contrast · sagittal · 5.0mm · 0.47mm/px · 1 of 20 slices shown (3 of 3)]
[im 1/20]
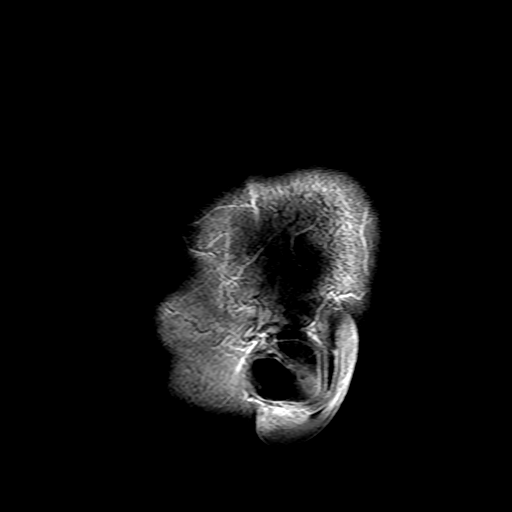

[Series 300: DWI · axial · 3.0mm · 1.09mm/px · z∈[-56,+99]mm · 3 of 50 slices shown (3 of 4)]
[im 1/50]
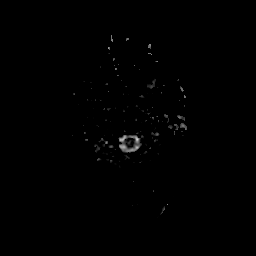
[im 25/50]
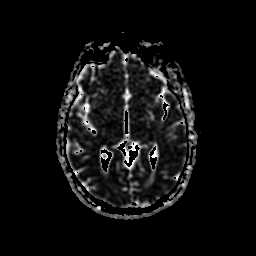
[im 50/50]
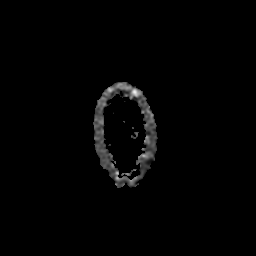

[Series 900: DWI · coronal · 4.0mm · 1.09mm/px · 3 of 43 slices shown (4 of 4)]
[im 1/43]
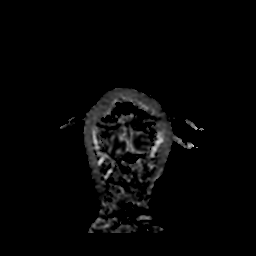
[im 22/43]
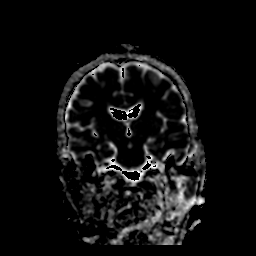
[im 43/43]
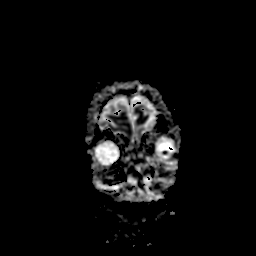

[32 of 48 positions shown; findings below may reference images not displayed]

FINDINGS: Brain: Diffuse periventricular and subcortical T2 hyperintensities
are moderately advanced for age. No restricted diffusion is present.
There is no enhancement associated with these lesions. There is some
white matter change extending into the brainstem. No mass lesion or
hemorrhage is present. The ventricles are proportionate to the
degree of atrophy. No focal cortical abnormalities are present.

Vascular: Flow is present in the major intracranial arteries.

Skull and upper cervical spine: The craniocervical junction is
normal. Upper cervical spine is within normal limits. Marrow signal
is unremarkable.

Sinuses/Orbits: The paranasal sinuses and mastoid air cells are
clear. The globes and orbits are within normal limits.
IMPRESSION: 1. Diffuse periventricular and subcortical T2 hyperintensities are
moderately advanced for age. The finding is nonspecific but can be
seen in the setting of chronic microvascular ischemia, a
demyelinating process such as multiple sclerosis, vasculitis,
complicated migraine headaches, or as the sequelae of a prior
infectious or inflammatory process.
2. No acute or subacute infarct.
3. No focal lesion within the globes or optic tracks to explain
amaurosis fugax.

## 2020-08-18 ENCOUNTER — Other Ambulatory Visit: Payer: Self-pay | Admitting: Nurse Practitioner

## 2020-09-15 DIAGNOSIS — C44319 Basal cell carcinoma of skin of other parts of face: Secondary | ICD-10-CM | POA: Diagnosis not present

## 2020-09-15 DIAGNOSIS — D485 Neoplasm of uncertain behavior of skin: Secondary | ICD-10-CM | POA: Diagnosis not present

## 2020-09-15 DIAGNOSIS — Z85828 Personal history of other malignant neoplasm of skin: Secondary | ICD-10-CM | POA: Diagnosis not present

## 2020-09-15 DIAGNOSIS — L570 Actinic keratosis: Secondary | ICD-10-CM | POA: Diagnosis not present

## 2020-09-26 ENCOUNTER — Ambulatory Visit: Payer: Medicare PPO | Admitting: Cardiology

## 2020-09-26 ENCOUNTER — Other Ambulatory Visit: Payer: Self-pay

## 2020-09-26 ENCOUNTER — Encounter: Payer: Self-pay | Admitting: Cardiology

## 2020-09-26 VITALS — BP 110/70 | HR 78 | Ht 66.0 in | Wt 162.0 lb

## 2020-09-26 DIAGNOSIS — Q231 Congenital insufficiency of aortic valve: Secondary | ICD-10-CM | POA: Diagnosis not present

## 2020-09-26 DIAGNOSIS — I6523 Occlusion and stenosis of bilateral carotid arteries: Secondary | ICD-10-CM | POA: Diagnosis not present

## 2020-09-26 DIAGNOSIS — I482 Chronic atrial fibrillation, unspecified: Secondary | ICD-10-CM

## 2020-09-26 NOTE — Progress Notes (Signed)
Cardiology Office Note:    Date:  09/26/2020   ID:  Brian Klein, DOB 06-22-1946, MRN [id number]  PCP:  Crist Infante, MD   [facility]  Cardiologist:  Candee Furbish, MD  Advanced Practice Provider:  No care team member to display Electrophysiologist:  None       Referring MD: Crist Infante, MD     History of Present Illness:    Brian Klein is a 75 y.o. male here for follow-up of paroxysmal atrial fibrillation as well as bicuspid aortic valve.  Seen by Dr. Lovena Le as well.  Over patient of Dr. Sherryl Barters.  Prior hernia surgery.    Past Medical History:  Diagnosis Date  . Abnormal CT scan 11/26/2015  . Atrial fibrillation ([place])   . AVD (aortic valve disease)    with bicupsid aortic valve  . Chronic anticoagulation 11/26/2015  . Erectile dysfunction 01/10/2013  . Hypercholesterolemia 12/16/2010  . Hyperlipidemia   . Hypertension   . Mild aortic stenosis   . MVP (mitral valve prolapse)   . 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 PAF (paroxysmal atrial fibrillation) ([place]) 11/26/2015  . Seasonal allergies 11/19/2014    Past Surgical History:  Procedure Laterality Date  . EPIGASTRIC HERNIA REPAIR N/A 05/19/2020   Procedure: OPEN EPIGASTRIC HERNIA REPAIR WITH MESH;  Surgeon: Kinsinger, Arta Bruce, MD;  Location: [facility];  Service: General;  Laterality: N/A;  . EYELID LACERATION REPAIR  2008   left and right  . [place]   right removal -- cancer  . MANDIBLE SURGERY  1987   right reconstruction  . TRANSTHORACIC ECHOCARDIOGRAM  07/18/2002   ef 71%    Current Medications: Current Meds  Medication Sig  . amLODipine (NORVASC) 5 MG tablet Take 5 mg by mouth daily.   Marland Kitchen amoxicillin (AMOXIL) 500 MG capsule TAKE 4 CAPSULES BY MOUTH 1 HOUR BEFORE DENTAL APPOINTMENT  . Cholecalciferol (VITAMIN D) 2000 UNITS tablet Take 2,000 Units by mouth daily.  . Coenzyme Q10 (CO Q 10 PO) Take 1 capsule by mouth daily.   Marland Kitchen ezetimibe-simvastatin  (VYTORIN) 10-40 MG per tablet Take 1 tablet by mouth at bedtime.  . fish oil-omega-3 fatty acids 1000 MG capsule Take 1 g by mouth daily.  . fluticasone (FLONASE) 50 MCG/ACT nasal spray Place 1 spray into both nostrils daily as needed for allergies or rhinitis.   Marland Kitchen ibuprofen (ADVIL) 800 MG tablet Take 1 tablet (800 mg total) by mouth every 8 (eight) hours as needed.  . metoprolol succinate (TOPROL-XL) 25 MG 24 hr tablet TAKE 1/2 TABLET BY MOUTH DAILY  . olmesartan (BENICAR) 20 MG tablet Take 20 mg by mouth daily.  Marland Kitchen oxyCODONE (OXY IR/ROXICODONE) 5 MG immediate release tablet Take 1 tablet (5 mg total) by mouth every 6 (six) hours as needed for severe pain.  Marland Kitchen XARELTO 20 MG TABS tablet TAKE 1 TABLET BY MOUTH DAILY     Allergies:   Patient has no known allergies.   Social History   Socioeconomic History  . Marital status: Married    Spouse name: Coralyn Fishel Wamble  . Number of children: 2  . Years of education: Not on file  . Highest education level: Not on file  Occupational History  . Occupation: retired  Tobacco Use  . Smoking status: Never Smoker  . Smokeless tobacco: Never Used  Vaping Use  . Vaping Use: Never used  Substance and Sexual Activity  . Alcohol use: No  .  Drug use: No  . Sexual activity: Not on file  Other Topics Concern  . Not on file  Social History Narrative  . Not on file   Social Determinants of Health   Financial Resource Strain: Not on file  Food Insecurity: Not on file  Transportation Needs: Not on file  Physical Activity: Not on file  Stress: Not on file  Social Connections: Not on file     Family History: The patient's family history includes Heart attack in his father; Hypertension in his father and sister; Stroke in his mother. There is no history of Colon cancer, Stomach cancer, Rectal cancer, or Esophageal cancer.  ROS:   Please see the history of present illness.     All other systems reviewed and are negative.  EKGs/Labs/Other Studies Reviewed:     The following studies were reviewed today: Echocardiogram 2021 -EF 60 to 65% severe calcification of the aortic valve with moderate stenosis with mean gradient of 18 mmHg.  Carotid Dopplers 2021 -Mild disease bilaterally.  Recent Labs: 04/22/2020: ALT 18 05/08/2020: BUN 15; Creatinine, Ser 1.14; Hemoglobin 13.1; Platelets 135; Potassium 4.4; Sodium 139  Recent Lipid Panel    Component Value Date/Time   CHOL 113 04/22/2020 0900   TRIG 80 04/22/2020 0900   HDL 38 (L) 04/22/2020 0900   CHOLHDL 3.0 04/22/2020 0900   LDLCALC 59 04/22/2020 0900     Risk Assessment/Calculations:      Physical Exam:    VS:  BP 110/70 (BP Location: Left Arm, Patient Position: Sitting, Cuff Size: Normal)   Pulse 78   Ht 5\' 6"  (1.676 m)   Wt 162 lb (73.5 kg)   SpO2 96%   BMI 26.15 kg/m     Wt Readings from Last 3 Encounters:  09/26/20 162 lb (73.5 kg)  05/19/20 159 lb 2 oz (72.2 kg)  05/08/20 159 lb 2 oz (72.2 kg)     GEN:  Well nourished, well developed in no acute distress HEENT: Normal NECK: No JVD; No carotid bruits LYMPHATICS: No lymphadenopathy CARDIAC: irreg brady, no murmurs, rubs, gallops RESPIRATORY:  Clear to auscultation without rales, wheezing or rhonchi  ABDOMEN: Soft, non-tender, non-distended MUSCULOSKELETAL:  No edema; No deformity  SKIN: Warm and dry NEUROLOGIC:  Alert and oriented x 3 PSYCHIATRIC:  Normal affect   ASSESSMENT:    1. Bicuspid aortic valve   2. Chronic atrial fibrillation (HCC)   3. Bilateral carotid artery stenosis    PLAN:    In order of problems listed above:  Permanent atrial fibrillation -Rate controlled on anticoagulation heart rate 55 on low-dose Toprol 12.5.  EKG from prior visit reviewed shows heart rate of 54 bpm.  Asymptomatic.  Asked about ablation.  Expressed that usually with asymptomatic individuals we do not need to pursue ablation.  Chronic anticoagulation -On Xarelto 20 mg doing well.  No bleeding.  Last hemoglobin 13.1 in  2021.  Creatinine 1.14.  Bicuspid aortic valve with moderate aortic stenosis -Echocardiogram reviewed as above.  We will continue to monitor.  No indications for replacement at this point.  Doing well with walking.  His wife rides bike.  Has pulmonary fibrosis.  Essential hypertension -Very well controlled.  Hyperlipidemia -On Vytorin.  Asked LDL 59.  Excellent.  No myalgias.  Mild bilateral carotid plaque -Continue with goal-directed medical therapy, Vytorin for instance.  6 mth     Medication Adjustments/Labs and Tests Ordered: Current medicines are reviewed at length with the patient today.  Concerns regarding medicines are outlined  above.  No orders of the defined types were placed in this encounter.  No orders of the defined types were placed in this encounter.   Patient Instructions  Medication Instructions:  Continue current medications as listed.  *If you need a refill on your cardiac medications before your next appointment, please call your pharmacy*  Follow-Up: At [facility], you and your health needs are our priority.  As part of our continuing mission to provide you with exceptional heart care, we have created designated Provider Care Teams.  These Care Teams include your primary Cardiologist (physician) and Advanced Practice Providers (APPs -  Physician Assistants and Nurse Practitioners) who all work together to provide you with the care you need, when you need it.  We recommend signing up for the patient portal called "MyChart".  Sign up information is provided on this After Visit Summary.  MyChart is used to connect with patients for Virtual Visits (Telemedicine).  Patients are able to view lab/test results, encounter notes, upcoming appointments, etc.  Non-urgent messages can be sent to your provider as well.   To learn more about what you can do with MyChart, go to [email or web address].    Your next appointment:   6 month(s)  The format for your next  appointment:   In Person  Provider:   Candee Furbish, MD   Thank you for choosing [facility]!!        Signed, Candee Furbish, MD  09/26/2020 9:27 AM    [place]

## 2020-09-26 NOTE — Patient Instructions (Signed)
Medication Instructions:  Continue current medications as listed.  *If you need a refill on your cardiac medications before your next appointment, please call your pharmacy*  Follow-Up: At [facility], you and your health needs are our priority.  As part of our continuing mission to provide you with exceptional heart care, we have created designated Provider Care Teams.  These Care Teams include your primary Cardiologist (physician) and Advanced Practice Providers (APPs -  Physician Assistants and Nurse Practitioners) who all work together to provide you with the care you need, when you need it.  We recommend signing up for the patient portal called "MyChart".  Sign up information is provided on this After Visit Summary.  MyChart is used to connect with patients for Virtual Visits (Telemedicine).  Patients are able to view lab/test results, encounter notes, upcoming appointments, etc.  Non-urgent messages can be sent to your provider as well.   To learn more about what you can do with MyChart, go to [email or web address].    Your next appointment:   6 month(s)  The format for your next appointment:   In Person  Provider:   Candee Furbish, MD   Thank you for choosing [facility]!!

## 2020-10-09 ENCOUNTER — Other Ambulatory Visit: Payer: Self-pay | Admitting: *Deleted

## 2020-10-09 MED ORDER — RIVAROXABAN 20 MG PO TABS
20.0000 mg | ORAL_TABLET | Freq: Every day | ORAL | 1 refills | Status: DC
Start: 2020-10-09 — End: 2021-03-18

## 2020-10-09 NOTE — Telephone Encounter (Signed)
Prescription refill request for Xarelto received.  Indication: afib Last office visit: skains, 09/26/2020 Weight: 73.5 kg  Age: 75 yo  Scr: 1.14, 05/08/2020 CrCl: 56 ml/min    Pt is on the correct dose of Xarelto per dosing criteria, prescription refill sent for Xarelto 20 mg daily to [facility].

## 2020-11-24 DIAGNOSIS — M7672 Peroneal tendinitis, left leg: Secondary | ICD-10-CM | POA: Diagnosis not present

## 2020-11-25 DIAGNOSIS — H02834 Dermatochalasis of left upper eyelid: Secondary | ICD-10-CM | POA: Diagnosis not present

## 2020-11-25 DIAGNOSIS — H43813 Vitreous degeneration, bilateral: Secondary | ICD-10-CM | POA: Diagnosis not present

## 2020-11-25 DIAGNOSIS — H35033 Hypertensive retinopathy, bilateral: Secondary | ICD-10-CM | POA: Diagnosis not present

## 2020-11-25 DIAGNOSIS — H52203 Unspecified astigmatism, bilateral: Secondary | ICD-10-CM | POA: Diagnosis not present

## 2020-11-25 DIAGNOSIS — H2513 Age-related nuclear cataract, bilateral: Secondary | ICD-10-CM | POA: Diagnosis not present

## 2020-11-25 DIAGNOSIS — H5213 Myopia, bilateral: Secondary | ICD-10-CM | POA: Diagnosis not present

## 2020-11-25 DIAGNOSIS — H02831 Dermatochalasis of right upper eyelid: Secondary | ICD-10-CM | POA: Diagnosis not present

## 2020-11-25 DIAGNOSIS — H35363 Drusen (degenerative) of macula, bilateral: Secondary | ICD-10-CM | POA: Diagnosis not present

## 2020-11-25 DIAGNOSIS — H353131 Nonexudative age-related macular degeneration, bilateral, early dry stage: Secondary | ICD-10-CM | POA: Diagnosis not present

## 2020-12-10 DIAGNOSIS — I10 Essential (primary) hypertension: Secondary | ICD-10-CM | POA: Diagnosis not present

## 2020-12-10 DIAGNOSIS — J40 Bronchitis, not specified as acute or chronic: Secondary | ICD-10-CM | POA: Diagnosis not present

## 2020-12-10 DIAGNOSIS — Z0000 Encounter for general adult medical examination without abnormal findings: Secondary | ICD-10-CM | POA: Diagnosis not present

## 2020-12-10 DIAGNOSIS — E785 Hyperlipidemia, unspecified: Secondary | ICD-10-CM | POA: Diagnosis not present

## 2020-12-10 DIAGNOSIS — J069 Acute upper respiratory infection, unspecified: Secondary | ICD-10-CM | POA: Diagnosis not present

## 2020-12-10 DIAGNOSIS — Z125 Encounter for screening for malignant neoplasm of prostate: Secondary | ICD-10-CM | POA: Diagnosis not present

## 2020-12-10 DIAGNOSIS — R059 Cough, unspecified: Secondary | ICD-10-CM | POA: Diagnosis not present

## 2020-12-10 DIAGNOSIS — I480 Paroxysmal atrial fibrillation: Secondary | ICD-10-CM | POA: Diagnosis not present

## 2020-12-15 DIAGNOSIS — Z85828 Personal history of other malignant neoplasm of skin: Secondary | ICD-10-CM | POA: Diagnosis not present

## 2020-12-15 DIAGNOSIS — C44619 Basal cell carcinoma of skin of left upper limb, including shoulder: Secondary | ICD-10-CM | POA: Diagnosis not present

## 2020-12-15 DIAGNOSIS — L570 Actinic keratosis: Secondary | ICD-10-CM | POA: Diagnosis not present

## 2020-12-15 DIAGNOSIS — L578 Other skin changes due to chronic exposure to nonionizing radiation: Secondary | ICD-10-CM | POA: Diagnosis not present

## 2020-12-16 DIAGNOSIS — I10 Essential (primary) hypertension: Secondary | ICD-10-CM | POA: Diagnosis not present

## 2020-12-16 DIAGNOSIS — E785 Hyperlipidemia, unspecified: Secondary | ICD-10-CM | POA: Diagnosis not present

## 2020-12-16 DIAGNOSIS — Z1331 Encounter for screening for depression: Secondary | ICD-10-CM | POA: Diagnosis not present

## 2020-12-16 DIAGNOSIS — G459 Transient cerebral ischemic attack, unspecified: Secondary | ICD-10-CM | POA: Diagnosis not present

## 2020-12-16 DIAGNOSIS — D6869 Other thrombophilia: Secondary | ICD-10-CM | POA: Diagnosis not present

## 2020-12-16 DIAGNOSIS — Z0000 Encounter for general adult medical examination without abnormal findings: Secondary | ICD-10-CM | POA: Diagnosis not present

## 2020-12-16 DIAGNOSIS — I350 Nonrheumatic aortic (valve) stenosis: Secondary | ICD-10-CM | POA: Diagnosis not present

## 2020-12-16 DIAGNOSIS — I480 Paroxysmal atrial fibrillation: Secondary | ICD-10-CM | POA: Diagnosis not present

## 2020-12-16 DIAGNOSIS — Q231 Congenital insufficiency of aortic valve: Secondary | ICD-10-CM | POA: Diagnosis not present

## 2020-12-17 DIAGNOSIS — R82998 Other abnormal findings in urine: Secondary | ICD-10-CM | POA: Diagnosis not present

## 2020-12-31 DIAGNOSIS — K219 Gastro-esophageal reflux disease without esophagitis: Secondary | ICD-10-CM | POA: Diagnosis not present

## 2020-12-31 DIAGNOSIS — J029 Acute pharyngitis, unspecified: Secondary | ICD-10-CM | POA: Diagnosis not present

## 2020-12-31 DIAGNOSIS — I480 Paroxysmal atrial fibrillation: Secondary | ICD-10-CM | POA: Diagnosis not present

## 2020-12-31 DIAGNOSIS — I10 Essential (primary) hypertension: Secondary | ICD-10-CM | POA: Diagnosis not present

## 2021-01-08 DIAGNOSIS — Z1212 Encounter for screening for malignant neoplasm of rectum: Secondary | ICD-10-CM | POA: Diagnosis not present

## 2021-01-08 LAB — IFOBT (OCCULT BLOOD): IFOBT: NEGATIVE

## 2021-01-20 DIAGNOSIS — D696 Thrombocytopenia, unspecified: Secondary | ICD-10-CM | POA: Diagnosis not present

## 2021-01-20 DIAGNOSIS — E785 Hyperlipidemia, unspecified: Secondary | ICD-10-CM | POA: Diagnosis not present

## 2021-02-03 ENCOUNTER — Encounter: Payer: Self-pay | Admitting: Orthopaedic Surgery

## 2021-02-03 ENCOUNTER — Ambulatory Visit (INDEPENDENT_AMBULATORY_CARE_PROVIDER_SITE_OTHER): Payer: Medicare PPO

## 2021-02-03 ENCOUNTER — Other Ambulatory Visit: Payer: Self-pay

## 2021-02-03 ENCOUNTER — Ambulatory Visit: Payer: Medicare PPO | Admitting: Orthopaedic Surgery

## 2021-02-03 DIAGNOSIS — M542 Cervicalgia: Secondary | ICD-10-CM | POA: Diagnosis not present

## 2021-02-03 MED ORDER — METHOCARBAMOL 500 MG PO TABS: 500.0000 mg | ORAL_TABLET | Freq: Four times a day (QID) | ORAL | 1 refills | Status: DC | PRN

## 2021-02-03 MED ORDER — METHYLPREDNISOLONE 4 MG PO TABS: ORAL_TABLET | ORAL | 0 refills | Status: DC

## 2021-02-03 NOTE — Progress Notes (Signed)
Office Visit Note   Patient: Brian Klein           Date of Birth: 05-25-1946           MRN: [id number] Visit Date: 02/03/2021              Requested by: Crist Infante, MD [address] [place],  [place] [address] PCP: Crist Infante, MD   Assessment & Plan: Visit Diagnoses:  1. Neck pain     Plan: I went over my clinical exam findings with him as well as x-ray findings for his cervical spine.  I have recommended outpatient physical therapy at [facility] which certainly could help decrease his neck pain and improve his motion of the neck.  I am also going to try a 6-day steroid taper and Robaxin.  I also recommended him try just a short course of Voltaren gel which will be safer with him being on Xarelto and he can massage this into the right side of his neck at least twice daily.  All question concerns were answered and addressed.  We will set up physical therapy for him and see him back in 4 weeks.  Follow-Up Instructions: Return in about 4 weeks (around 03/03/2021).   Orders:  Orders Placed This Encounter  Procedures   XR Cervical Spine 2 or 3 views   Meds ordered this encounter  Medications   methylPREDNISolone (MEDROL) 4 MG tablet    Sig: Medrol dose pack. Take as instructed    Dispense:  21 tablet    Refill:  0   methocarbamol (ROBAXIN) 500 MG tablet    Sig: Take 1 tablet (500 mg total) by mouth every 6 (six) hours as needed.    Dispense:  40 tablet    Refill:  1      Procedures: No procedures performed   Clinical Data: No additional findings.   Subjective: Chief Complaint  Patient presents with   Neck - Pain  The patient is a pleasant 75 year old gentleman who comes in with a 55-monthhistory of right-sided neck pain with no radicular component.  He says it does hurt with rotation of his neck and bending his neck.  It hurts up into the lower head area and into the upper shoulder on the right side.  He denies any numbness and tingling in either  hand.  He is right-hand dominant.  He is on Xarelto chronically so he cannot take anti-inflammatories.  He is not a diabetic.  He denies any specific injury to his neck.  HPI  Review of Systems There is currently listed no headache, chest pain, shortness of breath, fever, chills, nausea, vomiting  Objective: Vital Signs: There were no vitals taken for this visit.  Physical Exam He is alert and oriented x3 and in no acute distress Ortho Exam Examination of the cervical spine shows some decreased lateral rotation and lateral bending to the right side and pain in the sternocleidomastoid muscles going up to the occiput just on the right side.  His range of motion with flexion extension is full and there is no midline tenderness and no left-sided tenderness.  He has 5 out of 5 strength of his bilateral upper extremities and normal sensation in all dermatomes. Specialty Comments:  No specialty comments available.  Imaging: XR Cervical Spine 2 or 3 views  Result Date: 02/03/2021 2 views of the cervical spine showed no acute findings.  There is mid cervical degenerative changes.  There is slight  loss of the cervical lordosis on the lateral view.    PMFS History: Patient Active Problem List   Diagnosis Date Noted   Plantar fasciitis, right 09/12/2017   Pain of left heel 04/13/2017   Plantar fasciitis of left foot 04/13/2017   Acute right-sided low back pain without sciatica 04/13/2017   Pain in right hip 04/13/2017   Abnormal CT scan 11/26/2015   Chronic anticoagulation 11/26/2015   PAF (paroxysmal atrial fibrillation) ([place]) 11/26/2015   Seasonal allergies 11/19/2014   Erectile dysfunction 01/10/2013   Atrial fibrillation ([place]) 12/17/2011   Bicuspid aortic valve 12/16/2010   Hypercholesterolemia 12/16/2010   Past Medical History:  Diagnosis Date   Abnormal CT scan 11/26/2015   Atrial fibrillation (HCC)    AVD (aortic valve disease)    with bicupsid aortic valve   Chronic  anticoagulation 11/26/2015   Erectile dysfunction 01/10/2013   Hypercholesterolemia 12/16/2010   Hyperlipidemia    Hypertension    Mild aortic stenosis    MVP (mitral valve prolapse)    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   PAF (paroxysmal atrial fibrillation) ([place]) 11/26/2015   Seasonal allergies 11/19/2014    Family History  Problem Relation Age of Onset   Stroke Mother    Heart attack Father    Hypertension Father    Hypertension Sister    Colon cancer Neg Hx    Stomach cancer Neg Hx    Rectal cancer Neg Hx    Esophageal cancer Neg Hx     Past Surgical History:  Procedure Laterality Date   EPIGASTRIC HERNIA REPAIR N/A 05/19/2020   Procedure: OPEN EPIGASTRIC HERNIA REPAIR WITH MESH;  Surgeon: Kinsinger, Arta Bruce, MD;  Location: [facility];  Service: General;  Laterality: N/A;   EYELID LACERATION REPAIR  2008   left and right   [place]   right removal -- cancer   MANDIBLE SURGERY  1987   right reconstruction   TRANSTHORACIC ECHOCARDIOGRAM  07/18/2002   ef 71%   Social History   Occupational History   Occupation: retired  Tobacco Use   Smoking status: Never   Smokeless tobacco: Never  Vaping Use   Vaping Use: Never used  Substance and Sexual Activity   Alcohol use: No   Drug use: No   Sexual activity: Not on file

## 2021-02-04 ENCOUNTER — Other Ambulatory Visit: Payer: Self-pay

## 2021-02-04 DIAGNOSIS — M542 Cervicalgia: Secondary | ICD-10-CM

## 2021-02-12 DIAGNOSIS — L570 Actinic keratosis: Secondary | ICD-10-CM | POA: Diagnosis not present

## 2021-02-25 ENCOUNTER — Ambulatory Visit: Payer: Medicare PPO | Attending: Orthopaedic Surgery | Admitting: Physical Therapy

## 2021-02-25 ENCOUNTER — Other Ambulatory Visit: Payer: Self-pay

## 2021-02-25 DIAGNOSIS — R293 Abnormal posture: Secondary | ICD-10-CM | POA: Diagnosis not present

## 2021-02-25 DIAGNOSIS — M542 Cervicalgia: Secondary | ICD-10-CM | POA: Insufficient documentation

## 2021-02-25 DIAGNOSIS — M62838 Other muscle spasm: Secondary | ICD-10-CM | POA: Diagnosis not present

## 2021-02-25 DIAGNOSIS — R29898 Other symptoms and signs involving the musculoskeletal system: Secondary | ICD-10-CM | POA: Diagnosis not present

## 2021-02-25 NOTE — Therapy (Addendum)
[facility] [address]  [place], [place], [address] Phone: [phone]   Fax:  [phone]  Physical Therapy Evaluation  Patient Details  Name: Brian Klein MRN: [id number] Date of Birth: [birth year] Referring Provider (PT): [name], MD   Encounter Date: 02/25/2021   PT End of Session - 02/25/21 0931     Visit Number 1    Number of Visits 16    Date for PT Re-Evaluation 04/22/21    Authorization Type [employer] - prior auth required    PT Start Time 0931    PT Stop Time 1020    PT Time Calculation (min) 49 min    Activity Tolerance Patient tolerated treatment well    Behavior During Therapy [facility] for tasks assessed/performed             Past Medical History:  Diagnosis Date   Abnormal CT scan 11/26/2015   Atrial fibrillation ([place])    AVD (aortic valve disease)    with bicupsid aortic valve   Chronic anticoagulation 11/26/2015   Erectile dysfunction 01/10/2013   Hypercholesterolemia 12/16/2010   Hyperlipidemia    Hypertension    Mild aortic stenosis    MVP (mitral valve prolapse)    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    Subjective Assessment - 02/25/21 0935     Subjective Pt reports onset of pain in his neck 4-5 months ago, gradually worsening. Minimal to no pain at rest in neutral position but pain  with movement, esp turning R>L, flexing neck or tilting head. He finds he has to turn his body to accomodate for limited ability to turn his head. MD prescribed prednisone but no benefit noted.    Diagnostic tests 02/03/21 - Neck x-ray: no acute findings.  There is mid cervical degenerative changes.  There is slight loss of the cervical lordosis on the lateral view.    Patient Stated Goals "to have less pain"    Currently in Pain? Yes    Pain Score 0-No pain   up to 0000000 with certain neck motions   Pain Location Neck    Pain Orientation Right;Lower    Pain Descriptors / Indicators Sharp    Pain Type Acute pain    Pain Radiating Towards pain across upper shoulder    Pain Onset More than a month ago   4-5 months   Pain Frequency Intermittent    Aggravating Factors  turning head, side bending or flexing neck    Pain Relieving Factors good posture, heating pad    Effect of Pain on Daily Activities difficulty bending over the sink to shave                [facility] PT Assessment - 02/25/21 0931       Assessment   Medical  Diagnosis Neck pain    Referring Provider (PT) [name], MD    Onset Date/Surgical Date --   4-5 months   Hand Dominance Right    Next MD Visit 03/03/21    Prior Therapy none      Precautions   Precautions None      Restrictions   Weight Bearing Restrictions No      Balance Screen   Has the patient fallen in the past 6 months No    Has the patient had a decrease in activity level because of a fear of falling?  No    Is the patient reluctant to leave their home because of a fear of falling?  No      Home Ecologist residence    Living Arrangements Spouse/significant other      Prior Function   Level of [place] Retired    Leisure keep his grandkids, reading, counseling , walking 1 hr/day      Cognition   Overall Cognitive Status Within Functional Limits for tasks assessed       Observation/Other Assessments   Focus on Therapeutic Outcomes (FOTO)  Neck = 65; predicted D/C FS = 71      Posture/Postural Control   Posture/Postural Control Postural limitations    Postural Limitations Forward head;Rounded Shoulders;Increased thoracic kyphosis      ROM / Strength   AROM / PROM / Strength AROM;Strength      AROM   Overall AROM Comments B shoulder ROM WFL but discomfort in R neck/upper shoulder at end range R FIR    AROM Assessment Site Cervical    Cervical Flexion 48 - tight/pulling    Cervical Extension 40 - no pain    Cervical - Right Side Bend 29    Cervical - Left Side Bend 19 - increased pain    Cervical - Right Rotation 55 - pain    Cervical - Left Rotation 50 - more painful than R rotation      Strength   Overall Strength Within functional limits for tasks performed    Strength Assessment Site Shoulder      Palpation   Spinal mobility hypomoblile lower cervical spine    Palpation comment cervical hypomobility with increased muscle tension t/o cervical paraspinals, R>L UT, LS, and scalenes                        Objective measurements completed on examination: See above findings.       [facility] Adult PT Treatment/Exercise - 02/25/21 0931       Exercises   Exercises Neck      Neck Exercises: Seated   Neck Retraction 5 reps;5 secs    Other Seated Exercise B scap retraction & depression 5 x 5"      Neck Exercises: Supine   Neck Retraction 5 reps;5 secs      Neck Exercises: Stretches   Upper Trapezius Stretch Right;Left;1 rep;30 seconds    Levator Stretch Right;Left;1 rep;30 seconds                    PT Education - 02/25/21 1017     Education Details PT eval findings, anticipated POC & initial HEP - Access Code: [email or web address]    Person(s) Educated Patient    Methods Explanation;Demonstration;Verbal cues;Tactile cues;Handout    Comprehension Verbalized understanding;Verbal cues required;Tactile cues required;Returned  demonstration;Need further instruction  PT Short Term Goals - 02/25/21 1020       PT SHORT TERM GOAL #1   Title Patient will be independent with initial HEP    Status New    Target Date 04/01/21      PT SHORT TERM GOAL #2   Title Patient will verbalize/demonstrate understanding of neutral spine posture and proper body mechanics to reduce strain on cervical spine    Status New    Target Date 04/01/21               PT Long Term Goals - 02/25/21 1020       PT LONG TERM GOAL #1   Title Patient will be independent with ongoing/advanced HEP for self-management at home    Status New    Target Date 04/22/21      PT LONG TERM GOAL #2   Title Improve posture and alignment with patient to demonstrate improved upright posture with posterior shoulder girdle engaged    Status New    Target Date 04/22/21      PT LONG TERM GOAL #3   Title Patient to improve cervical AROM to [facility] without pain provocation    Status New    Target Date 04/22/21      PT LONG TERM GOAL #4   Status New      PT LONG TERM GOAL #5   Title Patient to report ability to perform ADLs including shaving, household, and leisure activities including reading without limitation due to neck pain, LOM or weakness    Status New    Target Date 04/22/21                    Plan - 02/25/21 1020     Clinical Impression Statement Vail is a 75 y/o male who presents to OP PT for 4-5 month h/o neck pain w/o known MOI but gradually worsening. He denies pain at rest when neck in a neutral position but notes pain and limited motion with cervical flexion and R>L rotation and side bending which results in him having to compensate for limited motion with trunk motion. Deficits include abnormal posture, limited and painful cervical AROM, and cervical hypomobility with increased muscle tension t/o cervical paraspinals, R>L UT, LS, and scalenes. Instruction provided in initial HEP with pt noting immediate  benefit in improved ROM with reduction in pain. Jester will benefit from skilled PT to address above deficits to restore pain free functional cervical ROM for normal daily activities.    Personal Factors and Comorbidities Time since onset of injury/illness/exacerbation;Past/Current Experience;Comorbidity 3+    Comorbidities A-fib, MVP, h/o LBP, R hip pain, B plantar fasciitis, remote h/o oteogenic sarcoma of jaw    Examination-Activity Limitations Bend;Lift;Carry;Hygiene/Grooming;Sleep    Examination-Participation Restrictions Cleaning;Community Activity;Driving;Meal Prep    Stability/Clinical Decision Making Stable/Uncomplicated    Clinical Decision Making Low    Rehab Potential Good    PT Frequency 2x / week    PT Duration 8 weeks   6-8 weeks   PT Treatment/Interventions ADLs/Self Care Home Management;Cryotherapy;Electrical Stimulation;Iontophoresis '4mg'$ /ml Dexamethasone;Moist Heat;Traction;Ultrasound;Functional mobility training;Therapeutic activities;Therapeutic exercise;Neuromuscular re-education;Patient/family education;Manual techniques;Passive range of motion;Dry needling;Taping;Spinal Manipulations    PT Next Visit Plan Review initial HEP; reinforce postural awareness; cervical A/AAROM to tolerance; postural strengthening; manual therapy and modalities PRN    PT Home Exercise Plan Access Code: [email or web address] (8/17)    Consulted and Agree with Plan of Care Patient             Patient will  benefit from skilled therapeutic intervention in order to improve the following deficits and impairments:  Decreased activity tolerance, Decreased mobility, Decreased range of motion, Hypomobility, Increased fascial restricitons, Increased muscle spasms, Impaired perceived functional ability, Impaired flexibility, Improper body mechanics, Postural dysfunction, Pain  Visit Diagnosis: Cervicalgia - Plan: PT plan of care cert/re-cert  Abnormal posture - Plan: PT plan of care cert/re-cert  Other symptoms and  signs involving the musculoskeletal system - Plan: PT plan of care cert/re-cert  Other muscle spasm - Plan: PT plan of care cert/re-cert     Problem List Patient Active Problem List   Diagnosis Date Noted   Plantar fasciitis, right 09/12/2017   Pain of left heel 04/13/2017   Plantar fasciitis of left foot 04/13/2017   Acute right-sided low back pain without sciatica 04/13/2017   Pain in right hip 04/13/2017   Abnormal CT scan 11/26/2015   Chronic anticoagulation 11/26/2015   PAF (paroxysmal atrial fibrillation) (HCC) 11/26/2015   Seasonal allergies 11/19/2014   Erectile dysfunction 01/10/2013   Atrial fibrillation ([place]) 12/17/2011   Bicuspid aortic valve 12/16/2010   Hypercholesterolemia 12/16/2010    Percival Spanish, PT, MPT 02/25/2021, 2:49 PM  [facility] [address]  [place], [place], [address] Phone: [phone]   Fax:  [phone]  Name: EMMANUAL RAGANS III MRN: [id number] Date of Birth: [birth year]

## 2021-02-25 NOTE — Patient Instructions (Signed)
      Access Code: [email or web address] URL: [email or web address] Date: 02/25/2021 Prepared by: Annie Paras  Exercises Seated Cervical Retraction - 2-3 x daily - 7 x weekly - 2 sets - 10 reps - 3-5 sec hold Supine Cervical Retraction with Towel - 2-3 x daily - 7 x weekly - 2 sets - 10 reps - 3-5 sec hold Seated Gentle Upper Trapezius Stretch - 2-3 x daily - 7 x weekly - 3 reps - 30 sec hold Gentle Levator Scapulae Stretch - 2-3 x daily - 7 x weekly - 3 reps - 30 sec hold Mid-Lower Cervical Extension SNAG with Strap - 2-3 x daily - 7 x weekly - 2 sets - 10 reps - 3 sec hold Seated Assisted Cervical Rotation with Towel - 2-3 x daily - 7 x weekly - 2 sets - 10 reps - 3 sec hold Seated Scapular Retraction - 2-3 x daily - 7 x weekly - 2 sets - 10 reps - 5 sec hold

## 2021-03-03 ENCOUNTER — Ambulatory Visit: Payer: Medicare PPO | Admitting: Orthopaedic Surgery

## 2021-03-18 ENCOUNTER — Other Ambulatory Visit: Payer: Self-pay

## 2021-03-18 ENCOUNTER — Other Ambulatory Visit: Payer: Self-pay | Admitting: Cardiology

## 2021-03-18 MED ORDER — METOPROLOL SUCCINATE ER 25 MG PO TB24
12.5000 mg | ORAL_TABLET | Freq: Every day | ORAL | 1 refills | Status: DC
Start: 2021-03-18 — End: 2021-09-15

## 2021-03-18 NOTE — Telephone Encounter (Signed)
Prescription refill request for Xarelto received.  Indication:Afib  Last office visit: 09/26/20 Marlou Porch)  Weight: 73.5kg Age: 75 Scr: 1.14 (05/08/20) CrCl: 56.11m/min  Appropriate dose and refill sent to requested pharmacy.

## 2021-03-24 ENCOUNTER — Ambulatory Visit: Payer: Medicare PPO | Attending: Orthopaedic Surgery | Admitting: Physical Therapy

## 2021-03-24 ENCOUNTER — Other Ambulatory Visit: Payer: Self-pay

## 2021-03-24 ENCOUNTER — Encounter: Payer: Self-pay | Admitting: Physical Therapy

## 2021-03-24 DIAGNOSIS — R293 Abnormal posture: Secondary | ICD-10-CM | POA: Insufficient documentation

## 2021-03-24 DIAGNOSIS — M62838 Other muscle spasm: Secondary | ICD-10-CM | POA: Insufficient documentation

## 2021-03-24 DIAGNOSIS — R29898 Other symptoms and signs involving the musculoskeletal system: Secondary | ICD-10-CM | POA: Insufficient documentation

## 2021-03-24 DIAGNOSIS — M542 Cervicalgia: Secondary | ICD-10-CM | POA: Insufficient documentation

## 2021-03-24 NOTE — Therapy (Signed)
[facility] [address]  [place], [place], [address] Phone: [phone]   Fax:  [phone]  Physical Therapy Treatment  Patient Details  Name: Brian Klein MRN: [id number] Date of Birth: June 21, 1946 Referring Provider (PT): [name], MD   Encounter Date: 03/24/2021   PT End of Session - 03/24/21 0805     Visit Number 2    Number of Visits 16    Date for PT Re-Evaluation 04/22/21    Authorization Type [employer] - prior auth required    Authorization Time Period 02/25/21 - 04/22/21    Authorization - Visit Number 1    Authorization - Number of Visits 12    PT Start Time 0805    PT Stop Time 0848    PT Time Calculation (min) 43 min    Activity Tolerance Patient tolerated treatment well    Behavior During Therapy [facility] for tasks assessed/performed             Past Medical History:  Diagnosis Date   Abnormal CT scan 11/26/2015   Atrial fibrillation ([place])    AVD (aortic valve disease)    with bicupsid aortic valve   Chronic anticoagulation 11/26/2015   Erectile dysfunction 01/10/2013   Hypercholesterolemia 12/16/2010   Hyperlipidemia    Hypertension    Mild aortic stenosis    MVP (mitral valve prolapse)    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   Subjective Assessment - 03/24/21 0810     Subjective Pt reports  he has not been very disciplined with the HEP, completing the exercises only ~every 2 days at most but denies any issues with the HEP. He notes "as the day wears on, the pain seems to get less".    Diagnostic tests 02/03/21 - Neck x-ray: no acute findings.  There is mid cervical degenerative changes.  There is slight loss of the cervical lordosis on the lateral view.    Patient Stated Goals "to have less pain"    Currently in Pain? No/denies    Pain Score 0-No pain   up to 5/10 when he turns his head   Pain Orientation Lower;Right    Pain Descriptors / Indicators Sharp    Pain Type Acute pain    Pain Frequency Intermittent                               [facility] Adult PT Treatment/Exercise - 03/24/21 0805       Exercises   Exercises Neck      Neck Exercises: Machines for Strengthening   UBE (Upper Arm Bike) L1.5 x 6 min (3' fwd/3' back)      Neck Exercises: Theraband   Shoulder Extension 10 reps;Red    Shoulder Extension Limitations  standing with band anchored in door - cues for scap retraction & depression, avoiding shoulder shrug    Rows 10 reps;Red    Rows Limitations standing with band anchored in door - cues for scap retraction & depression, avoiding shoulder shrug    Shoulder External Rotation 10 reps;Red    Shoulder External Rotation Limitations standing with back to doorframe as postural cue/reminder for cervical and scapular retraction      Neck Exercises: Seated   Neck Retraction 10 reps;5 secs    Cervical Rotation Right;Left;10 reps    Cervical Rotation Limitations rotation SNAGs with rolled pillowcase    Other Seated Exercise B scap retraction & depression 10 x 5"    Other Seated Exercise Cervical extension SNAGs 10 x 3"      Neck Exercises: Supine   Neck Retraction 10 reps;5 secs      Neck Exercises: Stretches   Upper Trapezius Stretch Right;Left;3 reps;30 seconds    Levator Stretch Right;Left;3 reps;30 seconds                     PT  Education - 03/24/21 0845     Education Details HEP update - postural strengthening - Access Code: [email or web address]    Person(s) Educated Patient    Methods Explanation;Demonstration;Verbal cues;Tactile cues;Handout    Comprehension Verbalized understanding;Verbal cues required;Tactile cues required;Returned demonstration;Need further instruction              PT Short Term Goals - 03/24/21 0814       PT SHORT TERM GOAL #1   Title Patient will be independent with initial HEP    Status On-going    Target Date 04/01/21      PT SHORT TERM GOAL #2   Title Patient will verbalize/demonstrate understanding of neutral spine posture and proper body mechanics to reduce strain on cervical spine    Status On-going    Target Date 04/01/21               PT Long Term Goals - 03/24/21 0816       PT LONG TERM GOAL #1   Title Patient will be independent with ongoing/advanced HEP for self-management at home    Status On-going    Target Date 04/22/21      PT LONG TERM GOAL #2   Title Improve posture and alignment with patient to demonstrate improved upright posture with posterior shoulder girdle engaged    Status On-going    Target Date 04/22/21      PT LONG TERM GOAL #3   Title Patient to improve cervical AROM to [facility] without pain provocation    Status On-going    Target Date 04/22/21      PT LONG TERM GOAL #4   Title Patient to report ability to perform ADLs including shaving, household, and leisure activities including reading without limitation due to neck pain, LOM or weakness    Status On-going    Target Date 04/22/21                   Plan - 03/24/21 0818     Clinical Impression Statement Brian Klein admits to limited compliance with HEP while traveling for vacation to the beach, but denies any issues with the stretches or exercises. HEP review indicating need for clarification of hold times and reps for stretches vs ROM/postural exercises as well as desired movement  pattern/technique with cervical rotation SNAGs - pt reporting improved comfort/understanding of HEP following review. Progressed exercises, introducing  resisted postural strengthening targeting periscapular muscles while reinforcing postural awareness of head position relative to shoulders/torso - pt noting better recognition of how far forward his head is currently and notes reduced tension/pain when able to improve cervical retraction. HEP updated to include postural strengthening exercises.    Comorbidities A-fib, MVP, h/o LBP, R hip pain, B plantar fasciitis, remote h/o oteogenic sarcoma of jaw    Rehab Potential Good    PT Frequency 2x / week    PT Duration 8 weeks   6-8 weeks   PT Treatment/Interventions ADLs/Self Care Home Management;Cryotherapy;Electrical Stimulation;Iontophoresis '4mg'$ /ml Dexamethasone;Moist Heat;Traction;Ultrasound;Functional mobility training;Therapeutic activities;Therapeutic exercise;Neuromuscular re-education;Patient/family education;Manual techniques;Passive range of motion;Dry needling;Taping;Spinal Manipulations    PT Next Visit Plan reinforce postural awareness; cervical A/AAROM to tolerance; postural strengthening; manual therapy and modalities PRN    PT Home Exercise Plan Access Code: [email or web address] (8/17, updated 9/13)    Consulted and Agree with Plan of Care Patient             Patient will benefit from skilled therapeutic intervention in order to improve the following deficits and impairments:  Decreased activity tolerance, Decreased mobility, Decreased range of motion, Hypomobility, Increased fascial restricitons, Increased muscle spasms, Impaired perceived functional ability, Impaired flexibility, Improper body mechanics, Postural dysfunction, Pain  Visit Diagnosis: Cervicalgia  Abnormal posture  Other symptoms and signs involving the musculoskeletal system  Other muscle spasm     Problem List Patient Active Problem List   Diagnosis Date Noted    Plantar fasciitis, right 09/12/2017   Pain of left heel 04/13/2017   Plantar fasciitis of left foot 04/13/2017   Acute right-sided low back pain without sciatica 04/13/2017   Pain in right hip 04/13/2017   Abnormal CT scan 11/26/2015   Chronic anticoagulation 11/26/2015   PAF (paroxysmal atrial fibrillation) (HCC) 11/26/2015   Seasonal allergies 11/19/2014   Erectile dysfunction 01/10/2013   Atrial fibrillation ([place]) 12/17/2011   Bicuspid aortic valve 12/16/2010   Hypercholesterolemia 12/16/2010    Percival Spanish, PT 03/24/2021, 10:07 AM  [facility] [address]  [place], [place], [address] Phone: [phone]   Fax:  [phone]  Name: Brian Klein MRN: [id number] Date of Birth: May 29, 1946

## 2021-03-24 NOTE — Patient Instructions (Signed)
    Access Code: [email or web address] URL: [email or web address] Date: 03/24/2021 Prepared by: Annie Paras  Exercises Seated Cervical Retraction - 2-3 x daily - 7 x weekly - 2 sets - 10 reps - 3-5 sec hold Supine Cervical Retraction with Towel - 2-3 x daily - 7 x weekly - 2 sets - 10 reps - 3-5 sec hold Seated Gentle Upper Trapezius Stretch - 2-3 x daily - 7 x weekly - 3 reps - 30 sec hold Gentle Levator Scapulae Stretch - 2-3 x daily - 7 x weekly - 3 reps - 30 sec hold Mid-Lower Cervical Extension SNAG with Strap - 2-3 x daily - 7 x weekly - 2 sets - 10 reps - 3 sec hold Seated Assisted Cervical Rotation with Towel - 2-3 x daily - 7 x weekly - 2 sets - 10 reps - 3 sec hold Seated Scapular Retraction - 2-3 x daily - 7 x weekly - 2 sets - 10 reps - 5 sec hold Standing Shoulder Row with Anchored Resistance - 1 x daily - 7 x weekly - 2 sets - 10 reps - 5 sec hold Scapular Retraction with Resistance Advanced - 1 x daily - 7 x weekly - 2 sets - 10 reps - 5 sec hold Shoulder External Rotation and Scapular Retraction with Resistance - 1 x daily - 7 x weekly - 2 sets - 10 reps - 3-5 sec hold

## 2021-03-26 ENCOUNTER — Ambulatory Visit: Payer: Medicare PPO | Admitting: Physical Therapy

## 2021-03-26 ENCOUNTER — Encounter: Payer: Self-pay | Admitting: Physical Therapy

## 2021-03-26 ENCOUNTER — Other Ambulatory Visit: Payer: Self-pay

## 2021-03-26 DIAGNOSIS — M542 Cervicalgia: Secondary | ICD-10-CM | POA: Diagnosis not present

## 2021-03-26 DIAGNOSIS — M62838 Other muscle spasm: Secondary | ICD-10-CM | POA: Diagnosis not present

## 2021-03-26 DIAGNOSIS — R293 Abnormal posture: Secondary | ICD-10-CM | POA: Diagnosis not present

## 2021-03-26 DIAGNOSIS — R29898 Other symptoms and signs involving the musculoskeletal system: Secondary | ICD-10-CM

## 2021-03-26 NOTE — Therapy (Signed)
[facility] [address]  [place], [place], [address] Phone: [phone]   Fax:  [phone]  Physical Therapy Treatment  Patient Details  Name: Brian Klein MRN: [id number] Date of Birth: Nov 11, 1945 Referring Provider (PT): [name], MD   Encounter Date: 03/26/2021   PT End of Session - 03/26/21 0805     Visit Number 3    Number of Visits 16    Date for PT Re-Evaluation 04/22/21    Authorization Type [employer] - prior auth required    Authorization Time Period 02/25/21 - 04/22/21    Authorization - Visit Number 2    Authorization - Number of Visits 12    PT Start Time 0805    PT Stop Time 6629    PT Time Calculation (min) 44 min    Activity Tolerance Patient tolerated treatment well    Behavior During Therapy [facility] for tasks assessed/performed             Past Medical History:  Diagnosis Date   Abnormal CT scan 11/26/2015   Atrial fibrillation ([place])    AVD (aortic valve disease)    with bicupsid aortic valve   Chronic anticoagulation 11/26/2015   Erectile dysfunction 01/10/2013   Hypercholesterolemia 12/16/2010   Hyperlipidemia    Hypertension    Mild aortic stenosis    MVP (mitral valve prolapse)    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   Subjective Assessment - 03/26/21 0810     Subjective Pt reports  he tried the new HEP exercises and was surprised how difficult is was to bring his head back to neutral to be able to align his spine along the doorframe but denied any problems with the exercises.    Diagnostic tests 02/03/21 - Neck x-ray: no acute findings.  There is mid cervical degenerative changes.  There is slight loss of the cervical lordosis on the lateral view.    Patient Stated Goals "to have less pain"    Currently in Pain? No/denies                               [facility] Adult PT Treatment/Exercise - 03/26/21 0805       Neck Exercises: Machines for Strengthening   UBE (Upper Arm Bike) L2.0 x 6 min (3' fwd/3' back)      Neck Exercises: Theraband   Shoulder Extension 10 reps;Red    Shoulder Extension Limitations standing with band anchored in door - cues for scap retraction & depression, avoiding shoulder shrug    Rows 10 reps;Red    Rows Limitations standing with band anchored in door - cues for scap retraction & depression, avoiding shoulder shrug    Shoulder External Rotation 10 reps;Red  Shoulder External Rotation Limitations standing with back to doorframe as postural cue/reminder for cervical and scapular retraction    Horizontal ABduction 10 reps;Red    Horizontal ABduction Limitations standing with back to doorframe as postural cue/reminder for cervical and scapular retraction    Other Theraband Exercises Red TB alt UE diagonals + scap retraction 2 x 10 reps - standing with back to doorframe as postural cue/reminder for cervical and scapular retraction      Neck Exercises: Prone   Neck Retraction 10 reps;5 secs    Neck Retraction Limitations POE    Other Prone Exercise POE neck retraction + rotation x 10      Manual Therapy   Manual Therapy Soft tissue mobilization;Myofascial release;Passive ROM;Manual Traction    Soft tissue mobilization STM/DTM to B cervical paraspinals, scalenes, UT & LS - greatest tension in scalenes & cervical paraspinals     Myofascial Release manual TPR to B scalenes    Passive ROM gentle cervical PROM/stretching in all planes    Manual Traction gentle manual distraction with prolonged holds                       PT Short Term Goals - 03/26/21 0812       PT SHORT TERM GOAL #1   Title Patient will be independent with initial HEP    Status Achieved   03/26/21     PT SHORT TERM GOAL #2   Title Patient will verbalize/demonstrate understanding of neutral spine posture and proper body mechanics to reduce strain on cervical spine    Status On-going    Target Date 04/01/21               PT Long Term Goals - 03/24/21 0816       PT LONG TERM GOAL #1   Title Patient will be independent with ongoing/advanced HEP for self-management at home    Status On-going    Target Date 04/22/21      PT LONG TERM GOAL #2   Title Improve posture and alignment with patient to demonstrate improved upright posture with posterior shoulder girdle engaged    Status On-going    Target Date 04/22/21      PT LONG TERM GOAL #3   Title Patient to improve cervical AROM to [facility] without pain provocation    Status On-going    Target Date 04/22/21      PT LONG TERM GOAL #4   Title Patient to report ability to perform ADLs including shaving, household, and leisure activities including reading without limitation due to neck pain, LOM or weakness    Status On-going    Target Date 04/22/21                   Plan - 03/26/21 0824     Clinical Impression Statement Brian Klein reports new HEP update went well but did request review to ensure proper technique - provided minor clarification/confirmation to ensure desired movement and muscle activation while maintaining good posture - STG met. Progressed scapular strengthening while at doorframe adding horiz abd and UE diagonals. Introduced Radio producer with cervical retraction working into rotational ROM with pt denying any increased pain. Manual therapy addressing  tightness/increased muscle tension in cervical musculature (greatest tension in scalenes and cervical paraspinals) as well as gentle traction and PROM - pt noting stretching sensation with movements but denies any pain. He does report some post-exercise muscle soreness following therapy - explained that this is  normal and should resolve w/in a day or so but okay to use ice or heat to promote muscle relaxation and decreased soreness.    Comorbidities A-fib, MVP, h/o LBP, R hip pain, B plantar fasciitis, remote h/o oteogenic sarcoma of jaw    Rehab Potential Good    PT Frequency 2x / week    PT Duration 8 weeks   6-8 weeks   PT Treatment/Interventions ADLs/Self Care Home Management;Cryotherapy;Electrical Stimulation;Iontophoresis 47m/ml Dexamethasone;Moist Heat;Traction;Ultrasound;Functional mobility training;Therapeutic activities;Therapeutic exercise;Neuromuscular re-education;Patient/family education;Manual techniques;Passive range of motion;Dry needling;Taping;Spinal Manipulations    PT Next Visit Plan MD PN for appt 03/31/21; reinforce postural awareness; cervical A/AAROM to tolerance; postural strengthening; manual therapy and modalities PRN    PT Home Exercise Plan Access Code: [email or web address] (8/17, updated 9/13)    Consulted and Agree with Plan of Care Patient             Patient will benefit from skilled therapeutic intervention in order to improve the following deficits and impairments:  Decreased activity tolerance, Decreased mobility, Decreased range of motion, Hypomobility, Increased fascial restricitons, Increased muscle spasms, Impaired perceived functional ability, Impaired flexibility, Improper body mechanics, Postural dysfunction, Pain  Visit Diagnosis: Cervicalgia  Abnormal posture  Other symptoms and signs involving the musculoskeletal system  Other muscle spasm     Problem List Patient Active Problem List   Diagnosis Date Noted   Plantar fasciitis, right 09/12/2017    Pain of left heel 04/13/2017   Plantar fasciitis of left foot 04/13/2017   Acute right-sided low back pain without sciatica 04/13/2017   Pain in right hip 04/13/2017   Abnormal CT scan 11/26/2015   Chronic anticoagulation 11/26/2015   PAF (paroxysmal atrial fibrillation) ([facility]) 11/26/2015   Seasonal allergies 11/19/2014   Erectile dysfunction 01/10/2013   [place]17[place]   Bicuspid aortic valve 12/16/2010   Hypercholesterolemia 12/16/2010    JPercival Spanish PT 03/26/2021, 8:57 AM  [facility] [place] [address]Phone: [phone]  Fax:  [phone] Name: Brian Klein MRN: [id number] of Birth: 123-Nov-1947

## 2021-03-30 ENCOUNTER — Ambulatory Visit: Payer: Medicare PPO | Admitting: Physical Therapy

## 2021-03-31 ENCOUNTER — Encounter: Payer: Self-pay | Admitting: Orthopaedic Surgery

## 2021-03-31 ENCOUNTER — Other Ambulatory Visit: Payer: Self-pay

## 2021-03-31 ENCOUNTER — Encounter: Payer: Medicare PPO | Admitting: Physical Therapy

## 2021-03-31 ENCOUNTER — Other Ambulatory Visit (HOSPITAL_COMMUNITY): Payer: Self-pay | Admitting: Cardiology

## 2021-03-31 ENCOUNTER — Ambulatory Visit: Payer: Medicare PPO | Admitting: Orthopaedic Surgery

## 2021-03-31 DIAGNOSIS — M542 Cervicalgia: Secondary | ICD-10-CM | POA: Diagnosis not present

## 2021-03-31 DIAGNOSIS — I6523 Occlusion and stenosis of bilateral carotid arteries: Secondary | ICD-10-CM

## 2021-03-31 NOTE — Progress Notes (Signed)
HPI: [name] returns today for follow-up of his neck pain.  He is feels that he is 50% better after going to physical therapy.  He does not feel the medication has made a use difference but does feel that therapy is making a huge difference.  He is gone to 3 visits thus far.  He still having no radicular symptoms or waking pain. Ranks pain to be 5 out of 10 pain at worst.  Physical exam: General well-developed well-nourished male no acute distress mood affect appropriate Cervical spine he has full flexion full extension discomfort with flexion noted.  He has good rotation left and right but with rotation of the right he has some discomfort in the right trapezius region.  Impression: Cervicalgia  Plan: He will continue physical therapy work on range of motion strengthening the neck.  Again a home exercise program continue doing this once therapy has released him.  He will follow-up with [place] as needed.  If things become worse or he develops any radicular symptoms he will return.  Questions encouraged and answered by Dr.blackman and myself.

## 2021-04-02 ENCOUNTER — Other Ambulatory Visit: Payer: Self-pay

## 2021-04-02 ENCOUNTER — Ambulatory Visit: Payer: Medicare PPO

## 2021-04-02 DIAGNOSIS — R29898 Other symptoms and signs involving the musculoskeletal system: Secondary | ICD-10-CM

## 2021-04-02 DIAGNOSIS — M542 Cervicalgia: Secondary | ICD-10-CM | POA: Diagnosis not present

## 2021-04-02 DIAGNOSIS — M62838 Other muscle spasm: Secondary | ICD-10-CM | POA: Diagnosis not present

## 2021-04-02 DIAGNOSIS — R293 Abnormal posture: Secondary | ICD-10-CM

## 2021-04-02 NOTE — Therapy (Signed)
[facility] [address]  [place], [place], [address] Phone: [phone]   Fax:  [phone]  Physical Therapy Treatment  Patient Details  Name: Brian Klein MRN: [id number] Date of Birth: 20-Jun-1946 Referring Provider (PT): [name], MD   Encounter Date: 04/02/2021   PT End of Session - 04/02/21 0929     Visit Number 4    Number of Visits 16    Date for PT Re-Evaluation 04/22/21    Authorization Type [employer] - prior auth required    Authorization Time Period 02/25/21 - 04/22/21    Authorization - Visit Number 3    Authorization - Number of Visits 12    PT Start Time 1497    PT Stop Time 0928    PT Time Calculation (min) 45 min    Activity Tolerance Patient tolerated treatment well    Behavior During Therapy [facility] for tasks assessed/performed             Past Medical History:  Diagnosis Date   Abnormal CT scan 11/26/2015   Atrial fibrillation ([place])    AVD (aortic valve disease)    with bicupsid aortic valve   Chronic anticoagulation 11/26/2015   Erectile dysfunction 01/10/2013   Hypercholesterolemia 12/16/2010   Hyperlipidemia    Hypertension    Mild aortic stenosis    MVP (mitral valve prolapse)    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   Subjective Assessment - 04/02/21 0844     Subjective Pt reports  that therapy has been giving him more ROM in his neck so far but still having more pain rotating to the R.    Diagnostic tests 02/03/21 - Neck x-ray: no acute findings.  There is mid cervical degenerative changes.  There is slight loss of the cervical lordosis on the lateral view.    Patient Stated Goals "to have less pain"    Currently in Pain? Yes    Pain Score 4     Pain Location Neck    Pain Orientation Right    Pain Descriptors / Indicators Sharp    Pain Type Acute pain                               [facility] Adult PT Treatment/Exercise - 04/02/21 0001       Exercises   Exercises Neck      Neck Exercises: Machines for Strengthening   UBE (Upper Arm Bike) L2.0 x 6 min (3' fwd/3' back)      Neck Exercises: Theraband   Rows Red;15 reps    Rows Limitations tactile cues for scap retraction      Neck Exercises: Standing   Other Standing Exercises B open books standing at wall 10x2"  Neck Exercises: Stretches   Upper Trapezius Stretch Right;2 reps;30 seconds    Levator Stretch Right;2 reps;30 seconds    Corner Stretch 2 reps;30 seconds      Manual Therapy   Manual Therapy Soft tissue mobilization;Myofascial release    Soft tissue mobilization STM to R scalenes, SCM, UT/LS    Myofascial Release TPR to R scalenes, UT, LS; pin and stretch                     PT Education - 04/02/21 0923     Education Details Update to current HEP    Person(s) Educated Patient    Methods Explanation;Demonstration;Handout    Comprehension Verbalized understanding;Returned demonstration              PT Short Term Goals - 04/02/21 1044       PT SHORT TERM GOAL #1   Title Patient will be independent with initial HEP    Status Achieved   03/26/21     PT SHORT TERM GOAL #2   Title Patient will verbalize/demonstrate understanding of neutral spine posture and proper body mechanics to reduce strain on cervical spine    Status On-going   continue to emphasize  nuetral spine posture with pt   Target Date 04/01/21               PT Long Term Goals - 03/24/21 0816       PT LONG TERM GOAL #1   Title Patient will be independent with ongoing/advanced HEP for self-management at home    Status On-going    Target Date 04/22/21      PT LONG TERM GOAL #2   Title Improve posture and alignment with patient to demonstrate improved upright posture with posterior shoulder girdle engaged    Status On-going    Target Date 04/22/21      PT LONG TERM GOAL #3   Title Patient to improve cervical AROM to [facility] without pain provocation    Status On-going    Target Date 04/22/21      PT LONG TERM GOAL #4   Title Patient to report ability to perform ADLs including shaving, household, and leisure activities including reading without limitation due to neck pain, LOM or weakness    Status On-going    Target Date 04/22/21                   Plan - 04/02/21 0930     Clinical Impression Statement Pt noted improvements in cervical ROM but continued pain when rotating to the R. Pt demonstrated lots of muscle tension in his R scalenes and UT. Also noticed more lateral head tilt to the R which can be contributing to R sided tightness. Improvements made in ability to rotate his head to the R post STM. Added pec stretching today to address rounded shoulder posture. Continued working on overall thoracic mobility and scap stab to work on improving posture. Pt responded well to treatment, would benefit from more work on postural strengthening and thoracic mobility.    Personal Factors and Comorbidities Time since onset of injury/illness/exacerbation;Past/Current Experience;Comorbidity 3+    Comorbidities A-fib, MVP, h/o LBP, R hip pain, B plantar fasciitis, remote h/o oteogenic sarcoma of jaw    PT Frequency 2x / week    PT Duration 8 weeks    PT Treatment/Interventions ADLs/Self Care Home Management;Cryotherapy;Electrical Stimulation;Iontophoresis 4mg /ml  Dexamethasone;Moist Heat;Traction;Ultrasound;Functional mobility training;Therapeutic activities;Therapeutic exercise;Neuromuscular re-education;Patient/family education;Manual techniques;Passive range of motion;Dry needling;Taping;Spinal Manipulations  PT Next Visit Plan reinforce postural awareness; cervical A/AAROM to tolerance; postural strengthening; manual therapy and modalities PRN    PT Home Exercise Plan Access Code: [email or web address] (8/17, updated 9/13)    Consulted and Agree with Plan of Care Patient             Patient will benefit from skilled therapeutic intervention in order to improve the following deficits and impairments:  Decreased activity tolerance, Decreased mobility, Decreased range of motion, Hypomobility, Increased fascial restricitons, Increased muscle spasms, Impaired perceived functional ability, Impaired flexibility, Improper body mechanics, Postural dysfunction, Pain  Visit Diagnosis: Cervicalgia  Abnormal posture  Other symptoms and signs involving the musculoskeletal system  Other muscle spasm     Problem List Patient Active Problem List   Diagnosis Date Noted   Plantar fasciitis, right 09/12/2017   Pain of left heel 04/13/2017   Plantar fasciitis of left foot 04/13/2017   Acute right-sided low back pain without sciatica 04/13/2017   Pain in right hip 04/13/2017   Abnormal CT scan 11/26/2015   Chronic anticoagulation 11/26/2015   PAF (paroxysmal atrial fibrillation) ([facility]) 11/26/2015   Seasonal allergies 11/19/2014   Erectile dysfunction 01/10/2013   Atrial fibrillation ([place]) 12/17/2011   Bicuspid aortic valve 12/16/2010   Hypercholesterolemia 12/16/2010    Artist Pais, PTA 04/02/2021, 10:46 AM  [facility] [address]  [place], [place], [address] Phone: [phone]   Fax:  [phone]  Name: Brian Klein MRN: [id number] Date of Birth: 1946-05-20

## 2021-04-03 NOTE — Progress Notes (Signed)
Cardiology Office Note:    Date:  04/07/2021   ID:  Brian Klein, DOB 12/26/1945, MRN [id number]  PCP:  Brian Infante, MD   [place]  Cardiologist:  Brian Furbish, MD  Advanced Practice Provider:  No care team member to display Electrophysiologist:  None       Referring MD: Brian Infante, MD     History of Present Illness:    Brian Klein is a 75 y.o. male here for follow-up of paroxysmal atrial fibrillation, hypertension, and aortic stenosis. He also been seen by Dr. Lovena Klein as well. Over patient of Dr. Sherryl Klein. He has had prior hernia surgery.  Today, he is doing fine. He reports he has not had any issues with his atrial fibrillation. He also reports he experiences shortness of breath when he carries something heavy up the stairs.   He currently takes Metoprolol Succinate 25 mg, Olmesartan 20 mg and Xarelto 20 mg with no complications.   Had vascular carotid Dopplers done and they did pick up on a left-sided incidental thyroid mass.  I have sent the records over to his primary care physician Dr. Joylene Klein for further evaluation.   He denies any chest pains,chest tightness, chest pressure, Klein edema, chest tightness, fatigue, lightheadedness, dizziness or syncope.   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   PAF (paroxysmal atrial fibrillation) ([place]) 11/26/2015   Seasonal allergies 11/19/2014    Past Surgical History:  Procedure Laterality Date   EPIGASTRIC HERNIA REPAIR N/A 05/19/2020   Procedure: OPEN EPIGASTRIC HERNIA REPAIR WITH MESH;  Surgeon: Klein, Brian Bruce, MD;  Location: [facility];   Service: General;  Laterality: N/A;   EYELID LACERATION REPAIR  2008   left and right   [place]   right removal -- cancer   MANDIBLE SURGERY  1987   right reconstruction   TRANSTHORACIC ECHOCARDIOGRAM  07/18/2002   ef 71%    Current Medications: Current Meds  Medication Sig   amLODipine (NORVASC) 5 MG tablet Take 5 mg by mouth daily.    amoxicillin (AMOXIL) 500 MG capsule TAKE 4 CAPSULES BY MOUTH 1 HOUR BEFORE DENTAL APPOINTMENT   Cholecalciferol (VITAMIN D) 2000 UNITS tablet Take 2,000 Units by mouth daily.   Coenzyme Q10 (CO Q 10 PO) Take 1 capsule by mouth daily.    ezetimibe-simvastatin (VYTORIN) 10-40 MG per tablet Take 1 tablet by mouth at bedtime.   fish oil-omega-3 fatty acids 1000 MG capsule Take 1 g by mouth daily.   fluticasone (FLONASE) 50 MCG/ACT nasal spray Place 1 spray into both nostrils daily as needed for allergies or rhinitis.    metoprolol succinate (TOPROL-XL) 25 MG 24 hr tablet Take 0.5 tablets (12.5 mg total) by mouth daily.   olmesartan (BENICAR) 20 MG tablet Take 20 mg by mouth daily.   XARELTO 20 MG TABS tablet TAKE 1 TABLET(20 MG) BY MOUTH DAILY     Allergies:   Patient has no known allergies.   Social History   Socioeconomic History   Marital status: Married    Spouse name: Brian Klein  Number of children: 2   Years of education: Not on file   Highest education level: Not on file  Occupational History   Occupation: retired  Tobacco Use   Smoking status: Never   Smokeless tobacco: Never  Vaping Use   Vaping Use: Never used  Substance and Sexual Activity   Alcohol use: No   Drug use: No   Sexual activity: Not on file  Other Topics Concern   Not on file  Social History Narrative   Not on file   Social Determinants of Health   Financial Resource Strain: Not on file  Food Insecurity: Not on file  Transportation Needs: Not on file  Physical Activity: Not on file  Stress: Not on file  Social Connections: Not on file     Family  History: The patient's family history includes Heart attack in his father; Hypertension in his father and sister; Stroke in his mother. There is no history of Colon cancer, Stomach cancer, Rectal cancer, or Esophageal cancer.  ROS:   Please see the history of present illness.  (+) shortness of breath   All other systems reviewed and are negative.  EKGs/Labs/Other Studies Reviewed:    The following studies were reviewed today:  Carotid duplex 09/22:  Summary:  Right Carotid: Velocities in the right ICA are consistent with a 1-39%  stenosis.   Left Carotid: Velocities in the left ICA are consistent with a 1-39%  stenosis.   Vertebrals:  Bilateral vertebral arteries demonstrate antegrade flow.  Subclavians: Normal flow hemodynamics were seen in bilateral subclavian arteries.     Incidental finding: complex, heterogenous mass located within the left thyroid, measuring 2.9 x 2.8 cm trv and 4.6 x 2.6 cm sag.   Suggest formal thyroid ultrasound for further evaluation if clinically indicated.   Echocardiogram 04/2020 -EF 60 to 65% severe calcification of the aortic valve with moderate stenosis with mean gradient of 18 mmHg.  ECHO 04/21:  IMPRESSIONS   1. Left ventricular ejection fraction, by estimation, is 55 to 60%. The  left ventricle has normal function. The left ventricle has no regional  wall motion abnormalities. There is moderate left ventricular hypertrophy.  Left ventricular diastolic  parameters are indeterminate.   2. Right ventricular systolic function is normal. The right ventricular  size is normal. There is mildly elevated pulmonary artery systolic  pressure.   3. Left atrial size was mildly dilated.   4. Right atrial size was mildly dilated.   5. The mitral valve is normal in structure. Mild mitral valve  regurgitation. No evidence of mitral stenosis.   6. Tricuspid valve regurgitation is moderate.   7. The aortic valve is tricuspid. Aortic valve regurgitation is  mild.  Moderate aortic valve stenosis.   8. The inferior vena cava is normal in size with greater than 50%  respiratory variability, suggesting right atrial pressure of 3 mmHg.   Carotid Dopplers 2021 -Mild disease bilaterally.  ECHO 03/20:  IMPRESSIONS   1. The left ventricle has normal systolic function with an ejection  fraction of 60-65%. The cavity size was mildly dilated. Left ventricular  diastolic parameters were normal.   2. The right ventricle has normal systolic function. The cavity was  normal. There is no increase in right ventricular wall thickness.   3. Left atrial size was mildly dilated.   4. Right atrial size was mildly dilated.   5. The mitral valve is degenerative. Moderate thickening of the mitral  valve leaflet. Moderate calcification of the mitral  valve leaflet. There  is moderate mitral annular calcification present.   6. Tricuspid valve regurgitation is moderate.   7. The aortic valve has an indeterminate number of cusps Severely  thickening of the aortic valve Severe calcifcation of the aortic valve.  Aortic valve regurgitation is mild by color flow Doppler. moderate  stenosis of the aortic valve.   8. Functionally bicuspid with fused right and left cusps.   9. There is mild dilatation of the aortic root measuring 38 mm.   EKG:  09/22: AFIB, HR 59 bpm  Recent Labs: 04/22/2020: ALT 18 05/08/2020: BUN 15; Creatinine, Ser 1.14; Hemoglobin 13.1; Platelets 135; Potassium 4.4; Sodium 139  Recent Lipid Panel    Component Value Date/Time   CHOL 113 04/22/2020 0900   TRIG 80 04/22/2020 0900   HDL 38 (L) 04/22/2020 0900   CHOLHDL 3.0 04/22/2020 0900   LDLCALC 59 04/22/2020 0900     Risk Assessment/Calculations:      Physical Exam:    VS:  BP 112/70 (BP Location: Left Arm, Patient Position: Sitting, Cuff Size: Normal)   Pulse (!) 59   Ht 5\' 6"  (1.676 m)   Wt 160 lb (72.6 kg)   SpO2 98%   BMI 25.82 kg/m     Wt Readings from Last 3 Encounters:   04/07/21 160 lb (72.6 kg)  09/26/20 162 lb (73.5 kg)  05/19/20 159 lb 2 oz (72.2 kg)     GEN:  Well nourished, well developed in no acute distress HEENT: Normal NECK: No JVD; No carotid bruits LYMPHATICS: No lymphadenopathy CARDIAC: irregular regular, 2/6 murmurs, rubs, gallops RESPIRATORY:  Clear to auscultation without rales, wheezing or rhonchi  ABDOMEN: Soft, non-tender, non-distended MUSCULOSKELETAL:  No edema; No deformity  SKIN: Warm and dry NEUROLOGIC:  Alert and oriented x 3 PSYCHIATRIC:  Normal affect   ASSESSMENT:    1. Thyroid mass   2. Permanent atrial fibrillation ([place])   3. Bicuspid aortic valve   4. Hypercholesterolemia   5. Chronic anticoagulation   6. Carotid artery plaque, bilateral     PLAN:    Thyroid mass Incidental finding seen on carotid Dopplers on 04/06/2021.  Recommendation was to have a dedicated thyroid ultrasound if clinically indicated.  I forwarded results over to Dr. Crist Klein his primary care physician for further evaluation.  Permanent atrial fibrillation (HCC) Rate controlled on low-dose Toprol 12.5 mg once a day.  Continue with current medical management.  Has had been in the mid 28s.  Asymptomatic.  Since he has been asymptomatic, we have not pursued ablative therapies.  Bicuspid aortic valve Noted on echocardiogram.  Moderate stenosis of the aortic valve on echocardiogram in 4/ 2021.  Recheck echocardiogram next year.  Hypercholesterolemia Excellent control with Vytorin 10/40.  He is not having any myalgias.  LDL 73 from outside labs.  ALT 16.  No changes made to medical management.  Chronic anticoagulation Continue with Xarelto 20 mg once a day.  Appropriately dosed.  Last hemoglobin 13.1, creatinine 1.1.  Carotid artery plaque, bilateral  mild carotid plaque bilaterally.  On Vytorin.  Xarelto.   FOLLOW UP IN 1 YEAR  Medication Adjustments/Labs and Tests Ordered: Current medicines are reviewed at length with the patient  today.  Concerns regarding medicines are outlined above.  Orders Placed This Encounter  Procedures   EKG 12-Lead   ECHOCARDIOGRAM COMPLETE    No orders of the defined types were placed in this encounter.   Patient Instructions  Medication Instructions:  The current medical  regimen is effective;  continue present plan and medications.  *If you need a refill on your cardiac medications before your next appointment, please call your pharmacy*  Testing/Procedures: Your physician has requested that you have an echocardiogram in 1 year before seeing Dr Marlou Porch. Echocardiography is a painless test that uses sound waves to create images of your heart. It provides your doctor with information about the size and shape of your heart and how well your heart's chambers and valves are working. This procedure takes approximately one hour. There are no restrictions for this procedure.  Follow-Up: At [facility], you and your health needs are our priority.  As part of our continuing mission to provide you with exceptional heart care, we have created designated Provider Care Teams.  These Care Teams include your primary Cardiologist (physician) and Advanced Practice Providers (APPs -  Physician Assistants and Nurse Practitioners) who all work together to provide you with the care you need, when you need it.  We recommend signing up for the patient portal called "MyChart".  Sign up information is provided on this After Visit Summary.  MyChart is used to connect with patients for Virtual Visits (Telemedicine).  Patients are able to view lab/test results, encounter notes, upcoming appointments, etc.  Non-urgent messages can be sent to your provider as well.   To learn more about what you can do with MyChart, go to [email or web address].    Your next appointment:   1 year(s)  The format for your next appointment:   In Person  Provider:   Candee Furbish, MD   Thank you for choosing [facility]!!     I,Jada Bradford,acting as a scribe for Brian Furbish, MD.,have documented all relevant documentation on the behalf of Brian Furbish, MD,as directed by  Brian Furbish, MD while in the presence of Brian Furbish, MD.  I, Brian Furbish, MD, have reviewed all documentation for this visit. The documentation on 04/07/21 for the exam, diagnosis, procedures, and orders are all accurate and complete.   Signed, Brian Furbish, MD  04/07/2021 9:47 AM    [facility]

## 2021-04-06 ENCOUNTER — Other Ambulatory Visit (HOSPITAL_COMMUNITY): Payer: Self-pay | Admitting: Cardiology

## 2021-04-06 ENCOUNTER — Other Ambulatory Visit: Payer: Self-pay

## 2021-04-06 ENCOUNTER — Ambulatory Visit (HOSPITAL_COMMUNITY)
Admission: RE | Admit: 2021-04-06 | Discharge: 2021-04-06 | Disposition: A | Discharge status: Home / Self Care | Payer: Medicare PPO | Source: Ambulatory Visit | Attending: Oncology | Admitting: Oncology

## 2021-04-06 ENCOUNTER — Ambulatory Visit: Payer: Medicare PPO

## 2021-04-06 DIAGNOSIS — M62838 Other muscle spasm: Secondary | ICD-10-CM

## 2021-04-06 DIAGNOSIS — Z9889 Other specified postprocedural states: Secondary | ICD-10-CM

## 2021-04-06 DIAGNOSIS — R29898 Other symptoms and signs involving the musculoskeletal system: Secondary | ICD-10-CM

## 2021-04-06 DIAGNOSIS — I6522 Occlusion and stenosis of left carotid artery: Secondary | ICD-10-CM

## 2021-04-06 DIAGNOSIS — M542 Cervicalgia: Secondary | ICD-10-CM | POA: Diagnosis not present

## 2021-04-06 DIAGNOSIS — I6523 Occlusion and stenosis of bilateral carotid arteries: Secondary | ICD-10-CM | POA: Insufficient documentation

## 2021-04-06 DIAGNOSIS — R293 Abnormal posture: Secondary | ICD-10-CM | POA: Diagnosis not present

## 2021-04-06 NOTE — Therapy (Signed)
[facility] [address]  [place], [place], [address] Phone: [phone]   Fax:  [phone]  Physical Therapy Treatment  Patient Details  Name: Brian Klein MRN: [id number] Date of Birth: [birth year] Referring Provider (PT): [name], MD   Encounter Date: 04/06/2021   PT End of Session - 04/06/21 0850     Visit Number 5    Number of Visits 16    Date for PT Re-Evaluation 04/22/21    Authorization Type [employer] - prior auth required    Authorization Time Period 02/25/21 - 04/22/21    Authorization - Visit Number 4    Authorization - Number of Visits 12    PT Start Time 0802    PT Stop Time 8638    PT Time Calculation (min) 44 min    Activity Tolerance Patient tolerated treatment well    Behavior During Therapy [facility] for tasks assessed/performed             Past Medical History:  Diagnosis Date   Abnormal CT scan 11/26/2015   Atrial fibrillation ([place])    AVD (aortic valve disease)    with bicupsid aortic valve   Chronic anticoagulation 11/26/2015   Erectile dysfunction 01/10/2013   Hypercholesterolemia 12/16/2010   Hyperlipidemia    Hypertension    Mild aortic stenosis    MVP (mitral valve prolapse)    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   Subjective Assessment - 04/06/21 0804     Subjective Pt notes  some soreness on the R side of his neck today.    Diagnostic tests 02/03/21 - Neck x-ray: no acute findings.  There is mid cervical degenerative changes.  There is slight loss of the cervical lordosis on the lateral view.    Patient Stated Goals "to have less pain"    Currently in Pain? Yes    Pain Score 4     Pain Location Neck    Pain Orientation Right    Pain Descriptors / Indicators Sharp    Pain Type Acute pain                               [facility] Adult PT Treatment/Exercise - 04/06/21 0001       Self-Care   Self-Care Posture    Posture edu on ways to keep posture during the day, the importance of pec stretches and posterior shoulder strenthening      Exercises   Exercises Neck      Neck Exercises: Machines for Strengthening   UBE (Upper Arm Bike) L2.0 x 6 min (3' fwd/3' back)      Neck Exercises: Theraband   Shoulder Extension 10 reps;Green    Shoulder Extension Limitations standing    Rows  20 reps;Green    Rows Limitations standing    Shoulder External Rotation 10 reps;Green    Shoulder External Rotation Limitations standing with back against doorframe      Neck Exercises: Seated   Other Seated Exercise thoracic extensions over chair 20 reps      Neck Exercises: Stretches   Upper Trapezius Stretch Right;Left;3 reps    Upper Trapezius Stretch Limitations 15 seconds each    Levator Stretch Right;Left;3 reps    Levator Stretch Limitations 15 seconds each    Corner Stretch 2 reps;30 seconds    Other Neck Stretches thoracic extension/lat stretch seated with arms propped on table 3x10"      Manual Therapy   Manual Therapy Soft tissue mobilization    Soft tissue mobilization STM to B levator, cervical paraspinals, scalenes                       PT Short Term Goals - 04/06/21 0851       PT SHORT TERM GOAL #1   Title Patient will be independent with initial HEP    Status Achieved   03/26/21     PT SHORT TERM GOAL #2   Title Patient  will verbalize/demonstrate understanding of neutral spine posture and proper body mechanics to reduce strain on cervical spine    Status Achieved    Target Date 04/01/21               PT Long Term Goals - 03/24/21 0816       PT LONG TERM GOAL #1   Title Patient will be independent with ongoing/advanced HEP for self-management at home    Status On-going    Target Date 04/22/21      PT LONG TERM GOAL #2   Title Improve posture and alignment with patient to demonstrate improved upright posture with posterior shoulder girdle engaged    Status On-going    Target Date 04/22/21      PT LONG TERM GOAL #3   Title Patient to improve cervical AROM to [facility] without pain provocation    Status On-going    Target Date 04/22/21      PT LONG TERM GOAL #4   Title Patient to report ability to perform ADLs including shaving, household, and leisure activities including reading without limitation due to neck pain, LOM or weakness    Status On-going    Target Date 04/22/21                   Plan - 04/06/21 0851     Clinical Impression Statement Pt noted some sorness on the R side of his neck coming in today, this was addressed with STM. He shows a better understanding of posture to reduce strain on the cervical spine - STG 2 met. Instructions given with exercises to emphasize periscap muscles and extend the trunk to increase overall mobility. Progressed strengthening HEP with green TB today and briefly reviewed pec stretches. Pt is making good progress so far.    Personal Factors and Comorbidities Time since onset of injury/illness/exacerbation;Past/Current Experience;Comorbidity 3+    Comorbidities A-fib, MVP, h/o LBP, R hip pain, B plantar fasciitis, remote h/o oteogenic sarcoma of jaw    PT Frequency 2x / week    PT Duration 8 weeks    PT Treatment/Interventions ADLs/Self Care Home Management;Cryotherapy;Electrical Stimulation;Iontophoresis 65m/ml Dexamethasone;Moist  Heat;Traction;Ultrasound;Functional mobility training;Therapeutic activities;Therapeutic exercise;Neuromuscular re-education;Patient/family education;Manual techniques;Passive range of motion;Dry needling;Taping;Spinal Manipulations    PT Next Visit  Plan reinforce postural awareness; cervical A/AAROM to tolerance; postural strengthening; manual therapy and modalities PRN    PT Home Exercise Plan Access Code: [email or web address] (8/17, updated 9/13)    Consulted and Agree with Plan of Care Patient             Patient will benefit from skilled therapeutic intervention in order to improve the following deficits and impairments:  Decreased activity tolerance, Decreased mobility, Decreased range of motion, Hypomobility, Increased fascial restricitons, Increased muscle spasms, Impaired perceived functional ability, Impaired flexibility, Improper body mechanics, Postural dysfunction, Pain  Visit Diagnosis: Cervicalgia  Abnormal posture  Other symptoms and signs involving the musculoskeletal system  Other muscle spasm     Problem List Patient Active Problem List   Diagnosis Date Noted   Plantar fasciitis, right 09/12/2017   Pain of left heel 04/13/2017   Plantar fasciitis of left foot 04/13/2017   Acute right-sided low back pain without sciatica 04/13/2017   Pain in right hip 04/13/2017   Abnormal CT scan 11/26/2015   Chronic anticoagulation 11/26/2015   PAF (paroxysmal atrial fibrillation) (HCC) 11/26/2015   Seasonal allergies 11/19/2014   Erectile dysfunction 01/10/2013   Atrial fibrillation ([place]) 12/17/2011   Bicuspid aortic valve 12/16/2010   Hypercholesterolemia 12/16/2010    Artist Pais, PTA 04/06/2021, 8:57 AM  [facility] [address]  [place], [place], [address] Phone: [phone]   Fax:  [phone]  Name: Brian Klein MRN: [id number] Date of Birth: 17-Aug-1945

## 2021-04-07 ENCOUNTER — Ambulatory Visit: Payer: Medicare PPO | Admitting: Cardiology

## 2021-04-07 DIAGNOSIS — Z7901 Long term (current) use of anticoagulants: Secondary | ICD-10-CM | POA: Diagnosis not present

## 2021-04-07 DIAGNOSIS — Q231 Congenital insufficiency of aortic valve: Secondary | ICD-10-CM | POA: Diagnosis not present

## 2021-04-07 DIAGNOSIS — I6523 Occlusion and stenosis of bilateral carotid arteries: Secondary | ICD-10-CM | POA: Insufficient documentation

## 2021-04-07 DIAGNOSIS — I4821 Permanent atrial fibrillation: Secondary | ICD-10-CM

## 2021-04-07 DIAGNOSIS — E7800 Pure hypercholesterolemia, unspecified: Secondary | ICD-10-CM

## 2021-04-07 DIAGNOSIS — E079 Disorder of thyroid, unspecified: Secondary | ICD-10-CM | POA: Insufficient documentation

## 2021-04-07 NOTE — Assessment & Plan Note (Addendum)
Noted on echocardiogram.  Moderate stenosis of the aortic valve on echocardiogram in 4/ 2021.  Recheck echocardiogram next year.

## 2021-04-07 NOTE — Assessment & Plan Note (Signed)
Excellent control with Vytorin 10/40.  He is not having any myalgias.  LDL 73 from outside labs.  ALT 16.  No changes made to medical management.

## 2021-04-07 NOTE — Assessment & Plan Note (Signed)
Rate controlled on low-dose Toprol 12.5 mg once a day.  Continue with current medical management.  Has had been in the mid 71s.  Asymptomatic.  Since he has been asymptomatic, we have not pursued ablative therapies.

## 2021-04-07 NOTE — Assessment & Plan Note (Signed)
mild carotid plaque bilaterally.  On Vytorin.  Xarelto.

## 2021-04-07 NOTE — Assessment & Plan Note (Signed)
Incidental finding seen on carotid Dopplers on 04/06/2021.  Recommendation was to have a dedicated thyroid ultrasound if clinically indicated.  I forwarded results over to Dr. Crist Infante his primary care physician for further evaluation.

## 2021-04-07 NOTE — Assessment & Plan Note (Signed)
Continue with Xarelto 20 mg once a day.  Appropriately dosed.  Last hemoglobin 13.1, creatinine 1.1.

## 2021-04-07 NOTE — Patient Instructions (Signed)
Medication Instructions:  The current medical regimen is effective;  continue present plan and medications.  *If you need a refill on your cardiac medications before your next appointment, please call your pharmacy*  Testing/Procedures: Your physician has requested that you have an echocardiogram in 1 year before seeing Dr Marlou Porch. Echocardiography is a painless test that uses sound waves to create images of your heart. It provides your doctor with information about the size and shape of your heart and how well your heart's chambers and valves are working. This procedure takes approximately one hour. There are no restrictions for this procedure.  Follow-Up: At [facility], you and your health needs are our priority.  As part of our continuing mission to provide you with exceptional heart care, we have created designated Provider Care Teams.  These Care Teams include your primary Cardiologist (physician) and Advanced Practice Providers (APPs -  Physician Assistants and Nurse Practitioners) who all work together to provide you with the care you need, when you need it.  We recommend signing up for the patient portal called "MyChart".  Sign up information is provided on this After Visit Summary.  MyChart is used to connect with patients for Virtual Visits (Telemedicine).  Patients are able to view lab/test results, encounter notes, upcoming appointments, etc.  Non-urgent messages can be sent to your provider as well.   To learn more about what you can do with MyChart, go to [email or web address].    Your next appointment:   1 year(s)  The format for your next appointment:   In Person  Provider:   Candee Furbish, MD   Thank you for choosing [facility]!!

## 2021-04-09 ENCOUNTER — Encounter: Payer: Medicare PPO | Admitting: Physical Therapy

## 2021-04-14 ENCOUNTER — Other Ambulatory Visit: Payer: Self-pay | Admitting: Internal Medicine

## 2021-04-14 ENCOUNTER — Ambulatory Visit: Payer: Medicare PPO | Admitting: Physical Therapy

## 2021-04-14 DIAGNOSIS — E041 Nontoxic single thyroid nodule: Secondary | ICD-10-CM

## 2021-04-16 ENCOUNTER — Ambulatory Visit: Payer: Medicare PPO | Attending: Orthopaedic Surgery | Admitting: Physical Therapy

## 2021-04-16 ENCOUNTER — Encounter: Payer: Self-pay | Admitting: Physical Therapy

## 2021-04-16 ENCOUNTER — Other Ambulatory Visit: Payer: Self-pay

## 2021-04-16 DIAGNOSIS — R293 Abnormal posture: Secondary | ICD-10-CM | POA: Insufficient documentation

## 2021-04-16 DIAGNOSIS — M62838 Other muscle spasm: Secondary | ICD-10-CM | POA: Insufficient documentation

## 2021-04-16 DIAGNOSIS — R29898 Other symptoms and signs involving the musculoskeletal system: Secondary | ICD-10-CM | POA: Insufficient documentation

## 2021-04-16 DIAGNOSIS — M542 Cervicalgia: Secondary | ICD-10-CM | POA: Diagnosis not present

## 2021-04-16 NOTE — Patient Instructions (Signed)
    Access Code: [email or web address] URL: [email or web address] Date: 04/16/2021 Prepared by: Annie Paras  Exercises Seated Cervical Retraction - 2-3 x daily - 7 x weekly - 2 sets - 10 reps - 3-5 sec hold Supine Cervical Retraction with Towel - 2-3 x daily - 7 x weekly - 2 sets - 10 reps - 3-5 sec hold Seated Gentle Upper Trapezius Stretch - 2-3 x daily - 7 x weekly - 3 reps - 30 sec hold Gentle Levator Scapulae Stretch - 2-3 x daily - 7 x weekly - 3 reps - 30 sec hold Mid-Lower Cervical Extension SNAG with Strap - 2-3 x daily - 7 x weekly - 2 sets - 10 reps - 3 sec hold Seated Assisted Cervical Rotation with Towel - 2-3 x daily - 7 x weekly - 2 sets - 10 reps - 3 sec hold Seated Scapular Retraction - 2-3 x daily - 7 x weekly - 2 sets - 10 reps - 5 sec hold Standing Shoulder Row with Anchored Resistance - 1 x daily - 3-4 x weekly - 2 sets - 10 reps - 5 sec hold Scapular Retraction with Resistance Advanced - 1 x daily - 3-4 x weekly - 2 sets - 10 reps - 5 sec hold Shoulder External Rotation and Scapular Retraction with Resistance - 1 x daily - 3-4 x weekly - 2 sets - 10 reps - 3-5 sec hold Corner Pec Major Stretch - 2 x daily - 7 x weekly - 2 sets - 10 reps Cervical Retraction with Resistance - 1 x daily - 3-4 x weekly - 2 sets - 10 reps - 3-5 sec hold Cervical Retraction Prone on Elbows - 1 x daily - 3-4 x weekly - 2 sets - 10 reps - 3 sec hold Cervical Rotation Prone on Elbows - 1 x daily - 3-4 x weekly - 2 sets - 10 reps - 3 sec hold Standing Shoulder Horizontal Abduction with Resistance - 1 x daily - 3-4 x weekly - 2 sets - 10 reps - 3 sec hold Standing Shoulder Diagonal Horizontal Abduction 60/120 Degrees with Resistance - 1 x daily - 3-4 x weekly - 2 sets - 10 reps - 3 sec hold

## 2021-04-16 NOTE — Therapy (Signed)
[facility] [address]  [place], [place], [address] Phone: [phone]   Fax:  [phone]  Physical Therapy Treatment  Patient Details  Name: Brian Klein MRN: [id number] Date of Birth: Jun 16, 1946 Referring Provider (PT): [name], MD   Encounter Date: 04/16/2021   PT End of Session - 04/16/21 0804     Visit Number 6    Number of Visits 16    Date for PT Re-Evaluation 04/22/21    Authorization Type [employer] - prior auth required    Authorization Time Period 02/25/21 - 04/22/21    Authorization - Visit Number 5    Authorization - Number of Visits 12    PT Start Time 0804    PT Stop Time 0845    PT Time Calculation (min) 41 min    Activity Tolerance Patient tolerated treatment well    Behavior During Therapy [facility] for tasks assessed/performed             Past Medical History:  Diagnosis Date   Abnormal CT scan 11/26/2015   Atrial fibrillation ([place])    AVD (aortic valve disease)    with bicupsid aortic valve   Chronic anticoagulation 11/26/2015   Erectile dysfunction 01/10/2013   Hypercholesterolemia 12/16/2010   Hyperlipidemia    Hypertension    Mild aortic stenosis    MVP (mitral valve prolapse)    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   Subjective Assessment - 04/16/21 0808     Subjective Pt reports  pain has been better - "yesterday didn't really even notice it" and only mild this morning.    Diagnostic tests 02/03/21 - Neck x-ray: no acute findings.  There is mid cervical degenerative changes.  There is slight loss of the cervical lordosis on the lateral view.    Patient Stated Goals "to have less pain"    Currently in Pain? Yes    Pain Score 3                                [facility] Adult PT Treatment/Exercise - 04/16/21 0804       Neck Exercises: Machines for Strengthening   UBE (Upper Arm Bike) L2.5 x 6 min (3' fwd/3' back)      Neck Exercises: Theraband   Horizontal ABduction 10 reps;Green    Horizontal ABduction Limitations standing with back to doorframe as postural cue/reminder for cervical and scapular retraction    Other Theraband Exercises Green TB alt UE diagonals + scap retraction 2 x 10 reps - standing with back to doorframe as postural cue/reminder for cervical and scapular retraction      Neck Exercises: Seated   Neck Retraction 10 reps;5 secs    Neck Retraction Limitations red TB  resistance    Cervical Rotation Right;Left;10 reps    Cervical Rotation Limitations rotation SNAGs with rolled pillowcase    Other Seated Exercise Cervical extension SNAGs 10 x 3"      Neck Exercises: Prone   Neck Retraction 10 reps;5 secs    Neck Retraction Limitations POE    Other Prone Exercise POE neck retraction + rotation x 10                     PT Education - 04/16/21 0843     Education Details HEP review & update - Access Code: [email or web address]    Person(s) Educated Patient    Methods Explanation;Demonstration;Verbal cues;Handout    Comprehension Verbalized understanding;Verbal cues required;Returned demonstration              PT Short Term Goals - 04/16/21 0809       PT SHORT TERM GOAL #1   Title Patient will be independent with initial HEP    Status Achieved   03/26/21     PT SHORT TERM GOAL #2   Title Patient will verbalize/demonstrate  understanding of neutral spine posture and proper body mechanics to reduce strain on cervical spine    Status Achieved   04/06/21              PT Long Term Goals - 04/16/21 0810       PT LONG TERM GOAL #1   Title Patient will be independent with ongoing/advanced HEP for self-management at home    Status Partially Met    Target Date 04/22/21      PT LONG TERM GOAL #2   Title Improve posture and alignment with patient to demonstrate improved upright posture with posterior shoulder girdle engaged    Status Achieved   04/16/21     PT LONG TERM GOAL #3   Title Patient to improve cervical AROM to [facility] without pain provocation    Status Partially Met   04/16/21 - pt reports mlid pain & restriction with R rotatiom   Target Date 04/22/21      PT LONG TERM GOAL #4   Title Patient to report ability to perform ADLs including shaving, household, and leisure activities including reading without limitation due to neck pain, LOM or weakness    Status Partially Met   04/16/21 - pt noting improvement with most activities but still notes some limitation with turning head while driving   Target Date 04/22/21                   Plan - 04/16/21 0811     Clinical Impression Statement Zedric report neck pain has been better (less frequent and less intense) with less interference in daily activities although some mild pain and limitation still noted with cervical rotation to R when turning head while driving. Pt feels like he will likely be ready to transition to his HEP at the end of his current POC, therefore session focusing on review and progression/update of HEP as indicated - all exercises well tolerated with updated HEP instructions provided and frequency for maintenance level HEP explained.    Comorbidities A-fib, MVP, h/o LBP, R hip pain, B plantar fasciitis, remote h/o oteogenic sarcoma of jaw    Rehab Potential Good    PT Frequency 2x / week    PT Duration 8 weeks   6-8 weeks   PT  Treatment/Interventions ADLs/Self Care Home Management;Cryotherapy;Electrical Stimulation;Iontophoresis 56m/ml Dexamethasone;Moist Heat;Traction;Ultrasound;Functional mobility training;Therapeutic activities;Therapeutic exercise;Neuromuscular re-education;Patient/family education;Manual techniques;Passive  range of motion;Dry needling;Taping;Spinal Manipulations    PT Next Visit Plan goal assessment; anticipate transition to HEP +/- 30-day hold    PT Home Exercise Plan Access Code: [email or web address] (8/17, updated 9/13, 9/22 & 10/6)    Consulted and Agree with Plan of Care Patient             Patient will benefit from skilled therapeutic intervention in order to improve the following deficits and impairments:  Decreased activity tolerance, Decreased mobility, Decreased range of motion, Hypomobility, Increased fascial restricitons, Increased muscle spasms, Impaired perceived functional ability, Impaired flexibility, Improper body mechanics, Postural dysfunction, Pain  Visit Diagnosis: Cervicalgia  Abnormal posture  Other symptoms and signs involving the musculoskeletal system  Other muscle spasm     Problem List Patient Active Problem List   Diagnosis Date Noted   Thyroid mass 04/07/2021   Carotid artery plaque, bilateral 04/07/2021   Plantar fasciitis, right 09/12/2017   Pain of left heel 04/13/2017   Plantar fasciitis of left foot 04/13/2017   Acute right-sided low back pain without sciatica 04/13/2017   Pain in right hip 04/13/2017   Abnormal CT scan 11/26/2015   Chronic anticoagulation 11/26/2015   Seasonal allergies 11/19/2014   Erectile dysfunction 01/10/2013   Permanent atrial fibrillation ([place]) 12/17/2011   Bicuspid aortic valve 12/16/2010   Hypercholesterolemia 12/16/2010    Percival Spanish, PT 04/16/2021, 10:48 AM  [facility] [address]  [place], [place], [address] Phone: [phone]   Fax:   [phone]  Name: Brian Klein MRN: [id number] Date of Birth: [birth year]

## 2021-04-17 ENCOUNTER — Ambulatory Visit
Admission: RE | Admit: 2021-04-17 | Discharge: 2021-04-17 | Disposition: A | Discharge status: Home / Self Care | Payer: Medicare PPO | Source: Ambulatory Visit | Attending: Internal Medicine | Admitting: Internal Medicine

## 2021-04-17 DIAGNOSIS — E041 Nontoxic single thyroid nodule: Secondary | ICD-10-CM | POA: Diagnosis not present

## 2021-04-21 ENCOUNTER — Other Ambulatory Visit: Payer: Self-pay

## 2021-04-21 ENCOUNTER — Ambulatory Visit: Payer: Medicare PPO

## 2021-04-21 DIAGNOSIS — M542 Cervicalgia: Secondary | ICD-10-CM | POA: Diagnosis not present

## 2021-04-21 DIAGNOSIS — M62838 Other muscle spasm: Secondary | ICD-10-CM

## 2021-04-21 DIAGNOSIS — R293 Abnormal posture: Secondary | ICD-10-CM

## 2021-04-21 DIAGNOSIS — R29898 Other symptoms and signs involving the musculoskeletal system: Secondary | ICD-10-CM | POA: Diagnosis not present

## 2021-04-21 NOTE — Therapy (Addendum)
[facility] [address]  [place], [place], [address] Phone: [phone]   Fax:  [phone]  Physical Therapy Treatment / Discharge Summary  Patient Details  Name: Brian Klein MRN: [id number] Date of Birth: 1946-05-23 Referring Provider (PT): [name], MD   Encounter Date: 04/21/2021   PT End of Session - 04/21/21 0841     Visit Number 7    Number of Visits 16    Date for PT Re-Evaluation 04/22/21    Authorization Type [employer] - prior auth required    Authorization Time Period 02/25/21 - 04/22/21    Authorization - Visit Number 6    Authorization - Number of Visits 12    PT Start Time 0802    PT Stop Time 5329    PT Time Calculation (min) 36 min    Activity Tolerance Patient tolerated treatment well    Behavior During Therapy [facility] for tasks assessed/performed             Past Medical History:  Diagnosis Date   Abnormal CT scan 11/26/2015   Atrial fibrillation ([place])    AVD (aortic valve disease)    with bicupsid aortic valve   Chronic anticoagulation 11/26/2015   Erectile dysfunction 01/10/2013   Hypercholesterolemia 12/16/2010   Hyperlipidemia    Hypertension    Mild aortic stenosis    MVP (mitral valve prolapse)    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   Subjective Assessment - 04/21/21 0806      Subjective Exercises always help my pain but still some R sided neck pain.    Diagnostic tests 02/03/21 - Neck x-ray: no acute findings.  There is mid cervical degenerative changes.  There is slight loss of the cervical lordosis on the lateral view.    Patient Stated Goals "to have less pain"    Currently in Pain? Yes    Pain Score 2     Pain Location Neck    Pain Orientation Right    Pain Descriptors / Indicators Sharp    Pain Type Acute pain                [facility] PT Assessment - 04/21/21 0001       Observation/Other Assessments   Focus on Therapeutic Outcomes (FOTO)  Neck = 67; predicted D/C FS = 71                           [facility] Adult PT Treatment/Exercise - 04/21/21 0001       Neck Exercises: Machines for Strengthening   UBE (Upper Arm Bike) L2.5 x 6 min (3' fwd/3' back)      Neck Exercises: Theraband   Shoulder Extension Limitations reviewed with TB    Rows Limitations reviewed with  TB    Horizontal ABduction Limitations reviewed    Other Theraband Exercises alt UE diagonals with red TB (reviewed)                       PT Short Term Goals - 04/16/21 0809       PT SHORT TERM GOAL #1   Title Patient will be independent with initial HEP    Status Achieved   03/26/21     PT SHORT TERM GOAL #2   Title Patient will verbalize/demonstrate understanding of neutral spine posture and proper body mechanics to reduce strain on cervical spine    Status Achieved   04/06/21              PT Long Term Goals - 04/21/21 0811       PT LONG TERM GOAL #1   Title Patient will be independent with ongoing/advanced HEP for self-management at home    Status Acheived      PT LONG TERM GOAL #2   Title Improve posture and alignment with patient to demonstrate improved upright posture with posterior shoulder girdle engaged    Status Achieved   04/16/21     PT LONG TERM GOAL #3   Title Patient to improve cervical AROM to [facility] without pain provocation     Status Partially Met   04/16/21 - pt reports mlid pain & restriction with R rotatiom     PT LONG TERM GOAL #4   Title Patient to report ability to perform ADLs including shaving, household, and leisure activities including reading without limitation due to neck pain, LOM or weakness    Status Partially Met   04/21/21 - pt noting improvement with most activities but still notes some limitation with turning head while driving and shaving                  Plan - 04/21/21 0842     Clinical Impression Statement Pt has made improvements with his everyday activities but still noting some difficulty with shaving and turning his head while driving. Cervical ROM taken last visit so deferred measurements today. FOTO score shows some improvements since the last time taken. Reviewed HEP exercises per pt request that were most challenging for him, provided instructions on proper form for exercises, and gave instructions on giving rest days between exercises. He will be placed on a 30 day hold after todays visit due to his improvements.    Personal Factors and Comorbidities Time since onset of injury/illness/exacerbation;Past/Current Experience;Comorbidity 3+    Comorbidities A-fib, MVP, h/o LBP, R hip pain, B plantar fasciitis, remote h/o oteogenic sarcoma of jaw    Rehab Potential Good    PT Frequency 2x / week    PT Duration 8 weeks   6-8 weeks   PT Treatment/Interventions ADLs/Self Care Home Management;Cryotherapy;Electrical Stimulation;Iontophoresis 58m/ml Dexamethasone;Moist Heat;Traction;Ultrasound;Functional mobility training;Therapeutic activities;Therapeutic exercise;Neuromuscular re-education;Patient/family education;Manual techniques;Passive range of motion;Dry needling;Taping;Spinal Manipulations    PT Next Visit Plan transition to HEP + 30-day hold    PT Home Exercise Plan Access Code: [email or web address](8/17, updated 9/13, 9/22 & 10/6)    Consulted and Agree with Plan of Care Patient              Patient will benefit from skilled therapeutic intervention in order to improve the following deficits and impairments:  Decreased activity tolerance, Decreased mobility, Decreased range of motion, Hypomobility, Increased fascial restricitons, Increased muscle spasms, Impaired perceived functional ability, Impaired flexibility, Improper body mechanics,  Postural dysfunction, Pain  Visit Diagnosis: Cervicalgia  Abnormal posture  Other symptoms and signs involving the musculoskeletal system  Other muscle spasm     Problem List Patient Active Problem List   Diagnosis Date Noted   Thyroid mass 04/07/2021   Carotid artery plaque, bilateral 04/07/2021   Plantar fasciitis, right 09/12/2017   Pain of left heel 04/13/2017   Plantar fasciitis of left foot 04/13/2017   Acute right-sided low back pain without sciatica 04/13/2017   Pain in right hip 04/13/2017   Abnormal CT scan 11/26/2015   Chronic anticoagulation 11/26/2015   Seasonal allergies 11/19/2014   Erectile dysfunction 01/10/2013   Permanent atrial fibrillation ([place]) 12/17/2011   Bicuspid aortic valve 12/16/2010   Hypercholesterolemia 12/16/2010    Artist Pais, PTA 04/21/2021, 8:53 AM  [facility] [address]  [place], [place], [address] Phone: [phone]   Fax:  [phone]  Name: RHONE OZAKI III MRN: [id number] Date of Birth: 07/14/1945   PHYSICAL THERAPY DISCHARGE SUMMARY  Visits from Start of Care: 7  Current functional level related to goals / functional outcomes:   Refer to above clinical impression for status as of last visit on 04/21/2021. Patient was placed on hold for 30 days and has not needed to return to PT, therefore will proceed with discharge from PT for this episode.   Remaining deficits:   As above.   Education / Equipment:   HEP   Patient agrees to discharge. Patient goals were partially met. Patient is being  discharged due to being pleased with the current functional level.  Percival Spanish, PT, MPT 06/01/21, 1:46 PM  [facility] [address]  [place], [place], [address] Phone: [phone]   Fax:  [phone]

## 2021-04-23 ENCOUNTER — Other Ambulatory Visit: Payer: Self-pay | Admitting: Internal Medicine

## 2021-04-23 ENCOUNTER — Encounter: Payer: Medicare PPO | Admitting: Physical Therapy

## 2021-04-23 DIAGNOSIS — E041 Nontoxic single thyroid nodule: Secondary | ICD-10-CM

## 2021-04-27 ENCOUNTER — Other Ambulatory Visit: Payer: Self-pay | Admitting: Internal Medicine

## 2021-04-27 DIAGNOSIS — E041 Nontoxic single thyroid nodule: Secondary | ICD-10-CM

## 2021-05-20 ENCOUNTER — Ambulatory Visit
Admission: RE | Admit: 2021-05-20 | Discharge: 2021-05-20 | Disposition: A | Discharge status: Home / Self Care | Payer: Medicare PPO | Source: Ambulatory Visit | Attending: Internal Medicine | Admitting: Internal Medicine

## 2021-05-20 ENCOUNTER — Other Ambulatory Visit (HOSPITAL_COMMUNITY)
Admission: RE | Admit: 2021-05-20 | Discharge: 2021-05-20 | Disposition: A | Discharge status: Home / Self Care | Payer: Medicare PPO | Source: Ambulatory Visit | Attending: Internal Medicine | Admitting: Internal Medicine

## 2021-05-20 DIAGNOSIS — E042 Nontoxic multinodular goiter: Secondary | ICD-10-CM | POA: Diagnosis not present

## 2021-05-20 DIAGNOSIS — E041 Nontoxic single thyroid nodule: Secondary | ICD-10-CM | POA: Insufficient documentation

## 2021-05-21 LAB — CYTOLOGY - NON PAP

## 2021-05-27 DIAGNOSIS — H35363 Drusen (degenerative) of macula, bilateral: Secondary | ICD-10-CM | POA: Diagnosis not present

## 2021-05-27 DIAGNOSIS — H353131 Nonexudative age-related macular degeneration, bilateral, early dry stage: Secondary | ICD-10-CM | POA: Diagnosis not present

## 2021-06-01 DIAGNOSIS — L570 Actinic keratosis: Secondary | ICD-10-CM | POA: Diagnosis not present

## 2021-06-01 DIAGNOSIS — Z85828 Personal history of other malignant neoplasm of skin: Secondary | ICD-10-CM | POA: Diagnosis not present

## 2021-06-01 DIAGNOSIS — L578 Other skin changes due to chronic exposure to nonionizing radiation: Secondary | ICD-10-CM | POA: Diagnosis not present

## 2021-09-04 ENCOUNTER — Other Ambulatory Visit: Payer: Self-pay | Admitting: *Deleted

## 2021-09-04 MED ORDER — AMOXICILLIN 500 MG PO CAPS: ORAL_CAPSULE | ORAL | 3 refills | Status: DC

## 2021-09-14 ENCOUNTER — Other Ambulatory Visit: Payer: Self-pay | Admitting: Cardiology

## 2021-09-15 NOTE — Telephone Encounter (Signed)
Pt last saw Dr Marlou Porch 04/07/21, last labs 05/08/20 Creat 1.14, pt is overdue for labwork.  Called pt's PCP LM on medical records VM to fax results.  Age 76, weight 72.6kg, will await most recent labwork to calculate CrCl and check if pt is on appropriate dosage of Xarelto.  ?

## 2021-09-15 NOTE — Telephone Encounter (Signed)
Last labs 12/10/20 Creat 1.2, age 76, weight 72.6kg, CrCl 52.1, based on CrCl pt is on appropriate dosage of Xarelto '20mg'$  QD.  Will refill rx.  ?

## 2021-11-25 DIAGNOSIS — H35033 Hypertensive retinopathy, bilateral: Secondary | ICD-10-CM | POA: Diagnosis not present

## 2021-11-25 DIAGNOSIS — H35363 Drusen (degenerative) of macula, bilateral: Secondary | ICD-10-CM | POA: Diagnosis not present

## 2021-11-25 DIAGNOSIS — H40003 Preglaucoma, unspecified, bilateral: Secondary | ICD-10-CM | POA: Diagnosis not present

## 2021-11-25 DIAGNOSIS — H5213 Myopia, bilateral: Secondary | ICD-10-CM | POA: Diagnosis not present

## 2021-11-25 DIAGNOSIS — H2513 Age-related nuclear cataract, bilateral: Secondary | ICD-10-CM | POA: Diagnosis not present

## 2021-11-25 DIAGNOSIS — H353131 Nonexudative age-related macular degeneration, bilateral, early dry stage: Secondary | ICD-10-CM | POA: Diagnosis not present

## 2021-11-25 DIAGNOSIS — H43813 Vitreous degeneration, bilateral: Secondary | ICD-10-CM | POA: Diagnosis not present

## 2021-11-25 DIAGNOSIS — H02834 Dermatochalasis of left upper eyelid: Secondary | ICD-10-CM | POA: Diagnosis not present

## 2021-11-25 DIAGNOSIS — H02831 Dermatochalasis of right upper eyelid: Secondary | ICD-10-CM | POA: Diagnosis not present

## 2021-11-30 ENCOUNTER — Encounter: Payer: Self-pay | Admitting: Cardiology

## 2021-11-30 DIAGNOSIS — Z85828 Personal history of other malignant neoplasm of skin: Secondary | ICD-10-CM | POA: Diagnosis not present

## 2021-11-30 DIAGNOSIS — D485 Neoplasm of uncertain behavior of skin: Secondary | ICD-10-CM | POA: Diagnosis not present

## 2021-11-30 DIAGNOSIS — L570 Actinic keratosis: Secondary | ICD-10-CM | POA: Diagnosis not present

## 2022-02-04 DIAGNOSIS — Z125 Encounter for screening for malignant neoplasm of prostate: Secondary | ICD-10-CM | POA: Diagnosis not present

## 2022-02-04 DIAGNOSIS — E041 Nontoxic single thyroid nodule: Secondary | ICD-10-CM | POA: Diagnosis not present

## 2022-02-04 DIAGNOSIS — I10 Essential (primary) hypertension: Secondary | ICD-10-CM | POA: Diagnosis not present

## 2022-02-04 DIAGNOSIS — E785 Hyperlipidemia, unspecified: Secondary | ICD-10-CM | POA: Diagnosis not present

## 2022-02-04 DIAGNOSIS — R7989 Other specified abnormal findings of blood chemistry: Secondary | ICD-10-CM | POA: Diagnosis not present

## 2022-02-09 DIAGNOSIS — Z0000 Encounter for general adult medical examination without abnormal findings: Secondary | ICD-10-CM | POA: Diagnosis not present

## 2022-02-10 ENCOUNTER — Ambulatory Visit (HOSPITAL_COMMUNITY): Payer: Medicare PPO | Attending: Internal Medicine

## 2022-02-10 DIAGNOSIS — I4821 Permanent atrial fibrillation: Secondary | ICD-10-CM

## 2022-02-10 DIAGNOSIS — Q231 Congenital insufficiency of aortic valve: Secondary | ICD-10-CM | POA: Diagnosis not present

## 2022-02-10 LAB — ECHOCARDIOGRAM COMPLETE
AR max vel: 0.7 cm2
AV Area VTI: 0.72 cm2
AV Area mean vel: 0.73 cm2
AV Mean grad: 21 mmHg
AV Peak grad: 38 mmHg
Ao pk vel: 3.08 m/s
Area-P 1/2: 3.12 cm2
P 1/2 time: 919 msec
S' Lateral: 3.3 cm

## 2022-02-11 ENCOUNTER — Ambulatory Visit
Admission: RE | Admit: 2022-02-11 | Discharge: 2022-02-11 | Disposition: A | Discharge status: Home / Self Care | Payer: Medicare PPO | Source: Ambulatory Visit | Attending: Internal Medicine | Admitting: Internal Medicine

## 2022-02-11 ENCOUNTER — Other Ambulatory Visit: Payer: Self-pay | Admitting: Internal Medicine

## 2022-02-11 ENCOUNTER — Telehealth: Payer: Self-pay | Admitting: *Deleted

## 2022-02-11 DIAGNOSIS — N529 Male erectile dysfunction, unspecified: Secondary | ICD-10-CM | POA: Diagnosis not present

## 2022-02-11 DIAGNOSIS — G459 Transient cerebral ischemic attack, unspecified: Secondary | ICD-10-CM | POA: Diagnosis not present

## 2022-02-11 DIAGNOSIS — Z1331 Encounter for screening for depression: Secondary | ICD-10-CM | POA: Diagnosis not present

## 2022-02-11 DIAGNOSIS — I10 Essential (primary) hypertension: Secondary | ICD-10-CM | POA: Diagnosis not present

## 2022-02-11 DIAGNOSIS — R718 Other abnormality of red blood cells: Secondary | ICD-10-CM | POA: Diagnosis not present

## 2022-02-11 DIAGNOSIS — E041 Nontoxic single thyroid nodule: Secondary | ICD-10-CM | POA: Diagnosis not present

## 2022-02-11 DIAGNOSIS — Z0000 Encounter for general adult medical examination without abnormal findings: Secondary | ICD-10-CM | POA: Diagnosis not present

## 2022-02-11 DIAGNOSIS — Z23 Encounter for immunization: Secondary | ICD-10-CM | POA: Diagnosis not present

## 2022-02-11 DIAGNOSIS — R82998 Other abnormal findings in urine: Secondary | ICD-10-CM | POA: Diagnosis not present

## 2022-02-11 DIAGNOSIS — H6123 Impacted cerumen, bilateral: Secondary | ICD-10-CM | POA: Diagnosis not present

## 2022-02-11 DIAGNOSIS — E785 Hyperlipidemia, unspecified: Secondary | ICD-10-CM | POA: Diagnosis not present

## 2022-02-11 DIAGNOSIS — D6869 Other thrombophilia: Secondary | ICD-10-CM | POA: Diagnosis not present

## 2022-02-11 NOTE — Telephone Encounter (Signed)
Looks like low-flow low gradient severe aortic stenosis with functional bicuspid aortic valve.  Lets have him referred to Dr. Lenna Sciara with structural heart team for further discussion/management strategies.   Candee Furbish, MD   Pt aware of the results and need for referral to structural heart team.  Pt aware he will be contacted to be scheduled.  He will be unavailable between 8/12-26.

## 2022-02-12 NOTE — Telephone Encounter (Signed)
Pt scheduled for structural heart team evaluation on 03/17/22 with Dr Ali Lowe.

## 2022-02-22 ENCOUNTER — Other Ambulatory Visit: Payer: Self-pay

## 2022-02-22 ENCOUNTER — Other Ambulatory Visit: Payer: Self-pay | Admitting: Cardiology

## 2022-03-11 DIAGNOSIS — H9193 Unspecified hearing loss, bilateral: Secondary | ICD-10-CM | POA: Diagnosis not present

## 2022-03-11 DIAGNOSIS — H6123 Impacted cerumen, bilateral: Secondary | ICD-10-CM | POA: Diagnosis not present

## 2022-03-16 ENCOUNTER — Other Ambulatory Visit: Payer: Self-pay | Admitting: Cardiology

## 2022-03-16 DIAGNOSIS — I4821 Permanent atrial fibrillation: Secondary | ICD-10-CM

## 2022-03-16 NOTE — Progress Notes (Unsigned)
Patient ID: Brian Klein MRN: [id number] DOB/AGE: Feb 18, 1946 76 y.o.  Primary Care Physician:Klein, Brian Guadeloupe, MD Primary Cardiologist: Candee Furbish, MD   FOCUSED CARDIOVASCULAR PROBLEM LIST:   1.  Paradoxical low-flow low gradient severe bicuspid aortic stenosis with an aortic valve area of 0.67 cm, dimensionless index of 0.23, mean gradient of 21 mmHg, stroke-volume index of less than 35, and ejection fraction of 55 to 60%; EKG with atrial fibrillation with no bundle-branch blocks 2.  Hypertension 3.  Atrial fibrillation on Xarelto 4.  Hyperlipidemia 5.  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 male with the indicated medical history here for recommendations regarding his aortic stenosis.  He was seen by Dr. Marlou Porch in September 2022 and reported some shortness of breath with moderate activity.  An echocardiogram was obtained recently which demonstrated progression of his aortic stenosis.  The patient is here with his wife.  They are retired Veterinary surgeon.  He has noticed increasing shortness of breath.  It seems that he is relatively active but at a relatively low level.  It takes him about 60 minutes to walk 3 miles.  He agrees this is a relatively slow pace.  Used to be able to jog a few years ago but stopped due to shortness of breath.  He is unable to keep up with his grandchildren due to shortness of breath.  He denies any exertional angina, or syncope.  Sometimes when he bends over he will get slightly lightheaded however.  He fortunately has not required any hospitalizations or emergency room visits.  He has had no severe bleeding or bruising episodes while on Xarelto.  He sees a Pharmacist, community on a regular basis and reports very good dental health.     Past Medical History:  Diagnosis Date   Abnormal CT scan 11/26/2015   Atrial fibrillation (HCC)    AVD (aortic valve disease)    with  bicupsid aortic valve   Chronic anticoagulation 11/26/2015   Erectile dysfunction 01/10/2013   Hypercholesterolemia 12/16/2010   Hyperlipidemia    Hypertension    Mild aortic stenosis    MVP (mitral valve prolapse)    Osteogenic sarcoma (HCC) 1976   jaw bone   PAF (paroxysmal atrial fibrillation) ([place]) 11/26/2015   Seasonal allergies 11/19/2014    Past Surgical History:  Procedure Laterality Date   EPIGASTRIC HERNIA REPAIR N/A 05/19/2020   Procedure: OPEN EPIGASTRIC HERNIA REPAIR WITH MESH;  Surgeon: Kinsinger, Arta Bruce, MD;  Location: [facility];  Service: General;  Laterality: N/A;   EYELID LACERATION REPAIR  2008   left and right   [place]   right removal -- cancer   MANDIBLE SURGERY  1987   right reconstruction   TRANSTHORACIC ECHOCARDIOGRAM  07/18/2002   ef 71%    Family History  Problem Relation Age of Onset   Stroke Mother    Heart attack Father    Hypertension Father    Hypertension Sister    Colon cancer Neg Hx    Stomach cancer Neg Hx    Rectal cancer Neg Hx    Esophageal cancer Neg Hx     Social History   Socioeconomic History   Marital status: Married    Spouse name: Brian Klein   Number of children: 2   Years of education: Not on file   Highest education level: Not on file  Occupational History  Occupation: retired  Tobacco Use   Smoking status: Never   Smokeless tobacco: Never  Vaping Use   Vaping Use: Never used  Substance and Sexual Activity   Alcohol use: No   Drug use: No   Sexual activity: Not on file  Other Topics Concern   Not on file  Social History Narrative   Not on file   Social Determinants of Health   Financial Resource Strain: Not on file  Food Insecurity: Not on file  Transportation Needs: Not on file  Physical Activity: Not on file  Stress: Not on file  Social Connections: Not on file  Intimate Partner Violence: Not on file     Prior to Admission medications   Medication Sig Start Date End Date Taking? Authorizing  Provider  amLODipine (NORVASC) 5 MG tablet Take 5 mg by mouth daily.  08/17/18   [provider]  amoxicillin (AMOXIL) 500 MG capsule TAKE 4 CAPSULES BY MOUTH 1 HOUR BEFORE DENTAL APPOINTMENT 09/04/21   Jerline Pain, MD  BELSOMRA 20 MG TABS Take 1 tablet by mouth at bedtime as needed. 01/20/22   [provider]  Cholecalciferol (VITAMIN D) 2000 UNITS tablet Take 2,000 Units by mouth daily.    [provider]  Coenzyme Q10 (CO Q 10 PO) Take 1 capsule by mouth daily.     [provider]  ezetimibe-simvastatin (VYTORIN) 10-40 MG per tablet Take 1 tablet by mouth at bedtime.    [provider]  fish oil-omega-3 fatty acids 1000 MG capsule Take 1 g by mouth daily.    [provider]  fluticasone (FLONASE) 50 MCG/ACT nasal spray Place 1 spray into both nostrils daily as needed for allergies or rhinitis.     [provider]  metoprolol succinate (TOPROL-XL) 25 MG 24 hr tablet TAKE 1/2 TABLET(12.5 MG) BY MOUTH DAILY 02/22/22   Jerline Pain, MD  olmesartan (BENICAR) 20 MG tablet Take 20 mg by mouth daily.    [provider]  XARELTO 20 MG TABS tablet TAKE 1 TABLET(20 MG) BY MOUTH DAILY 09/15/21   Jerline Pain, MD    Allergies  Allergen Reactions   Rosuvastatin     Other reaction(s): Unknown    REVIEW OF SYSTEMS:  General: no fevers/chills/night sweats Eyes: no blurry vision, diplopia, or amaurosis ENT: no sore throat or hearing loss Resp: no cough, wheezing, or hemoptysis CV: no edema or palpitations GI: no abdominal pain, nausea, vomiting, diarrhea, or constipation GU: no dysuria, frequency, or hematuria Skin: no rash Neuro: no headache, numbness, tingling, or weakness of extremities Musculoskeletal: no joint pain or swelling Heme: no bleeding, DVT, or easy bruising Endo: no polydipsia or polyuria  BP 104/70   Pulse 75   Ht '5\' 6"'$  (1.676 m)   Wt 160 lb (72.6 kg)   SpO2 97%   BMI 25.82 kg/m   PHYSICAL EXAM: GEN:   AO x 3 in no acute distress HEENT: normal Dentition: Normal Neck: JVP normal. +2 carotid upstrokes without bruits. No thyromegaly. Lungs: equal expansion, clear bilaterally CV: Apex is discrete and nondisplaced, irregular rate and rhythm with 2 out of 6 systolic murmur Abd: soft, non-tender, non-distended; no bruit; positive bowel sounds Ext: no edema, ecchymoses, or cyanosis Vascular: 2+ femoral pulses, 2+ radial pulses       Skin: warm and dry without rash Neuro: CN II-XII grossly intact; motor and sensory grossly intact    DATA AND STUDIES:  EKG: September 2022 atrial fibrillation with no bundle-branch  blocks; EKG today with rate controlled atrial fibrillation  2D ECHO: See details dictated above  CARDIAC CATH: n/a  STS RISK CALCULATOR: pending  NHYA CLASS: 2    ASSESSMENT AND PLAN:   Nonrheumatic aortic valve stenosis - Plan: Basic metabolic panel, CBC with Differential/Platelet  Atrial fibrillation, unspecified type ([place]) - Plan: EKG 40-CXKG, Basic metabolic panel, CBC with Differential/Platelet  Hypertension, unspecified type - Plan: Basic metabolic panel, CBC with Differential/Platelet  Hypercholesterolemia - Plan: Basic metabolic panel, CBC with Differential/Platelet  The patient has developed symptoms attributable to his paradoxical low-flow low gradient aortic stenosis (stage D3).  I had a conversation with he and his wife about potential treatment options.  It looks like he does have a bicuspid valve on review of his transthoracic echocardiogram.  I told him that there is an association with ascending aortic aneurysm.  Given his ongoing lifestyle limiting symptoms he would like to pursue an evaluation for potential intervention.  I will refer him for coronary angiography and right heart catheterization, TAVR protocol CTA, and cardiothoracic surgical opinion.  Certainly if he has a ascending aortic aneurysm then surgery would be his best option and the patient and his  wife understand and agree.  Further recommendations will be issued when all imaging data has been reviewed.  If the patient requires TAVR then we will plan on changing him to apixaban given the results of the [facility] trial.  I have personally reviewed the patients imaging data as summarized above.  I have reviewed the natural history of aortic stenosis with the patient and family members who are present today. We have discussed the limitations of medical therapy and the poor prognosis associated with symptomatic aortic stenosis. We have also reviewed potential treatment options, including palliative medical therapy, conventional surgical aortic valve replacement, and transcatheter aortic valve replacement. We discussed treatment options in the context of this patient's specific comorbid medical conditions.   All of the patient's questions were answered today. Will make further recommendations based on the results of studies outlined above.   Total time spent with patient today 60 minutes. This includes reviewing records, evaluating the patient and coordinating care.   Early Osmond, MD  03/17/2022 9:19 AM    [address], [place], [place]  [address] Phone: [phone]; Fax: [phone]

## 2022-03-16 NOTE — H&P (View-Only) (Signed)
Patient ID: Brian Klein MRN: [id number] DOB/AGE: [birth year] 78 y.o.  Primary Care Physician:Perini, Elta Guadeloupe, MD Primary Cardiologist: Candee Furbish, MD   FOCUSED CARDIOVASCULAR PROBLEM LIST:   1.  Paradoxical low-flow low gradient severe bicuspid aortic stenosis with an aortic valve area of 0.67 cm, dimensionless index of 0.23, mean gradient of 21 mmHg, stroke-volume index of less than 35, and ejection fraction of 55 to 60%; EKG with atrial fibrillation with no bundle-branch blocks 2.  Hypertension 3.  Atrial fibrillation on Xarelto 4.  Hyperlipidemia 5.  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 male with the indicated medical history here for recommendations regarding his aortic stenosis.  He was seen by Dr. Marlou Porch in September 2022 and reported some shortness of breath with moderate activity.  An echocardiogram was obtained recently which demonstrated progression of his aortic stenosis.  The patient is here with his wife.  They are retired Veterinary surgeon.  He has noticed increasing shortness of breath.  It seems that he is relatively active but at a relatively low level.  It takes him about 60 minutes to walk 3 miles.  He agrees this is a relatively slow pace.  Used to be able to jog a few years ago but stopped due to shortness of breath.  He is unable to keep up with his grandchildren due to shortness of breath.  He denies any exertional angina, or syncope.  Sometimes when he bends over he will get slightly lightheaded however.  He fortunately has not required any hospitalizations or emergency room visits.  He has had no severe bleeding or bruising episodes while on Xarelto.  He sees a Pharmacist, community on a regular basis and reports very good dental health.     Past Medical History:  Diagnosis Date   Abnormal CT scan 11/26/2015   Atrial fibrillation (HCC)    AVD (aortic valve disease)    with  bicupsid aortic valve   Chronic anticoagulation 11/26/2015   Erectile dysfunction 01/10/2013   Hypercholesterolemia 12/16/2010   Hyperlipidemia    Hypertension    Mild aortic stenosis    MVP (mitral valve prolapse)    Osteogenic sarcoma (HCC) 1976   jaw bone   PAF (paroxysmal atrial fibrillation) ([place]) 11/26/2015   Seasonal allergies 11/19/2014    Past Surgical History:  Procedure Laterality Date   EPIGASTRIC HERNIA REPAIR N/A 05/19/2020   Procedure: OPEN EPIGASTRIC HERNIA REPAIR WITH MESH;  Surgeon: Kinsinger, Arta Bruce, MD;  Location: [facility];  Service: General;  Laterality: N/A;   EYELID LACERATION REPAIR  2008   left and right   [place]   right removal -- cancer   MANDIBLE SURGERY  1987   right reconstruction   TRANSTHORACIC ECHOCARDIOGRAM  07/18/2002   ef 71%    Family History  Problem Relation Age of Onset   Stroke Mother    Heart attack Father    Hypertension Father    Hypertension Sister    Colon cancer Neg Hx    Stomach cancer Neg Hx    Rectal cancer Neg Hx    Esophageal cancer Neg Hx     Social History   Socioeconomic History   Marital status: Married    Spouse name: Brian Klein   Number of children: 2   Years of education: Not on file   Highest education level: Not on file  Occupational History  Occupation: retired  Tobacco Use   Smoking status: Never   Smokeless tobacco: Never  Vaping Use   Vaping Use: Never used  Substance and Sexual Activity   Alcohol use: No   Drug use: No   Sexual activity: Not on file  Other Topics Concern   Not on file  Social History Narrative   Not on file   Social Determinants of Health   Financial Resource Strain: Not on file  Food Insecurity: Not on file  Transportation Needs: Not on file  Physical Activity: Not on file  Stress: Not on file  Social Connections: Not on file  Intimate Partner Violence: Not on file     Prior to Admission medications   Medication Sig Start Date End Date Taking? Authorizing  Provider  amLODipine (NORVASC) 5 MG tablet Take 5 mg by mouth daily.  08/17/18   [provider]  amoxicillin (AMOXIL) 500 MG capsule TAKE 4 CAPSULES BY MOUTH 1 HOUR BEFORE DENTAL APPOINTMENT 09/04/21   Jerline Pain, MD  BELSOMRA 20 MG TABS Take 1 tablet by mouth at bedtime as needed. 01/20/22   [provider]  Cholecalciferol (VITAMIN D) 2000 UNITS tablet Take 2,000 Units by mouth daily.    [provider]  Coenzyme Q10 (CO Q 10 PO) Take 1 capsule by mouth daily.     [provider]  ezetimibe-simvastatin (VYTORIN) 10-40 MG per tablet Take 1 tablet by mouth at bedtime.    [provider]  fish oil-omega-3 fatty acids 1000 MG capsule Take 1 g by mouth daily.    [provider]  fluticasone (FLONASE) 50 MCG/ACT nasal spray Place 1 spray into both nostrils daily as needed for allergies or rhinitis.     [provider]  metoprolol succinate (TOPROL-XL) 25 MG 24 hr tablet TAKE 1/2 TABLET(12.5 MG) BY MOUTH DAILY 02/22/22   Jerline Pain, MD  olmesartan (BENICAR) 20 MG tablet Take 20 mg by mouth daily.    [provider]  XARELTO 20 MG TABS tablet TAKE 1 TABLET(20 MG) BY MOUTH DAILY 09/15/21   Jerline Pain, MD    Allergies  Allergen Reactions   Rosuvastatin     Other reaction(s): Unknown    REVIEW OF SYSTEMS:  General: no fevers/chills/night sweats Eyes: no blurry vision, diplopia, or amaurosis ENT: no sore throat or hearing loss Resp: no cough, wheezing, or hemoptysis CV: no edema or palpitations GI: no abdominal pain, nausea, vomiting, diarrhea, or constipation GU: no dysuria, frequency, or hematuria Skin: no rash Neuro: no headache, numbness, tingling, or weakness of extremities Musculoskeletal: no joint pain or swelling Heme: no bleeding, DVT, or easy bruising Endo: no polydipsia or polyuria  BP 104/70   Pulse 75   Ht '5\' 6"'$  (1.676 m)   Wt 160 lb (72.6 kg)   SpO2 97%   BMI 25.82 kg/m   PHYSICAL EXAM: GEN:   AO x 3 in no acute distress HEENT: normal Dentition: Normal Neck: JVP normal. +2 carotid upstrokes without bruits. No thyromegaly. Lungs: equal expansion, clear bilaterally CV: Apex is discrete and nondisplaced, irregular rate and rhythm with 2 out of 6 systolic murmur Abd: soft, non-tender, non-distended; no bruit; positive bowel sounds Ext: no edema, ecchymoses, or cyanosis Vascular: 2+ femoral pulses, 2+ radial pulses       Skin: warm and dry without rash Neuro: CN II-XII grossly intact; motor and sensory grossly intact    DATA AND STUDIES:  EKG: September 2022 atrial fibrillation with no bundle-branch  blocks; EKG today with rate controlled atrial fibrillation  2D ECHO: See details dictated above  CARDIAC CATH: n/a  STS RISK CALCULATOR: pending  NHYA CLASS: 2    ASSESSMENT AND PLAN:   Nonrheumatic aortic valve stenosis - Plan: Basic metabolic panel, CBC with Differential/Platelet  Atrial fibrillation, unspecified type ([place]) - Plan: EKG 67-YPPJ, Basic metabolic panel, CBC with Differential/Platelet  Hypertension, unspecified type - Plan: Basic metabolic panel, CBC with Differential/Platelet  Hypercholesterolemia - Plan: Basic metabolic panel, CBC with Differential/Platelet  The patient has developed symptoms attributable to his paradoxical low-flow low gradient aortic stenosis (stage D3).  I had a conversation with he and his wife about potential treatment options.  It looks like he does have a bicuspid valve on review of his transthoracic echocardiogram.  I told him that there is an association with ascending aortic aneurysm.  Given his ongoing lifestyle limiting symptoms he would like to pursue an evaluation for potential intervention.  I will refer him for coronary angiography and right heart catheterization, TAVR protocol CTA, and cardiothoracic surgical opinion.  Certainly if he has a ascending aortic aneurysm then surgery would be his best option and the patient and his  wife understand and agree.  Further recommendations will be issued when all imaging data has been reviewed.  If the patient requires TAVR then we will plan on changing him to apixaban given the results of the [facility] trial.  I have personally reviewed the patients imaging data as summarized above.  I have reviewed the natural history of aortic stenosis with the patient and family members who are present today. We have discussed the limitations of medical therapy and the poor prognosis associated with symptomatic aortic stenosis. We have also reviewed potential treatment options, including palliative medical therapy, conventional surgical aortic valve replacement, and transcatheter aortic valve replacement. We discussed treatment options in the context of this patient's specific comorbid medical conditions.   All of the patient's questions were answered today. Will make further recommendations based on the results of studies outlined above.   Total time spent with patient today 60 minutes. This includes reviewing records, evaluating the patient and coordinating care.   Early Osmond, MD  03/17/2022 9:19 AM    [address], [place], [place]  [address] Phone: [phone]; Fax: [phone]

## 2022-03-16 NOTE — Telephone Encounter (Addendum)
Pt last saw Dr Marlou Porch 04/07/21, pt has upcoming appt on 03/17/22 with Dr Ali Lowe.  Last labs 12/10/20 Creat 1.2, pt is overdue for labwork.  Will place a note on pt's appt tomorrow needs CBC and BMP at [facility].  Will await results to refill rx.

## 2022-03-17 ENCOUNTER — Other Ambulatory Visit: Payer: Self-pay | Admitting: Cardiology

## 2022-03-17 ENCOUNTER — Encounter: Payer: Self-pay | Admitting: Internal Medicine

## 2022-03-17 ENCOUNTER — Ambulatory Visit: Payer: Medicare PPO | Attending: Internal Medicine | Admitting: Internal Medicine

## 2022-03-17 VITALS — BP 104/70 | HR 75 | Ht 66.0 in | Wt 160.0 lb

## 2022-03-17 DIAGNOSIS — I10 Essential (primary) hypertension: Secondary | ICD-10-CM | POA: Diagnosis not present

## 2022-03-17 DIAGNOSIS — I350 Nonrheumatic aortic (valve) stenosis: Secondary | ICD-10-CM | POA: Diagnosis not present

## 2022-03-17 DIAGNOSIS — I4821 Permanent atrial fibrillation: Secondary | ICD-10-CM

## 2022-03-17 DIAGNOSIS — E7800 Pure hypercholesterolemia, unspecified: Secondary | ICD-10-CM | POA: Diagnosis not present

## 2022-03-17 DIAGNOSIS — I4891 Unspecified atrial fibrillation: Secondary | ICD-10-CM

## 2022-03-17 LAB — CBC WITH DIFFERENTIAL/PLATELET
Basophils Absolute: 0 10*3/uL (ref 0.0–0.2)
Basos: 1 %
EOS (ABSOLUTE): 0 10*3/uL (ref 0.0–0.4)
Eos: 0 %
Hematocrit: 38.6 % (ref 37.5–51.0)
Hemoglobin: 13.3 g/dL (ref 13.0–17.7)
Lymphocytes Absolute: 1.7 10*3/uL (ref 0.7–3.1)
Lymphs: 30 %
MCH: 26.5 pg — ABNORMAL LOW (ref 26.6–33.0)
MCHC: 34.5 g/dL (ref 31.5–35.7)
MCV: 77 fL — ABNORMAL LOW (ref 79–97)
Monocytes Absolute: 1.3 10*3/uL — ABNORMAL HIGH (ref 0.1–0.9)
Monocytes: 24 %
Neutrophils Absolute: 2.4 10*3/uL (ref 1.4–7.0)
Neutrophils: 45 %
Platelets: 141 10*3/uL — ABNORMAL LOW (ref 150–450)
RBC: 5.02 x10E6/uL (ref 4.14–5.80)
RDW: 15.4 % (ref 11.6–15.4)
WBC: 5.5 10*3/uL (ref 3.4–10.8)

## 2022-03-17 LAB — BASIC METABOLIC PANEL
BUN/Creatinine Ratio: 12 (ref 10–24)
BUN: 11 mg/dL (ref 8–27)
CO2: 27 mmol/L (ref 20–29)
Calcium: 9.7 mg/dL (ref 8.6–10.2)
Chloride: 105 mmol/L (ref 96–106)
Creatinine, Ser: 0.92 mg/dL (ref 0.76–1.27)
Glucose: 92 mg/dL (ref 70–99)
Potassium: 4.3 mmol/L (ref 3.5–5.2)
Sodium: 139 mmol/L (ref 134–144)
eGFR: 85 mL/min/{1.73_m2} (ref >59)

## 2022-03-17 NOTE — Progress Notes (Addendum)
Pre Surgical Assessment: 5 M Walk Test  37M=16.77f  5 Meter Walk Test- trial 1: 4.72 seconds 5 Meter Walk Test- trial 2: 4.7 seconds 5 Meter Walk Test- trial 3: 5.1 seconds 5 Meter Walk Test Average: 4.84 seconds  Procedure Type: Isolated AVR Perioperative Outcome Estimate % Operative Mortality 1.8% Morbidity & Mortality 7.08% Stroke 0.958% Renal Failure 0.847% Reoperation 4.68% Prolonged Ventilation 2.73% Deep Sternal Wound Infection 0.038% LOasis HospitalStay (>14 days) 2.63% Short Hospital Stay (<6 days)* 55.7%

## 2022-03-17 NOTE — Telephone Encounter (Signed)
Prescription refill request for Xarelto received.  Indication: Last office visit:03/17/22 Brian Klein)  Weight: 72.6kg Age: 76 Scr:0.92 (03/17/22)  CrCl: 67.22m/min  Appropriate dose and refill sent to requested pharmacy.

## 2022-03-17 NOTE — Patient Instructions (Signed)
Medication Instructions:  Your physician recommends that you continue on your current medications as directed. Please refer to the Current Medication list given to you today.  *If you need a refill on your cardiac medications before your next appointment, please call your pharmacy*  Lab Work: Your physician recommends that you have lab work today- BMET and CBC  If you have labs (blood work) drawn today and your tests are completely normal, you will receive your results only by: MyChart Message (if you have MyChart) OR A paper copy in the mail If you have any lab test that is abnormal or we need to change your treatment, we will call you to review the results.  Testing/Procedures: Your physician has requested that you have a cardiac catheterization. Cardiac catheterization is used to diagnose and/or treat various heart conditions. Doctors may recommend this procedure for a number of different reasons. The most common reason is to evaluate chest pain. Chest pain can be a symptom of coronary artery disease (CAD), and cardiac catheterization can show whether plaque is narrowing or blocking your heart's arteries. This procedure is also used to evaluate the valves, as well as measure the blood flow and oxygen levels in different parts of your heart. For further information please visit [email or web address]. Please follow instruction sheet, as given.  Follow-Up: At [facility], you and your health needs are our priority.  As part of our continuing mission to provide you with exceptional heart care, we have created designated Provider Care Teams.  These Care Teams include your primary Cardiologist (physician) and Advanced Practice Providers (APPs -  Physician Assistants and Nurse Practitioners) who all work together to provide you with the care you need, when you need it.  We recommend signing up for the patient portal called "MyChart".  Sign up information is provided on this After Visit  Summary.  MyChart is used to connect with patients for Virtual Visits (Telemedicine).  Patients are able to view lab/test results, encounter notes, upcoming appointments, etc.  Non-urgent messages can be sent to your provider as well.   To learn more about what you can do with MyChart, go to [email or web address].    Your next appointment:   Follow up with be scheduled after heart catheterization  The format for your next appointment:   In Person  Provider:   Lenna Sciara, MD    Other Instructions  [facility] [facility] [place] [facility] DEPT OF [place]. [address], [place] [id number] [place] [address] Dept: [phone] Loc: [place] [facility]  03/17/2022  You are scheduled for a Cardiac Catheterization on Friday, September 15 with Dr. Lenna Sciara.  1. Please arrive at the [facility] (Main Entrance A) at [facility]: [address] [place], [place] [address] at 5:30 AM (This time is two hours before your procedure to ensure your preparation). Free valet parking service is available.   Special note: Every effort is made to have your procedure done on time. Please understand that emergencies sometimes delay scheduled procedures.  2. Diet: Do not eat solid foods after midnight.  The patient may have clear liquids until 5am upon the day of the procedure.  3. Labs: You will need to have blood drawn on today at office visit  4. Medication instructions in preparation for your procedure:   Contrast Allergy: No    Stop taking Xarelto (Rivaroxaban) on Wednesday, September  13.  Stop taking, Benicar (Olmesartan) Friday, September 15,   On the morning of your procedure, take your Aspirin and any morning medicines NOT listed above.  You may use sips of water.  5. Plan for one night stay--bring personal belongings. 6. Bring a current list of your medications and  current insurance cards. 7. You MUST have a responsible person to drive you home. 8. Someone MUST be with you the first 24 hours after you arrive home or your discharge will be delayed. 9. Please wear clothes that are easy to get on and off and wear slip-on shoes.  Thank you for allowing [place] to care for you!   -- [facility]   Important Information About Sugar

## 2022-03-18 DIAGNOSIS — I10 Essential (primary) hypertension: Secondary | ICD-10-CM | POA: Diagnosis not present

## 2022-03-18 DIAGNOSIS — R718 Other abnormality of red blood cells: Secondary | ICD-10-CM | POA: Diagnosis not present

## 2022-03-25 ENCOUNTER — Other Ambulatory Visit: Payer: Self-pay | Admitting: Physician Assistant

## 2022-03-25 ENCOUNTER — Telehealth: Payer: Self-pay | Admitting: *Deleted

## 2022-03-25 ENCOUNTER — Encounter: Payer: Self-pay | Admitting: Physician Assistant

## 2022-03-25 DIAGNOSIS — I350 Nonrheumatic aortic (valve) stenosis: Secondary | ICD-10-CM

## 2022-03-25 NOTE — Telephone Encounter (Addendum)
Cardiac Catheterization scheduled at [facility] for: Friday March 26, 2022 7:30 AM Arrival time and place: [place] Entrance A at: 5:30 AM  Nothing to eat after midnight prior to procedure, clear liquids until 5 AM day of procedure.  Medication instructions: -Hold:  Xarelto-none 03/24/22 until post procedure -Except hold medications usual morning medications can be taken with sips of water including aspirin 81 mg.  Confirmed patient has responsible adult to drive home post procedure and be with patient first 24 hours after arriving home.  Patient reports no new symptoms concerning for COVID-19 in the past 10 days.  Reviewed procedure instructions with patient.

## 2022-03-26 ENCOUNTER — Encounter (HOSPITAL_COMMUNITY): Payer: Self-pay | Admitting: Internal Medicine

## 2022-03-26 ENCOUNTER — Encounter (HOSPITAL_COMMUNITY)
Admission: RE | Disposition: A | Discharge status: Home / Self Care | Payer: Self-pay | Source: Ambulatory Visit | Attending: Internal Medicine

## 2022-03-26 ENCOUNTER — Ambulatory Visit (HOSPITAL_COMMUNITY)
Admission: RE | Admit: 2022-03-26 | Discharge: 2022-03-26 | Disposition: A | Discharge status: Home / Self Care | Payer: Medicare PPO | Source: Ambulatory Visit | Attending: Internal Medicine | Admitting: Internal Medicine

## 2022-03-26 ENCOUNTER — Other Ambulatory Visit: Payer: Self-pay

## 2022-03-26 DIAGNOSIS — I350 Nonrheumatic aortic (valve) stenosis: Secondary | ICD-10-CM

## 2022-03-26 DIAGNOSIS — I080 Rheumatic disorders of both mitral and aortic valves: Secondary | ICD-10-CM | POA: Diagnosis not present

## 2022-03-26 DIAGNOSIS — I10 Essential (primary) hypertension: Secondary | ICD-10-CM | POA: Insufficient documentation

## 2022-03-26 DIAGNOSIS — I4891 Unspecified atrial fibrillation: Secondary | ICD-10-CM | POA: Insufficient documentation

## 2022-03-26 DIAGNOSIS — I2510 Atherosclerotic heart disease of native coronary artery without angina pectoris: Secondary | ICD-10-CM

## 2022-03-26 DIAGNOSIS — Z7901 Long term (current) use of anticoagulants: Secondary | ICD-10-CM | POA: Diagnosis not present

## 2022-03-26 DIAGNOSIS — E7800 Pure hypercholesterolemia, unspecified: Secondary | ICD-10-CM | POA: Insufficient documentation

## 2022-03-26 DIAGNOSIS — Z85830 Personal history of malignant neoplasm of bone: Secondary | ICD-10-CM | POA: Insufficient documentation

## 2022-03-26 DIAGNOSIS — R0600 Dyspnea, unspecified: Secondary | ICD-10-CM | POA: Insufficient documentation

## 2022-03-26 HISTORY — PX: RIGHT/LEFT HEART CATH AND CORONARY ANGIOGRAPHY: CATH118266

## 2022-03-26 LAB — POCT I-STAT EG7
Acid-base deficit: 3 mmol/L — ABNORMAL HIGH (ref 0.0–2.0)
Acid-base deficit: 3 mmol/L — ABNORMAL HIGH (ref 0.0–2.0)
Acid-base deficit: 3 mmol/L — ABNORMAL HIGH (ref 0.0–2.0)
Bicarbonate: 21.5 mmol/L (ref 20.0–28.0)
Bicarbonate: 22.2 mmol/L (ref 20.0–28.0)
Bicarbonate: 22.1 mmol/L (ref 20.0–28.0)
Calcium, Ion: 1.21 mmol/L (ref 1.15–1.40)
Calcium, Ion: 1.27 mmol/L (ref 1.15–1.40)
Calcium, Ion: 1.21 mmol/L (ref 1.15–1.40)
HCT: 38 % — ABNORMAL LOW (ref 39.0–52.0)
HCT: 39 % (ref 39.0–52.0)
HCT: 38 % — ABNORMAL LOW (ref 39.0–52.0)
Hemoglobin: 12.9 g/dL — ABNORMAL LOW (ref 13.0–17.0)
Hemoglobin: 13.3 g/dL (ref 13.0–17.0)
Hemoglobin: 12.9 g/dL — ABNORMAL LOW (ref 13.0–17.0)
O2 Saturation: 72 %
O2 Saturation: 73 %
O2 Saturation: 74 %
Potassium: 3.8 mmol/L (ref 3.5–5.1)
Potassium: 4.1 mmol/L (ref 3.5–5.1)
Potassium: 3.9 mmol/L (ref 3.5–5.1)
Sodium: 140 mmol/L (ref 135–145)
Sodium: 142 mmol/L (ref 135–145)
Sodium: 141 mmol/L (ref 135–145)
TCO2: 23 mmol/L (ref 22–32)
TCO2: 23 mmol/L (ref 22–32)
TCO2: 23 mmol/L (ref 22–32)
pCO2, Ven: 37.7 mmHg — ABNORMAL LOW (ref 44–60)
pCO2, Ven: 39.8 mmHg — ABNORMAL LOW (ref 44–60)
pCO2, Ven: 38.9 mmHg — ABNORMAL LOW (ref 44–60)
pH, Ven: 7.355 (ref 7.25–7.43)
pH, Ven: 7.364 (ref 7.25–7.43)
pH, Ven: 7.363 (ref 7.25–7.43)
pO2, Ven: 39 mmHg (ref 32–45)
pO2, Ven: 40 mmHg (ref 32–45)
pO2, Ven: 41 mmHg (ref 32–45)

## 2022-03-26 LAB — POCT I-STAT 7, (LYTES, BLD GAS, ICA,H+H)
Acid-base deficit: 2 mmol/L (ref 0.0–2.0)
Bicarbonate: 21.9 mmol/L (ref 20.0–28.0)
Calcium, Ion: 1.2 mmol/L (ref 1.15–1.40)
HCT: 36 % — ABNORMAL LOW (ref 39.0–52.0)
Hemoglobin: 12.2 g/dL — ABNORMAL LOW (ref 13.0–17.0)
O2 Saturation: 97 %
Potassium: 4 mmol/L (ref 3.5–5.1)
Sodium: 140 mmol/L (ref 135–145)
TCO2: 23 mmol/L (ref 22–32)
pCO2 arterial: 35.7 mmHg (ref 32–48)
pH, Arterial: 7.397 (ref 7.35–7.45)
pO2, Arterial: 93 mmHg (ref 83–108)

## 2022-03-26 SURGERY — RIGHT/LEFT HEART CATH AND CORONARY ANGIOGRAPHY
Anesthesia: LOCAL

## 2022-03-26 MED ORDER — HEPARIN SODIUM (PORCINE) 1000 UNIT/ML IJ SOLN
INTRAMUSCULAR | Status: AC
  Filled 2022-03-26: qty 10

## 2022-03-26 MED ORDER — HYDRALAZINE HCL 20 MG/ML IJ SOLN: 10.0000 mg | INTRAMUSCULAR | Status: DC | PRN

## 2022-03-26 MED ORDER — SODIUM CHLORIDE 0.9% FLUSH: 3.0000 mL | Freq: Two times a day (BID) | INTRAVENOUS | Status: DC

## 2022-03-26 MED ORDER — SODIUM CHLORIDE 0.9% FLUSH: 3.0000 mL | INTRAVENOUS | Status: DC | PRN

## 2022-03-26 MED ORDER — HEPARIN (PORCINE) IN NACL 1000-0.9 UT/500ML-% IV SOLN
INTRAVENOUS | Status: DC | PRN
  Administered 2022-03-26 (×2): 500 mL

## 2022-03-26 MED ORDER — SODIUM CHLORIDE 0.9 % WEIGHT BASED INFUSION
3.0000 mL/kg/h | INTRAVENOUS | Status: AC
  Administered 2022-03-26: 3 mL/kg/h via INTRAVENOUS

## 2022-03-26 MED ORDER — LABETALOL HCL 5 MG/ML IV SOLN: 10.0000 mg | INTRAVENOUS | Status: DC | PRN

## 2022-03-26 MED ORDER — LIDOCAINE HCL (PF) 1 % IJ SOLN
INTRAMUSCULAR | Status: DC | PRN
  Administered 2022-03-26 (×2): 2 mL

## 2022-03-26 MED ORDER — MIDAZOLAM HCL 2 MG/2ML IJ SOLN
INTRAMUSCULAR | Status: DC | PRN
  Administered 2022-03-26: 1 mg via INTRAVENOUS

## 2022-03-26 MED ORDER — FENTANYL CITRATE (PF) 100 MCG/2ML IJ SOLN
INTRAMUSCULAR | Status: AC
  Filled 2022-03-26: qty 2

## 2022-03-26 MED ORDER — ONDANSETRON HCL 4 MG/2ML IJ SOLN: 4.0000 mg | Freq: Four times a day (QID) | INTRAMUSCULAR | Status: DC | PRN

## 2022-03-26 MED ORDER — VERAPAMIL HCL 2.5 MG/ML IV SOLN
INTRAVENOUS | Status: AC
  Filled 2022-03-26: qty 2

## 2022-03-26 MED ORDER — VERAPAMIL HCL 2.5 MG/ML IV SOLN
INTRAVENOUS | Status: DC | PRN
  Administered 2022-03-26: 10 mL via INTRA_ARTERIAL

## 2022-03-26 MED ORDER — ASPIRIN 81 MG PO CHEW: 81.0000 mg | CHEWABLE_TABLET | ORAL | Status: DC

## 2022-03-26 MED ORDER — ACETAMINOPHEN 325 MG PO TABS: 650.0000 mg | ORAL_TABLET | ORAL | Status: DC | PRN

## 2022-03-26 MED ORDER — MIDAZOLAM HCL 2 MG/2ML IJ SOLN
INTRAMUSCULAR | Status: AC
  Filled 2022-03-26: qty 2

## 2022-03-26 MED ORDER — SODIUM CHLORIDE 0.9 % WEIGHT BASED INFUSION: 1.0000 mL/kg/h | INTRAVENOUS | Status: DC

## 2022-03-26 MED ORDER — LIDOCAINE HCL (PF) 1 % IJ SOLN
INTRAMUSCULAR | Status: AC
  Filled 2022-03-26: qty 30

## 2022-03-26 MED ORDER — SODIUM CHLORIDE 0.9 % IV SOLN: INTRAVENOUS | Status: DC

## 2022-03-26 MED ORDER — FENTANYL CITRATE (PF) 100 MCG/2ML IJ SOLN
INTRAMUSCULAR | Status: DC | PRN
  Administered 2022-03-26: 25 ug via INTRAVENOUS

## 2022-03-26 MED ORDER — HEPARIN (PORCINE) IN NACL 1000-0.9 UT/500ML-% IV SOLN
INTRAVENOUS | Status: AC
  Filled 2022-03-26: qty 1000

## 2022-03-26 MED ORDER — SODIUM CHLORIDE 0.9 % IV SOLN: 250.0000 mL | INTRAVENOUS | Status: DC | PRN

## 2022-03-26 MED ORDER — IOHEXOL 350 MG/ML SOLN
INTRAVENOUS | Status: DC | PRN
  Administered 2022-03-26: 85 mL

## 2022-03-26 MED ORDER — HEPARIN SODIUM (PORCINE) 1000 UNIT/ML IJ SOLN
INTRAMUSCULAR | Status: DC | PRN
  Administered 2022-03-26: 5000 [IU] via INTRAVENOUS

## 2022-03-26 SURGICAL SUPPLY — 15 items
CATH BALLN WEDGE 5F 110CM (CATHETERS) IMPLANT
CATH DIAG 6FR PIGTAIL ANGLED (CATHETERS) IMPLANT
CATH OPTITORQUE TIG 4.0 6F (CATHETERS) IMPLANT
DEVICE RAD COMP TR BAND LRG (VASCULAR PRODUCTS) IMPLANT
GLIDESHEATH SLEND SS 6F .021 (SHEATH) IMPLANT
GUIDEWIRE .025 260CM (WIRE) IMPLANT
GUIDEWIRE INQWIRE 1.5J.035X260 (WIRE) IMPLANT
INQWIRE 1.5J .035X260CM (WIRE) ×1
KIT HEART LEFT (KITS) ×2 IMPLANT
PACK CARDIAC CATHETERIZATION (CUSTOM PROCEDURE TRAY) ×2 IMPLANT
SHEATH GLIDE SLENDER 4/5FR (SHEATH) IMPLANT
SYR MEDRAD MARK 7 150ML (SYRINGE) ×2 IMPLANT
TRANSDUCER W/STOPCOCK (MISCELLANEOUS) ×2 IMPLANT
TUBING CIL FLEX 10 FLL-RA (TUBING) ×2 IMPLANT
TUBING CONTRAST HIGH PRESS 48 (TUBING) IMPLANT

## 2022-03-26 NOTE — Interval H&P Note (Signed)
History and Physical Interval Note:  03/26/2022 6:36 AM  Brian Klein III  has presented today for surgery, with the diagnosis of pre tavr.  The various methods of treatment have been discussed with the patient and family. After consideration of risks, benefits and other options for treatment, the patient has consented to  Procedure(s): RIGHT/LEFT HEART CATH AND CORONARY ANGIOGRAPHY (N/A) as a surgical intervention.  The patient's history has been reviewed, patient examined, no change in status, stable for surgery.  I have reviewed the patient's chart and labs.  Questions were answered to the patient's satisfaction.     Early Osmond

## 2022-03-26 NOTE — Discharge Instructions (Addendum)
Restart Xarelto 03/26/22 8pm Radial Site Care Drink plenty of fluid for the next 3 days  Keep arm elevated for the next 24 hours The following information offers guidance on how to care for yourself after your procedure. Your health care provider may also give you more specific instructions. If you have problems or questions, contact your health care provider. What can I expect after the procedure? After the procedure, it is common to have bruising and tenderness in the incision area. Follow these instructions at home: Incision site care  Follow instructions from your health care provider about how to take care of your incision site. Make sure you:  Remove your dressing 24 hours after discharge.  Do not take baths, swim, or use a hot tub for 1 week. You may shower 24 hours after the procedure or as told by your health care provider. Remove the dressing and gently wash the incision area with plain soap and water. Pat the area dry with a clean towel. Do not rub the site. That could cause bleeding. Do not apply powder or lotion to the site. Check your incision site every day for signs of infection. Check for: Redness, swelling, or pain. Fluid or blood. Warmth. Pus or a bad smell. Activity For 24 hours after the procedure, or as directed by your health care provider: Do not flex or bend the affected arm. Do not push or pull heavy objects with the affected arm. Do not operate machinery or power tools. Do not drive. You should not drive yourself home from the hospital or clinic if you go home during that time period. You may drive 24 hours after the procedure unless your health care provider tells you not to. Do not lift anything that is heavier than 10 lb (4.5 kg), or the limit that you are told, until your health care provider says that it is safe. Return to your normal activities as told by your health care provider. Ask your health care provider what activities are safe for you and when  you can return to work. If you were given a sedative during the procedure, it can affect you for several hours. Do not drive or operate machinery until your health care provider says that it is safe. General instructions Take over-the-counter and prescription medicines only as told by your health care provider. If you will be going home right after the procedure, plan to have a responsible adult care for you for the time you are told. This is important. Keep all follow-up visits. This is important. Contact a health care provider if: You have a fever or chills. You have any of these signs of infection at your incision site: Redness, swelling, or pain. Fluid or blood. Warmth. Pus or a bad smell. Get help right away if: The incision area swells very fast. The incision area is bleeding, and the bleeding does not stop when you hold steady pressure on the area. Your arm or hand becomes pale, cool, tingly, or numb. These symptoms may represent a serious problem that is an emergency. Do not wait to see if the symptoms will go away. Get medical help right away. Call your local emergency services (911 in the [place] Do not drive yourself to the hospital. Summary After the procedure, it is common to have bruising and tenderness at the incision site. Follow instructions from your health care provider about how to take care of your radial site incision. Check the incision every day for signs of infection. Do not  lift anything that is heavier than 10 lb (4.5 kg), or the limit that you are told, until your health care provider says that it is safe. Get help right away if the incision area swells very fast, you have bleeding at the incision site that will not stop, or your arm or hand becomes pale, cool, or numb. This information is not intended to replace advice given to you by your health care provider. Make sure you discuss any questions you have with your health care provider. Document Revised: 08/17/2020  Document Reviewed: 08/17/2020 Elsevier Patient Education  [employer].

## 2022-03-26 NOTE — Progress Notes (Signed)
TR BAND REMOVAL  LOCATION:    right radial  DEFLATED PER PROTOCOL:    Yes.    TIME BAND OFF / DRESSING APPLIED:    1035 gauze dressing applied   SITE UPON ARRIVAL:    Level 0  SITE AFTER BAND REMOVAL:    Level 0  CIRCULATION SENSATION AND MOVEMENT:    Within Normal Limits   Yes.    COMMENTS:   no issues

## 2022-03-30 ENCOUNTER — Telehealth: Payer: Self-pay | Admitting: Internal Medicine

## 2022-03-30 NOTE — Telephone Encounter (Signed)
  Pt said, he had his heart cath last Friday and he is feeling pain in his right elbow, pt said, its very painful and wanted to know if this normal

## 2022-03-30 NOTE — Telephone Encounter (Signed)
Insdie of right elbow, where bicep starts it is really painful with muscle contraction or when he presses on it.  Sensitive to touch and any pressure.  This started Sunday.  Right wrist insertion site is just a small scabbed area, slightly tender to touch, no bruising, normal temp.    At inside of elbow there had been an IV.  There is no redness, swelling, bruising, warmth or coolness looks normal, just very tender/sensitive.  Using arm but following lifting restrictions.  I adv to monitor this and to call back with any changes as discussed above, or if it does not improve.  The patient voices understanding and agreement.

## 2022-04-06 ENCOUNTER — Ambulatory Visit (HOSPITAL_COMMUNITY)
Admission: RE | Admit: 2022-04-06 | Discharge: 2022-04-06 | Disposition: A | Discharge status: Home / Self Care | Payer: Medicare PPO | Source: Ambulatory Visit | Attending: Cardiovascular Disease | Admitting: Cardiovascular Disease

## 2022-04-06 DIAGNOSIS — Z9889 Other specified postprocedural states: Secondary | ICD-10-CM | POA: Diagnosis not present

## 2022-04-06 DIAGNOSIS — I6522 Occlusion and stenosis of left carotid artery: Secondary | ICD-10-CM | POA: Insufficient documentation

## 2022-04-08 ENCOUNTER — Ambulatory Visit (HOSPITAL_COMMUNITY)
Admission: RE | Admit: 2022-04-08 | Discharge: 2022-04-08 | Disposition: A | Discharge status: Home / Self Care | Payer: Medicare PPO | Source: Ambulatory Visit | Attending: Physician Assistant | Admitting: Physician Assistant

## 2022-04-08 DIAGNOSIS — K449 Diaphragmatic hernia without obstruction or gangrene: Secondary | ICD-10-CM | POA: Diagnosis not present

## 2022-04-08 DIAGNOSIS — Z01818 Encounter for other preprocedural examination: Secondary | ICD-10-CM | POA: Diagnosis not present

## 2022-04-08 DIAGNOSIS — I350 Nonrheumatic aortic (valve) stenosis: Secondary | ICD-10-CM | POA: Insufficient documentation

## 2022-04-08 DIAGNOSIS — R911 Solitary pulmonary nodule: Secondary | ICD-10-CM | POA: Diagnosis not present

## 2022-04-08 MED ORDER — IOHEXOL 350 MG/ML SOLN
100.0000 mL | Freq: Once | INTRAVENOUS | Status: AC | PRN
  Administered 2022-04-08: 100 mL via INTRAVENOUS

## 2022-04-12 ENCOUNTER — Encounter: Payer: Self-pay | Admitting: Internal Medicine

## 2022-04-29 ENCOUNTER — Institutional Professional Consult (permissible substitution): Payer: Medicare PPO | Admitting: Cardiothoracic Surgery

## 2022-04-29 ENCOUNTER — Encounter: Payer: Self-pay | Admitting: Cardiothoracic Surgery

## 2022-04-29 VITALS — BP 124/87 | HR 40 | Resp 20 | Ht 66.0 in | Wt 164.1 lb

## 2022-04-29 DIAGNOSIS — I350 Nonrheumatic aortic (valve) stenosis: Secondary | ICD-10-CM | POA: Diagnosis not present

## 2022-05-03 ENCOUNTER — Encounter: Payer: Self-pay | Admitting: Cardiology

## 2022-05-03 ENCOUNTER — Other Ambulatory Visit: Payer: Self-pay

## 2022-05-03 DIAGNOSIS — I350 Nonrheumatic aortic (valve) stenosis: Secondary | ICD-10-CM

## 2022-05-03 DIAGNOSIS — Q231 Congenital insufficiency of aortic valve: Secondary | ICD-10-CM

## 2022-05-07 ENCOUNTER — Ambulatory Visit (HOSPITAL_COMMUNITY)
Admission: RE | Admit: 2022-05-07 | Discharge: 2022-05-07 | Disposition: A | Discharge status: Home / Self Care | Payer: Medicare PPO | Source: Ambulatory Visit | Attending: Internal Medicine | Admitting: Internal Medicine

## 2022-05-07 ENCOUNTER — Encounter (HOSPITAL_COMMUNITY)
Admission: RE | Admit: 2022-05-07 | Discharge: 2022-05-07 | Disposition: A | Discharge status: Home / Self Care | Payer: Medicare PPO | Source: Ambulatory Visit | Attending: Internal Medicine | Admitting: Internal Medicine

## 2022-05-07 DIAGNOSIS — Z1152 Encounter for screening for COVID-19: Secondary | ICD-10-CM | POA: Insufficient documentation

## 2022-05-07 DIAGNOSIS — I350 Nonrheumatic aortic (valve) stenosis: Secondary | ICD-10-CM | POA: Insufficient documentation

## 2022-05-07 DIAGNOSIS — I4891 Unspecified atrial fibrillation: Secondary | ICD-10-CM | POA: Diagnosis not present

## 2022-05-07 DIAGNOSIS — Z01818 Encounter for other preprocedural examination: Secondary | ICD-10-CM | POA: Insufficient documentation

## 2022-05-07 DIAGNOSIS — Q231 Congenital insufficiency of aortic valve: Secondary | ICD-10-CM

## 2022-05-07 LAB — COMPREHENSIVE METABOLIC PANEL
ALT: 16 U/L (ref 0–44)
AST: 26 U/L (ref 15–41)
Albumin: 4.1 g/dL (ref 3.5–5.0)
Alkaline Phosphatase: 72 U/L (ref 38–126)
Anion gap: 6 (ref 5–15)
BUN: 11 mg/dL (ref 8–23)
CO2: 26 mmol/L (ref 22–32)
Calcium: 9.3 mg/dL (ref 8.9–10.3)
Chloride: 107 mmol/L (ref 98–111)
Creatinine, Ser: 1.1 mg/dL (ref 0.61–1.24)
GFR, Estimated: >60 mL/min (ref >60)
Glucose, Bld: 96 mg/dL (ref 70–99)
Potassium: 3.9 mmol/L (ref 3.5–5.1)
Sodium: 139 mmol/L (ref 135–145)
Total Bilirubin: 0.8 mg/dL (ref 0.3–1.2)
Total Protein: 7.2 g/dL (ref 6.5–8.1)

## 2022-05-07 LAB — CBC
HCT: 39.6 % (ref 39.0–52.0)
Hemoglobin: 13 g/dL (ref 13.0–17.0)
MCH: 26.3 pg (ref 26.0–34.0)
MCHC: 32.8 g/dL (ref 30.0–36.0)
MCV: 80 fL (ref 80.0–100.0)
Platelets: 124 10*3/uL — ABNORMAL LOW (ref 150–400)
RBC: 4.95 MIL/uL (ref 4.22–5.81)
RDW: 14.6 % (ref 11.5–15.5)
WBC: 5.9 10*3/uL (ref 4.0–10.5)
nRBC: 0 % (ref 0.0–0.2)

## 2022-05-07 LAB — TYPE AND SCREEN
ABO/RH(D): O NEG
Antibody Screen: NEGATIVE

## 2022-05-07 LAB — URINALYSIS, ROUTINE W REFLEX MICROSCOPIC
Bacteria, UA: NONE SEEN
Bilirubin Urine: NEGATIVE
Glucose, UA: NEGATIVE mg/dL
Ketones, ur: NEGATIVE mg/dL
Leukocytes,Ua: NEGATIVE
Nitrite: NEGATIVE
Protein, ur: NEGATIVE mg/dL
Specific Gravity, Urine: 1.008 (ref 1.005–1.030)
pH: 6 (ref 5.0–8.0)

## 2022-05-07 LAB — SURGICAL PCR SCREEN
MRSA, PCR: NEGATIVE
Staphylococcus aureus: NEGATIVE

## 2022-05-07 LAB — SARS CORONAVIRUS 2 (TAT 6-24 HRS): SARS Coronavirus 2: NEGATIVE

## 2022-05-07 LAB — PROTIME-INR
INR: 1.4 — ABNORMAL HIGH (ref 0.8–1.2)
Prothrombin Time: 16.5 seconds — ABNORMAL HIGH (ref 11.4–15.2)

## 2022-05-07 NOTE — Progress Notes (Signed)
Pt seen in PAT for labs, EKG, CXR and Covid testing. Informed consent obtained for procedure. All concerns addressed during this visit.   Jacqlyn Larsen, RN

## 2022-05-09 NOTE — Progress Notes (Signed)
[address]       [place] [address]             [phone]        Brian Klein [facility] Medical Record #[id number] Date of Birth: Sep 25, 1945  Referring: Jerline Pain, MD Primary Care: Crist Infante, MD Primary Cardiologist:Mark Marlou Porch, MD  Chief Complaint:    Chief Complaint  Patient presents with   Aortic Stenosis    Surgical consult for TAVR, review all testing    History of Present Illness:     76 year old male here regarding evaluation for transcatheter aortic valve. He has shortness of breath with moderate activity. Denies angina or syncope. Good dental health. On xarelto for AF and also has HTN and HL.  As for his AS, Paradoxical low-flow low gradient severe bicuspid aortic stenosis with an aortic valve area of 0.67 cm, dimensionless index of 0.23, mean gradient of 21 mmHg, stroke-volume index of less than 35, and ejection fraction of 55 to 60%; EKG with atrial fibrillation with no bundle-branch blocks  Past Medical History:  Diagnosis Date   Abnormal CT scan 11/26/2015   Atrial fibrillation (HCC)    AVD (aortic valve disease)    with bicupsid aortic valve   Chronic anticoagulation 11/26/2015   Erectile dysfunction 01/10/2013   Hypercholesterolemia 12/16/2010   Hyperlipidemia    Hypertension    Mild aortic stenosis    MVP (mitral valve prolapse)    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   PAF (paroxysmal atrial fibrillation) ([facility]) 11/26/2015   Seasonal allergies 11/19/2014    Past Surgical History:  Procedure Laterality Date   EPIGASTRIC HERNIA REPAIR N/A 05/19/2020   Procedure: OPEN EPIGASTRIC HERNIA REPAIR WITH MESH;  Surgeon: Kinsinger, Arta Bruce, MD;  Location: [facility];  Service: General;  Laterality: N/A;   EYELID LACERATION REPAIR  2008   left and right   [place]   right removal -- cancer   MANDIBLE SURGERY  1987   right reconstruction   RIGHT/LEFT HEART CATH AND CORONARY ANGIOGRAPHY N/A 03/26/2022    Procedure: RIGHT/LEFT HEART CATH AND CORONARY ANGIOGRAPHY;  Surgeon: Early Osmond, MD;  Location: [facility];  Service: Cardiovascular;  Laterality: N/A;   TRANSTHORACIC ECHOCARDIOGRAM  07/18/2002   ef 71%    Social History   Tobacco Use  Smoking Status Never  Smokeless Tobacco Never    Social History   Substance and Sexual Activity  Alcohol Use No     Allergies  Allergen Reactions   Crestor [Rosuvastatin]     Legs aches and pain     Current Outpatient Medications  Medication Sig Dispense Refill   amLODipine (NORVASC) 5 MG tablet Take 5 mg by mouth daily.      amoxicillin (AMOXIL) 500 MG capsule TAKE 4 CAPSULES BY MOUTH 1 HOUR BEFORE DENTAL APPOINTMENT 4 capsule 3   BELSOMRA 20 MG TABS Take 20 mg by mouth at bedtime.     Cholecalciferol (VITAMIN D) 2000 UNITS tablet Take 2,000 Units by mouth daily.     Coenzyme Q10 (CO Q 10) 100 MG CAPS Take 100 mg by mouth daily.     ezetimibe-simvastatin (VYTORIN) 10-40 MG per tablet Take 1 tablet by mouth at bedtime.     fish oil-omega-3 fatty acids 1000 MG capsule Take 1 g by mouth daily.     fluticasone (FLONASE) 50 MCG/ACT nasal spray Place 1 spray into both nostrils  daily as needed for allergies or rhinitis.      metoprolol succinate (TOPROL-XL) 25 MG 24 hr tablet TAKE 1/2 TABLET(12.5 MG) BY MOUTH DAILY 45 tablet 1   metroNIDAZOLE (METROGEL) 0.75 % gel Apply 1 Application topically 2 (two) times daily as needed (rosacea).     olmesartan (BENICAR) 20 MG tablet Take 20 mg by mouth daily.     rivaroxaban (XARELTO) 20 MG TABS tablet TAKE 1 TABLET(20 MG) BY MOUTH DAILY 90 tablet 0   No current facility-administered medications for this visit.    (Not in a hospital admission)   Family History  Problem Relation Age of Onset   Stroke Mother    Heart attack Father    Hypertension Father    Hypertension Sister    Colon cancer Neg Hx    Stomach cancer Neg Hx    Rectal cancer Neg Hx    Esophageal cancer Neg Hx       Review of Systems:   ROS14 point neg except as per hpi  Physical Exam: BP 124/87 (BP Location: Right Arm, Patient Position: Sitting, Cuff Size: Normal)   Pulse (!) 40   Resp 20   Ht '5\' 6"'$  (1.676 m)   Wt 164 lb 1.8 oz (74.4 kg)   SpO2 95% Comment: RA  BMI 26.49 kg/m   NAD  Alert Nonlaboured resp Abd soft nd Extr wwp  Diagnostic Studies & Laboratory data:     Recent Radiology Findings:   No results found.   I have independently reviewed the above radiologic studies and discussed with the patient   Recent Lab Findings: Lab Results  Component Value Date   WBC 5.9 05/07/2022   HGB 13.0 05/07/2022   HCT 39.6 05/07/2022   PLT 124 (L) 05/07/2022   GLUCOSE 96 05/07/2022   CHOL 113 04/22/2020   TRIG 80 04/22/2020   HDL 38 (L) 04/22/2020   LDLCALC 59 04/22/2020   ALT 16 05/07/2022   AST 26 05/07/2022   NA 139 05/07/2022   K 3.9 05/07/2022   CL 107 05/07/2022   CREATININE 1.10 05/07/2022   BUN 11 05/07/2022   CO2 26 05/07/2022   TSH 2.28 12/17/2011   INR 1.4 (H) 05/07/2022   EXAM: Cardiac TAVR CT   TECHNIQUE: The patient was scanned on a Siemens Force 315 slice scanner. A 120 kV retrospective scan was triggered in the ascending thoracic aorta at 140 HU's. Gantry rotation speed was 250 msecs and collimation was .6 mm. No beta blockade or nitro were given. The 3D data set was reconstructed in 5% intervals of the R-R cycle. Systolic and diastolic phases were analyzed on a dedicated work station using MPR, MIP and VRT modes. The patient received 80 cc of contrast.   FINDINGS: Aortic Valve: Tri leaflet AV with calcium score 2436   Aorta: No aneurysm normal arch vessels mild calcific atherosclerosis   Sino-tubular Junction: 30 mm   Ascending Thoracic Aorta: 35 mm   Aortic Arch: 24 mm   Descending Thoracic Aorta: 23 mm   Sinus of Valsalva Measurements:   Non-coronary: 36.4 mm   Right - coronary: 33.4 mm   Left -   coronary: 33.9 mm   Coronary  Artery Height above Annulus:   Left Main: 17.1 mm above annulus   Right Coronary: 17.1 mm above annulus   Virtual Basal Annulus Measurements:   Maximum / Minimum Diameter: 28.8 mm x 22.2 mm   Perimeter: 81.1 mm   Area: 500 mm 2  Coronary Arteries: Sufficient height above annulus for deployment   Optimum Fluoroscopic Angle for Delivery: LAO 10 Caudal 17 degrees   IMPRESSION: 1.  Tri leaflet AV with calcium score 2436   2. Annular area of 500 mm2 suitable for a 26 mm Sapien 3 Ultra valve   3.  Coronary arteries sufficient height above annulus for deployment   4. Optimum angiographic angle for deployment LAO 10 Caudal 17 degrees   5.  Mixing artifact in LAA cannot r/o thrombus   6.  Membranous septal length 9 mm   Jenkins Rouge   Electronically Signed: By: Jenkins Rouge M.D. On: 04/08/2022 11:57  CLINICAL DATA:  Preop evaluation for aortic valve replacement   EXAM: CT ANGIOGRAPHY CHEST, ABDOMEN AND PELVIS   TECHNIQUE: Non-contrast CT of the chest was initially obtained.   Multidetector CT imaging through the chest, abdomen and pelvis was performed using the standard protocol during bolus administration of intravenous contrast. Multiplanar reconstructed images and MIPs were obtained and reviewed to evaluate the vascular anatomy.   RADIATION DOSE REDUCTION: This exam was performed according to the departmental dose-optimization program which includes automated exposure control, adjustment of the mA and/or kV according to patient size and/or use of iterative reconstruction technique.   CONTRAST:  140m OMNIPAQUE IOHEXOL 350 MG/ML SOLN   COMPARISON:  CT abdomen and pelvis dated Nov 13, 2015   FINDINGS: CTA CHEST FINDINGS   Cardiovascular: Normal heart size. No pericardial effusion. Normal caliber thoracic aorta with mild atherosclerotic disease. Aortic valve thickening and calcifications. Coronary artery calcifications of the LAD and RCA. No suspicious  filling defects of the central pulmonary arteries.   Mediastinum/Nodes: Small hiatal hernia. Bilateral thyroid nodules, previously evaluated with thyroid ultrasound. No pathologically enlarged lymph nodes seen in the chest.   Lungs/Pleura: Central airways are patent. No consolidation, pleural effusion or pneumothorax.   Musculoskeletal: Chronic appearing deformities several left lateral ribs. No aggressive soft tissue components. No acute osseous findings.   CTA ABDOMEN AND PELVIS FINDINGS   Hepatobiliary: No focal liver abnormality is seen. No gallstones, gallbladder wall thickening, or biliary dilatation.   Pancreas: Unremarkable. No pancreatic ductal dilatation or surrounding inflammatory changes.   Spleen: Normal in size without focal abnormality.   Adrenals/Urinary Tract: Bilateral adrenal glands are unremarkable. Simple appearing cyst of the left kidney and lesion of the anterior kidney which is too small to completely characterize, no specific follow-up imaging is recommended. Bladder is thick-walled, similar to prior exam.   Stomach/Bowel: Stomach is within normal limits. Appendix appears normal. No evidence of bowel wall thickening, distention, or inflammatory changes.   Vascular/lymphatic: No pathologically enlarged lymph nodes seen in the chest. Normal caliber abdominal aorta with moderate calcified and noncalcified plaque moderate narrowing seen at the origins of the bilateral renal arteries due to calcified and noncalcified plaque.   Reproductive: Prostatomegaly   Other: Moderate left fat containing inguinal hernia. Abdominopelvic ascites.   Musculoskeletal: L4-L5 anterolisthesis with chronic bilateral pars defects. Aggressive appearing osseous lesions.   VASCULAR MEASUREMENTS PERTINENT TO TAVR:   AORTA:   Minimal Aortic Diameter-13.6 mm   Severity of Aortic Calcification-moderate   RIGHT PELVIS:   Right Common Iliac Artery -   Minimal  Diameter-10.8 mm   Tortuosity-severe   Calcification-moderate   Right External Iliac Artery -   Minimal Diameter-10.7 mm   Tortuosity-severe   Calcification-none   Right Common Femoral Artery -   Minimal Diameter-10.3 mm   Tortuosity-none   Calcification-mild   LEFT PELVIS:   Left  Common Iliac Artery -   Minimal Diameter-8.8 mm   Tortuosity-severe   Calcification-moderate   Left External Iliac Artery -   Minimal Diameter-10.1 mm   Tortuosity-severe   Calcification-none   Left Common Femoral Artery -   Minimal Diameter-9.2 mm   Tortuosity-none   Calcification-mild   Review of the MIP images confirms the above findings.   IMPRESSION: 1. Vascular findings and measurements pertinent to potential TAVR procedure, as detailed above. 2. Thickening and calcification of the aortic valve, compatible with reported clinical history of aortic stenosis. 3. Moderate aortoiliac atherosclerosis. Coronary artery calcifications of the LAD and RCA. 4. Bladder wall thickening and prostatomegaly, findings are likely due to chronic outlet obstruction.     Assessment / Plan:     16M presents with symptomatic severe aortic stenosis. Meets criteria for AV replacement. STS risk 1.8%.  NYHA class: II  Patient a candidate for SAVR or TAVR, he elects for TAVR.  Bailout: yes Valve: 52 Edwards S3 Access: transfemoral, does have tortuoseity Dental: no issues Misc Procedural: had what appeared to be mixing artifact in LAA

## 2022-05-10 MED ORDER — CEFAZOLIN SODIUM-DEXTROSE 2-4 GM/100ML-% IV SOLN
2.0000 g | INTRAVENOUS | Status: AC
  Administered 2022-05-11: 2 g via INTRAVENOUS
  Filled 2022-05-10: qty 100

## 2022-05-10 MED ORDER — HEPARIN 30,000 UNITS/1000 ML (OHS) CELLSAVER SOLUTION
Status: DC
  Filled 2022-05-10 (×2): qty 1000

## 2022-05-10 MED ORDER — MAGNESIUM SULFATE 50 % IJ SOLN
40.0000 meq | INTRAMUSCULAR | Status: DC
  Filled 2022-05-10 (×2): qty 9.85

## 2022-05-10 MED ORDER — POTASSIUM CHLORIDE 2 MEQ/ML IV SOLN
80.0000 meq | INTRAVENOUS | Status: DC
  Filled 2022-05-10 (×2): qty 40

## 2022-05-10 MED ORDER — NOREPINEPHRINE 4 MG/250ML-% IV SOLN
0.0000 ug/min | INTRAVENOUS | Status: AC
  Administered 2022-05-11: 2 ug/min via INTRAVENOUS
  Filled 2022-05-10: qty 250

## 2022-05-10 MED ORDER — DEXMEDETOMIDINE HCL IN NACL 400 MCG/100ML IV SOLN
0.1000 ug/kg/h | INTRAVENOUS | Status: AC
  Administered 2022-05-11: 72.6 ug via INTRAVENOUS
  Filled 2022-05-10 (×2): qty 100

## 2022-05-10 NOTE — Anesthesia Preprocedure Evaluation (Addendum)
Anesthesia Evaluation  Patient identified by MRN, date of birth, ID band Patient awake    Reviewed: Allergy & Precautions, NPO status , Patient's Chart, lab work & pertinent test results  Airway Mallampati: II  TM Distance: >3 FB Neck ROM: Full    Dental no notable dental hx.    Pulmonary neg pulmonary ROS,    Pulmonary exam normal        Cardiovascular hypertension, Pt. on medications and Pt. on home beta blockers + dysrhythmias Atrial Fibrillation + Valvular Problems/Murmurs AS  Rhythm:Regular Rate:Normal + Systolic murmurs ECHO 08/23: 1. Left ventricular ejection fraction, by estimation, is 55 to 60%. The  left ventricle has normal function. The left ventricle has no regional  wall motion abnormalities. There is mild concentric left ventricular  hypertrophy. Left ventricular diastolic  parameters are indeterminate.  2. Right ventricular systolic function is low normal. The right  ventricular size is normal.  3. Left atrial size was severely dilated.  4. Right atrial size was moderately dilated.  5. Mild mitral valve regurgitation.  6. AV is thickened, calcified Difficult to see individual leaflets well  due to calcification. Peak and mean gradients thorugh the valve are 36 and  22 mm Hg respectively AVA (VTI) is 0.67 cm2 Dimensionless index is 0.23  Overall consistent with low  gradient severe AS. Note that gradient is increased from echo report from  2021 (mean gradient 18 to 22 mm Hg) Consider other imaging modality to  define further . Marland Kitchen The aortic valve is abnormal. Aortic valve  regurgitation is mild.  7. The inferior vena cava is normal in size with greater than 50%  respiratory variability, suggesting right atrial pressure of 3 mmHg.    Neuro/Psych negative neurological ROS  negative psych ROS   GI/Hepatic negative GI ROS, Neg liver ROS,   Endo/Other  negative endocrine ROS  Renal/GU negative Renal ROS   negative genitourinary   Musculoskeletal negative musculoskeletal ROS (+)   Abdominal Normal abdominal exam  (+)   Peds  Hematology negative hematology ROS (+)   Anesthesia Other Findings   Reproductive/Obstetrics                            Anesthesia Physical Anesthesia Plan  ASA: 4  Anesthesia Plan: MAC   Post-op Pain Management:    Induction: Intravenous  PONV Risk Score and Plan: 1 and Ondansetron, Dexamethasone, Propofol infusion and Treatment may vary due to age or medical condition  Airway Management Planned: Mask and Oral ETT  Additional Equipment: Arterial line  Intra-op Plan:   Post-operative Plan:   Informed Consent: I have reviewed the patients History and Physical, chart, labs and discussed the procedure including the risks, benefits and alternatives for the proposed anesthesia with the patient or authorized representative who has indicated his/her understanding and acceptance.     Dental advisory given  Plan Discussed with: CRNA  Anesthesia Plan Comments: (Lab Results      Component                Value               Date                      WBC                      5.9  05/07/2022                HGB                      13.0                05/07/2022                HCT                      39.6                05/07/2022                MCV                      80.0                05/07/2022                PLT                      124 (L)             05/07/2022           Lab Results      Component                Value               Date                      NA                       139                 05/07/2022                K                        3.9                 05/07/2022                CO2                      26                  05/07/2022                GLUCOSE                  96                  05/07/2022                BUN                      11                  05/07/2022                 CREATININE               1.10                05/07/2022  CALCIUM                  9.3                 05/07/2022                EGFR                     85                  03/17/2022                GFRNONAA                 >60                 05/07/2022          )       Anesthesia Quick Evaluation

## 2022-05-11 ENCOUNTER — Other Ambulatory Visit: Payer: Self-pay | Admitting: Physician Assistant

## 2022-05-11 ENCOUNTER — Encounter (HOSPITAL_COMMUNITY): Payer: Self-pay | Admitting: Internal Medicine

## 2022-05-11 ENCOUNTER — Inpatient Hospital Stay (HOSPITAL_COMMUNITY): Payer: Medicare PPO | Admitting: Anesthesiology

## 2022-05-11 ENCOUNTER — Other Ambulatory Visit (HOSPITAL_COMMUNITY): Payer: Self-pay

## 2022-05-11 ENCOUNTER — Other Ambulatory Visit: Payer: Self-pay

## 2022-05-11 ENCOUNTER — Inpatient Hospital Stay (HOSPITAL_COMMUNITY)
Admission: RE | Admit: 2022-05-11 | Discharge: 2022-05-12 | DRG: 267 | Disposition: A | Discharge status: Home / Self Care | Payer: Medicare PPO | Attending: Internal Medicine | Admitting: Internal Medicine

## 2022-05-11 ENCOUNTER — Telehealth (HOSPITAL_COMMUNITY): Payer: Self-pay

## 2022-05-11 ENCOUNTER — Inpatient Hospital Stay (HOSPITAL_COMMUNITY): Payer: Medicare PPO

## 2022-05-11 ENCOUNTER — Encounter (HOSPITAL_COMMUNITY)
Admission: RE | Disposition: A | Discharge status: Home / Self Care | Payer: Self-pay | Source: Home / Self Care | Attending: Internal Medicine

## 2022-05-11 DIAGNOSIS — Z79899 Other long term (current) drug therapy: Secondary | ICD-10-CM

## 2022-05-11 DIAGNOSIS — Z952 Presence of prosthetic heart valve: Secondary | ICD-10-CM

## 2022-05-11 DIAGNOSIS — E7800 Pure hypercholesterolemia, unspecified: Secondary | ICD-10-CM | POA: Diagnosis not present

## 2022-05-11 DIAGNOSIS — I4821 Permanent atrial fibrillation: Secondary | ICD-10-CM | POA: Diagnosis present

## 2022-05-11 DIAGNOSIS — Z006 Encounter for examination for normal comparison and control in clinical research program: Secondary | ICD-10-CM | POA: Diagnosis not present

## 2022-05-11 DIAGNOSIS — Z7901 Long term (current) use of anticoagulants: Secondary | ICD-10-CM

## 2022-05-11 DIAGNOSIS — I350 Nonrheumatic aortic (valve) stenosis: Secondary | ICD-10-CM

## 2022-05-11 DIAGNOSIS — Z823 Family history of stroke: Secondary | ICD-10-CM

## 2022-05-11 DIAGNOSIS — Q231 Congenital insufficiency of aortic valve: Secondary | ICD-10-CM

## 2022-05-11 DIAGNOSIS — I708 Atherosclerosis of other arteries: Secondary | ICD-10-CM | POA: Diagnosis not present

## 2022-05-11 DIAGNOSIS — Z888 Allergy status to other drugs, medicaments and biological substances status: Secondary | ICD-10-CM | POA: Diagnosis not present

## 2022-05-11 DIAGNOSIS — I10 Essential (primary) hypertension: Secondary | ICD-10-CM | POA: Diagnosis not present

## 2022-05-11 DIAGNOSIS — Z8249 Family history of ischemic heart disease and other diseases of the circulatory system: Secondary | ICD-10-CM | POA: Diagnosis not present

## 2022-05-11 DIAGNOSIS — Z85830 Personal history of malignant neoplasm of bone: Secondary | ICD-10-CM | POA: Diagnosis not present

## 2022-05-11 DIAGNOSIS — E785 Hyperlipidemia, unspecified: Secondary | ICD-10-CM

## 2022-05-11 DIAGNOSIS — I4891 Unspecified atrial fibrillation: Secondary | ICD-10-CM | POA: Diagnosis not present

## 2022-05-11 HISTORY — PX: INTRAOPERATIVE TRANSTHORACIC ECHOCARDIOGRAM: SHX6523

## 2022-05-11 HISTORY — PX: TRANSCATHETER AORTIC VALVE REPLACEMENT, TRANSFEMORAL: SHX6400

## 2022-05-11 HISTORY — DX: Presence of prosthetic heart valve: Z95.2

## 2022-05-11 LAB — ECHOCARDIOGRAM LIMITED
AR max vel: 4.48 cm2
AV Area VTI: 4.15 cm2
AV Area mean vel: 3.9 cm2
AV Mean grad: 3 mmHg
AV Peak grad: 4.8 mmHg
Ao pk vel: 1.1 m/s
Single Plane A2C EF: 51.3 %

## 2022-05-11 LAB — POCT I-STAT 7, (LYTES, BLD GAS, ICA,H+H)
Acid-base deficit: 5 mmol/L — ABNORMAL HIGH (ref 0.0–2.0)
Bicarbonate: 21.9 mmol/L (ref 20.0–28.0)
Calcium, Ion: 1.3 mmol/L (ref 1.15–1.40)
HCT: 36 % — ABNORMAL LOW (ref 39.0–52.0)
Hemoglobin: 12.2 g/dL — ABNORMAL LOW (ref 13.0–17.0)
O2 Saturation: 99 %
Potassium: 3.9 mmol/L (ref 3.5–5.1)
Sodium: 140 mmol/L (ref 135–145)
TCO2: 23 mmol/L (ref 22–32)
pCO2 arterial: 45.3 mmHg (ref 32–48)
pH, Arterial: 7.291 — ABNORMAL LOW (ref 7.35–7.45)
pO2, Arterial: 185 mmHg — ABNORMAL HIGH (ref 83–108)

## 2022-05-11 LAB — POCT I-STAT, CHEM 8
BUN: 11 mg/dL (ref 8–23)
BUN: 11 mg/dL (ref 8–23)
Calcium, Ion: 1.17 mmol/L (ref 1.15–1.40)
Calcium, Ion: 1.25 mmol/L (ref 1.15–1.40)
Chloride: 107 mmol/L (ref 98–111)
Chloride: 108 mmol/L (ref 98–111)
Creatinine, Ser: 0.9 mg/dL (ref 0.61–1.24)
Creatinine, Ser: 0.9 mg/dL (ref 0.61–1.24)
Glucose, Bld: 123 mg/dL — ABNORMAL HIGH (ref 70–99)
Glucose, Bld: 114 mg/dL — ABNORMAL HIGH (ref 70–99)
HCT: 40 % (ref 39.0–52.0)
HCT: 35 % — ABNORMAL LOW (ref 39.0–52.0)
Hemoglobin: 13.6 g/dL (ref 13.0–17.0)
Hemoglobin: 11.9 g/dL — ABNORMAL LOW (ref 13.0–17.0)
Potassium: 3.9 mmol/L (ref 3.5–5.1)
Potassium: 4.3 mmol/L (ref 3.5–5.1)
Sodium: 141 mmol/L (ref 135–145)
Sodium: 140 mmol/L (ref 135–145)
TCO2: 20 mmol/L — ABNORMAL LOW (ref 22–32)
TCO2: 22 mmol/L (ref 22–32)

## 2022-05-11 LAB — ABO/RH: ABO/RH(D): O NEG

## 2022-05-11 SURGERY — IMPLANTATION, AORTIC VALVE, TRANSCATHETER, FEMORAL APPROACH
Anesthesia: Monitor Anesthesia Care

## 2022-05-11 MED ORDER — CEFAZOLIN SODIUM-DEXTROSE 2-4 GM/100ML-% IV SOLN
2.0000 g | Freq: Three times a day (TID) | INTRAVENOUS | Status: AC
  Administered 2022-05-11 – 2022-05-12 (×2): 2 g via INTRAVENOUS
  Filled 2022-05-11 (×2): qty 100

## 2022-05-11 MED ORDER — IOHEXOL 350 MG/ML SOLN
INTRAVENOUS | Status: DC | PRN
  Administered 2022-05-11: 90 mL

## 2022-05-11 MED ORDER — MORPHINE SULFATE (PF) 2 MG/ML IV SOLN: 1.0000 mg | INTRAVENOUS | Status: DC | PRN

## 2022-05-11 MED ORDER — SODIUM CHLORIDE 0.9 % IV SOLN: INTRAVENOUS | Status: DC

## 2022-05-11 MED ORDER — NITROGLYCERIN IN D5W 200-5 MCG/ML-% IV SOLN: 0.0000 ug/min | INTRAVENOUS | Status: DC

## 2022-05-11 MED ORDER — HEPARIN (PORCINE) IN NACL 1000-0.9 UT/500ML-% IV SOLN
INTRAVENOUS | Status: AC
  Filled 2022-05-11: qty 500

## 2022-05-11 MED ORDER — PHENYLEPHRINE 80 MCG/ML (10ML) SYRINGE FOR IV PUSH (FOR BLOOD PRESSURE SUPPORT)
PREFILLED_SYRINGE | INTRAVENOUS | Status: DC | PRN
  Administered 2022-05-11: 240 ug via INTRAVENOUS
  Administered 2022-05-11 (×3): 80 ug via INTRAVENOUS

## 2022-05-11 MED ORDER — FLUTICASONE PROPIONATE 50 MCG/ACT NA SUSP: 1.0000 | Freq: Every day | NASAL | Status: DC | PRN

## 2022-05-11 MED ORDER — ONDANSETRON HCL 4 MG/2ML IJ SOLN: 4.0000 mg | Freq: Four times a day (QID) | INTRAMUSCULAR | Status: DC | PRN

## 2022-05-11 MED ORDER — SIMVASTATIN 20 MG PO TABS
40.0000 mg | ORAL_TABLET | Freq: Every day | ORAL | Status: DC
  Administered 2022-05-11: 40 mg via ORAL
  Filled 2022-05-11: qty 2

## 2022-05-11 MED ORDER — SODIUM CHLORIDE 0.9% FLUSH: 3.0000 mL | INTRAVENOUS | Status: DC | PRN

## 2022-05-11 MED ORDER — APIXABAN 5 MG PO TABS
5.0000 mg | ORAL_TABLET | Freq: Two times a day (BID) | ORAL | Status: DC
  Filled 2022-05-11: qty 1

## 2022-05-11 MED ORDER — CHLORHEXIDINE GLUCONATE 4 % EX LIQD
30.0000 mL | CUTANEOUS | Status: DC
  Filled 2022-05-11: qty 30

## 2022-05-11 MED ORDER — CHLORHEXIDINE GLUCONATE 4 % EX LIQD
60.0000 mL | Freq: Once | CUTANEOUS | Status: DC
  Filled 2022-05-11: qty 60

## 2022-05-11 MED ORDER — IRBESARTAN 150 MG PO TABS
150.0000 mg | ORAL_TABLET | Freq: Every day | ORAL | Status: DC
  Administered 2022-05-11 – 2022-05-12 (×2): 150 mg via ORAL
  Filled 2022-05-11 (×2): qty 1

## 2022-05-11 MED ORDER — SODIUM CHLORIDE 0.9 % IV SOLN: INTRAVENOUS | Status: AC

## 2022-05-11 MED ORDER — SODIUM CHLORIDE 0.9% FLUSH
3.0000 mL | Freq: Two times a day (BID) | INTRAVENOUS | Status: DC
  Administered 2022-05-11: 3 mL via INTRAVENOUS

## 2022-05-11 MED ORDER — LIDOCAINE HCL (PF) 1 % IJ SOLN
INTRAMUSCULAR | Status: DC | PRN
  Administered 2022-05-11 (×2): 10 mL

## 2022-05-11 MED ORDER — LIDOCAINE HCL (PF) 1 % IJ SOLN
INTRAMUSCULAR | Status: AC
  Filled 2022-05-11: qty 30

## 2022-05-11 MED ORDER — CHLORHEXIDINE GLUCONATE 0.12 % MT SOLN
15.0000 mL | Freq: Once | OROMUCOSAL | Status: AC
  Administered 2022-05-11 – 2022-05-12 (×2): 15 mL via OROMUCOSAL
  Filled 2022-05-11 (×2): qty 15

## 2022-05-11 MED ORDER — LACTATED RINGERS IV SOLN: INTRAVENOUS | Status: DC | PRN

## 2022-05-11 MED ORDER — EZETIMIBE 10 MG PO TABS
10.0000 mg | ORAL_TABLET | Freq: Every day | ORAL | Status: DC
  Administered 2022-05-11: 10 mg via ORAL
  Filled 2022-05-11: qty 1

## 2022-05-11 MED ORDER — NITROGLYCERIN 0.2 MG/ML ON CALL CATH LAB
INTRAVENOUS | Status: AC
  Filled 2022-05-11: qty 1

## 2022-05-11 MED ORDER — PROTAMINE SULFATE 10 MG/ML IV SOLN
INTRAVENOUS | Status: DC | PRN
  Administered 2022-05-11: 120 mg via INTRAVENOUS
  Administered 2022-05-11: 10 mg via INTRAVENOUS

## 2022-05-11 MED ORDER — AMLODIPINE BESYLATE 5 MG PO TABS
5.0000 mg | ORAL_TABLET | Freq: Every day | ORAL | Status: DC
  Administered 2022-05-11 – 2022-05-12 (×2): 5 mg via ORAL
  Filled 2022-05-11 (×2): qty 1

## 2022-05-11 MED ORDER — SODIUM CHLORIDE 0.9 % IV SOLN: 250.0000 mL | INTRAVENOUS | Status: DC | PRN

## 2022-05-11 MED ORDER — TRAMADOL HCL 50 MG PO TABS: 50.0000 mg | ORAL_TABLET | ORAL | Status: DC | PRN

## 2022-05-11 MED ORDER — SUVOREXANT 20 MG PO TABS: 20.0000 mg | ORAL_TABLET | Freq: Every day | ORAL | Status: DC

## 2022-05-11 MED ORDER — ACETAMINOPHEN 325 MG PO TABS
650.0000 mg | ORAL_TABLET | Freq: Four times a day (QID) | ORAL | Status: DC | PRN
  Administered 2022-05-11 – 2022-05-12 (×2): 650 mg via ORAL
  Filled 2022-05-11 (×2): qty 2

## 2022-05-11 MED ORDER — ACETAMINOPHEN 650 MG RE SUPP: 650.0000 mg | Freq: Four times a day (QID) | RECTAL | Status: DC | PRN

## 2022-05-11 MED ORDER — PROPOFOL 500 MG/50ML IV EMUL
INTRAVENOUS | Status: DC | PRN
  Administered 2022-05-11: 30 ug/kg/min via INTRAVENOUS

## 2022-05-11 MED ORDER — EZETIMIBE-SIMVASTATIN 10-40 MG PO TABS: 1.0000 | ORAL_TABLET | Freq: Every day | ORAL | Status: DC

## 2022-05-11 MED ORDER — HEPARIN (PORCINE) IN NACL 1000-0.9 UT/500ML-% IV SOLN
INTRAVENOUS | Status: DC | PRN
  Administered 2022-05-11 (×2): 500 mL

## 2022-05-11 MED ORDER — HEPARIN SODIUM (PORCINE) 1000 UNIT/ML IJ SOLN
INTRAMUSCULAR | Status: DC | PRN
  Administered 2022-05-11: 13000 [IU] via INTRAVENOUS

## 2022-05-11 MED ORDER — OXYCODONE HCL 5 MG PO TABS: 5.0000 mg | ORAL_TABLET | ORAL | Status: DC | PRN

## 2022-05-11 SURGICAL SUPPLY — 26 items
BAG SNAP BAND KOVER 36X36 (MISCELLANEOUS) ×4 IMPLANT
CATH 26 ULTRA DELIVERY (CATHETERS) IMPLANT
CATH DIAG 6FR PIGTAIL ANGLED (CATHETERS) IMPLANT
CATH INFINITI 6F AL1 (CATHETERS) IMPLANT
CLOSURE MYNX CONTROL 6F/7F (Vascular Products) IMPLANT
CLOSURE PERCLOSE PROSTYLE (VASCULAR PRODUCTS) IMPLANT
CRIMPER (MISCELLANEOUS) IMPLANT
DEVICE INFLATION ATRION QL2530 (MISCELLANEOUS) IMPLANT
ELECT DEFIB PAD ADLT CADENCE (PAD) IMPLANT
KIT HEART LEFT (KITS) ×2 IMPLANT
KIT SAPIAN 3 ULTRA RESILIA 26 (Valve) IMPLANT
PACK CARDIAC CATHETERIZATION (CUSTOM PROCEDURE TRAY) ×2 IMPLANT
SHEATH BRITE TIP 7FR 35CM (SHEATH) IMPLANT
SHEATH INTRODUCER SET 20-26 (SHEATH) IMPLANT
SHEATH PINNACLE 6F 10CM (SHEATH) IMPLANT
SHEATH PINNACLE 8F 10CM (SHEATH) IMPLANT
STOPCOCK MORSE 400PSI 3WAY (MISCELLANEOUS) ×4 IMPLANT
SYR MEDRAD MARK V 150ML (SYRINGE) IMPLANT
TRANSDUCER W/STOPCOCK (MISCELLANEOUS) ×4 IMPLANT
TUBING CIL FLEX 10 FLL-RA (TUBING) IMPLANT
WIRE AMPLATZ SS-J .035X180CM (WIRE) IMPLANT
WIRE EMERALD 3MM-J .035X150CM (WIRE) IMPLANT
WIRE EMERALD 3MM-J .035X260CM (WIRE) IMPLANT
WIRE EMERALD ST .035X260CM (WIRE) IMPLANT
WIRE MICRO SET SILHO 5FR 7 (SHEATH) IMPLANT
WIRE SAFARI SM CURVE 275 (WIRE) IMPLANT

## 2022-05-11 NOTE — Progress Notes (Signed)
Patient arrived from [facility] to 4e11, patient with B groins level zero at this time. Vital signs obtained and CCMD made aware patient on monitor. Patient given call bell and it is with in reach. Conlan Miceli, Bettina Gavia rN

## 2022-05-11 NOTE — H&P (Signed)
[address]       [place] [address]             [phone]                                        Reinhold Z Kolton III [facility] Medical Record #[id number] Date of Birth: [birth year]   Referring: Jerline Pain, MD Primary Care: Crist Infante, MD Primary Cardiologist:Mark Marlou Porch, MD   Chief Complaint:        Chief Complaint  Patient presents with   Aortic Stenosis      Surgical consult for TAVR, review all testing      History of Present Illness:     76 year old male here regarding evaluation for transcatheter aortic valve. He has shortness of breath with moderate activity. Denies angina or syncope. Good dental health. On xarelto for AF and also has HTN and HL.   As for his AS, Paradoxical low-flow low gradient severe bicuspid aortic stenosis with an aortic valve area of 0.67 cm, dimensionless index of 0.23, mean gradient of 21 mmHg, stroke-volume index of less than 35, and ejection fraction of 55 to 60%; EKG with atrial fibrillation with no bundle-branch blocks       Past Medical History:  Diagnosis Date   Abnormal CT scan 11/26/2015   Atrial fibrillation (HCC)     AVD (aortic valve disease)      with bicupsid aortic valve   Chronic anticoagulation 11/26/2015   Erectile dysfunction 01/10/2013   Hypercholesterolemia 12/16/2010   Hyperlipidemia     Hypertension     Mild aortic stenosis     MVP (mitral valve prolapse)     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   PAF (paroxysmal atrial fibrillation) ([place]) 11/26/2015   Seasonal allergies 11/19/2014           Past Surgical History:  Procedure Laterality Date   EPIGASTRIC HERNIA REPAIR N/A 05/19/2020    Procedure: OPEN EPIGASTRIC HERNIA REPAIR WITH MESH;  Surgeon: Kinsinger, Arta Bruce, MD;  Location: [facility];  Service: General;  Laterality: N/A;   EYELID LACERATION REPAIR   2008    left and right   [place]    right removal -- cancer   MANDIBLE SURGERY   1987    right reconstruction    RIGHT/LEFT HEART CATH AND CORONARY ANGIOGRAPHY N/A 03/26/2022    Procedure: RIGHT/LEFT HEART CATH AND CORONARY ANGIOGRAPHY;  Surgeon: Early Osmond, MD;  Location: [facility];  Service: Cardiovascular;  Laterality: N/A;   TRANSTHORACIC ECHOCARDIOGRAM   07/18/2002    ef 71%      Social History       Tobacco Use  Smoking Status Never  Smokeless Tobacco Never    Social History       Substance and Sexual Activity  Alcohol Use No             Allergies  Allergen Reactions   Crestor [Rosuvastatin]        Legs aches and pain             Current Outpatient Medications  Medication Sig Dispense Refill   amLODipine (NORVASC) 5 MG tablet Take 5 mg by mouth daily.        amoxicillin (AMOXIL) 500 MG capsule TAKE 4 CAPSULES BY MOUTH 1 HOUR  BEFORE DENTAL APPOINTMENT 4 capsule 3   BELSOMRA 20 MG TABS Take 20 mg by mouth at bedtime.       Cholecalciferol (VITAMIN D) 2000 UNITS tablet Take 2,000 Units by mouth daily.       Coenzyme Q10 (CO Q 10) 100 MG CAPS Take 100 mg by mouth daily.       ezetimibe-simvastatin (VYTORIN) 10-40 MG per tablet Take 1 tablet by mouth at bedtime.       fish oil-omega-3 fatty acids 1000 MG capsule Take 1 g by mouth daily.       fluticasone (FLONASE) 50 MCG/ACT nasal spray Place 1 spray into both nostrils daily as needed for allergies or rhinitis.        metoprolol succinate (TOPROL-XL) 25 MG 24 hr tablet TAKE 1/2 TABLET(12.5 MG) BY MOUTH DAILY 45 tablet 1   metroNIDAZOLE (METROGEL) 0.75 % gel Apply 1 Application topically 2 (two) times daily as needed (rosacea).       olmesartan (BENICAR) 20 MG tablet Take 20 mg by mouth daily.       rivaroxaban (XARELTO) 20 MG TABS tablet TAKE 1 TABLET(20 MG) BY MOUTH DAILY 90 tablet 0    No current facility-administered medications for this visit.      (Not in a hospital admission)          Family History  Problem Relation Age of Onset   Stroke Mother     Heart attack Father     Hypertension Father      Hypertension Sister     Colon cancer Neg Hx     Stomach cancer Neg Hx     Rectal cancer Neg Hx     Esophageal cancer Neg Hx          Review of Systems:    ROS14 point neg except as per hpi   Physical Exam: BP 124/87 (BP Location: Right Arm, Patient Position: Sitting, Cuff Size: Normal)   Pulse (!) 40   Resp 20   Ht '5\' 6"'$  (1.676 m)   Wt 164 lb 1.8 oz (74.4 kg)   SpO2 95% Comment: RA  BMI 26.49 kg/m    NAD  Alert Nonlaboured resp Abd soft nd Extr wwp   Diagnostic Studies & Laboratory data:     Recent Radiology Findings:    Imaging Results (Last 48 hours)  No results found.     I have independently reviewed the above radiologic studies and discussed with the patient    Recent Lab Findings: Recent Labs       Lab Results  Component Value Date    WBC 5.9 05/07/2022    HGB 13.0 05/07/2022    HCT 39.6 05/07/2022    PLT 124 (L) 05/07/2022    GLUCOSE 96 05/07/2022    CHOL 113 04/22/2020    TRIG 80 04/22/2020    HDL 38 (L) 04/22/2020    LDLCALC 59 04/22/2020    ALT 16 05/07/2022    AST 26 05/07/2022    NA 139 05/07/2022    K 3.9 05/07/2022    CL 107 05/07/2022    CREATININE 1.10 05/07/2022    BUN 11 05/07/2022    CO2 26 05/07/2022    TSH 2.28 12/17/2011    INR 1.4 (H) 05/07/2022      EXAM: Cardiac TAVR CT   TECHNIQUE: The patient was scanned on a Siemens Force 329 slice scanner. A 120 kV retrospective scan was triggered in the ascending thoracic aorta at 140 HU's. Gantry  rotation speed was 250 msecs and collimation was .6 mm. No beta blockade or nitro were given. The 3D data set was reconstructed in 5% intervals of the R-R cycle. Systolic and diastolic phases were analyzed on a dedicated work station using MPR, MIP and VRT modes. The patient received 80 cc of contrast.   FINDINGS: Aortic Valve: Tri leaflet AV with calcium score 2436   Aorta: No aneurysm normal arch vessels mild calcific atherosclerosis   Sino-tubular Junction: 30 mm   Ascending  Thoracic Aorta: 35 mm   Aortic Arch: 24 mm   Descending Thoracic Aorta: 23 mm   Sinus of Valsalva Measurements:   Non-coronary: 36.4 mm   Right - coronary: 33.4 mm   Left -   coronary: 33.9 mm   Coronary Artery Height above Annulus:   Left Main: 17.1 mm above annulus   Right Coronary: 17.1 mm above annulus   Virtual Basal Annulus Measurements:   Maximum / Minimum Diameter: 28.8 mm x 22.2 mm   Perimeter: 81.1 mm   Area: 500 mm 2   Coronary Arteries: Sufficient height above annulus for deployment   Optimum Fluoroscopic Angle for Delivery: LAO 10 Caudal 17 degrees   IMPRESSION: 1.  Tri leaflet AV with calcium score 2436   2. Annular area of 500 mm2 suitable for a 26 mm Sapien 3 Ultra valve   3.  Coronary arteries sufficient height above annulus for deployment   4. Optimum angiographic angle for deployment LAO 10 Caudal 17 degrees   5.  Mixing artifact in LAA cannot r/o thrombus   6.  Membranous septal length 9 mm   Jenkins Rouge   Electronically Signed: By: Jenkins Rouge M.D. On: 04/08/2022 11:57   CLINICAL DATA:  Preop evaluation for aortic valve replacement   EXAM: CT ANGIOGRAPHY CHEST, ABDOMEN AND PELVIS   TECHNIQUE: Non-contrast CT of the chest was initially obtained.   Multidetector CT imaging through the chest, abdomen and pelvis was performed using the standard protocol during bolus administration of intravenous contrast. Multiplanar reconstructed images and MIPs were obtained and reviewed to evaluate the vascular anatomy.   RADIATION DOSE REDUCTION: This exam was performed according to the departmental dose-optimization program which includes automated exposure control, adjustment of the mA and/or kV according to patient size and/or use of iterative reconstruction technique.   CONTRAST:  153m OMNIPAQUE IOHEXOL 350 MG/ML SOLN   COMPARISON:  CT abdomen and pelvis dated Nov 13, 2015   FINDINGS: CTA CHEST FINDINGS   Cardiovascular: Normal  heart size. No pericardial effusion. Normal caliber thoracic aorta with mild atherosclerotic disease. Aortic valve thickening and calcifications. Coronary artery calcifications of the LAD and RCA. No suspicious filling defects of the central pulmonary arteries.   Mediastinum/Nodes: Small hiatal hernia. Bilateral thyroid nodules, previously evaluated with thyroid ultrasound. No pathologically enlarged lymph nodes seen in the chest.   Lungs/Pleura: Central airways are patent. No consolidation, pleural effusion or pneumothorax.   Musculoskeletal: Chronic appearing deformities several left lateral ribs. No aggressive soft tissue components. No acute osseous findings.   CTA ABDOMEN AND PELVIS FINDINGS   Hepatobiliary: No focal liver abnormality is seen. No gallstones, gallbladder wall thickening, or biliary dilatation.   Pancreas: Unremarkable. No pancreatic ductal dilatation or surrounding inflammatory changes.   Spleen: Normal in size without focal abnormality.   Adrenals/Urinary Tract: Bilateral adrenal glands are unremarkable. Simple appearing cyst of the left kidney and lesion of the anterior kidney which is too small to completely characterize, no specific follow-up imaging is  recommended. Bladder is thick-walled, similar to prior exam.   Stomach/Bowel: Stomach is within normal limits. Appendix appears normal. No evidence of bowel wall thickening, distention, or inflammatory changes.   Vascular/lymphatic: No pathologically enlarged lymph nodes seen in the chest. Normal caliber abdominal aorta with moderate calcified and noncalcified plaque moderate narrowing seen at the origins of the bilateral renal arteries due to calcified and noncalcified plaque.   Reproductive: Prostatomegaly   Other: Moderate left fat containing inguinal hernia. Abdominopelvic ascites.   Musculoskeletal: L4-L5 anterolisthesis with chronic bilateral pars defects. Aggressive appearing osseous  lesions.   VASCULAR MEASUREMENTS PERTINENT TO TAVR:   AORTA:   Minimal Aortic Diameter-13.6 mm   Severity of Aortic Calcification-moderate   RIGHT PELVIS:   Right Common Iliac Artery -   Minimal Diameter-10.8 mm   Tortuosity-severe   Calcification-moderate   Right External Iliac Artery -   Minimal Diameter-10.7 mm   Tortuosity-severe   Calcification-none   Right Common Femoral Artery -   Minimal Diameter-10.3 mm   Tortuosity-none   Calcification-mild   LEFT PELVIS:   Left Common Iliac Artery -   Minimal Diameter-8.8 mm   Tortuosity-severe   Calcification-moderate   Left External Iliac Artery -   Minimal Diameter-10.1 mm   Tortuosity-severe   Calcification-none   Left Common Femoral Artery -   Minimal Diameter-9.2 mm   Tortuosity-none   Calcification-mild   Review of the MIP images confirms the above findings.   IMPRESSION: 1. Vascular findings and measurements pertinent to potential TAVR procedure, as detailed above. 2. Thickening and calcification of the aortic valve, compatible with reported clinical history of aortic stenosis. 3. Moderate aortoiliac atherosclerosis. Coronary artery calcifications of the LAD and RCA. 4. Bladder wall thickening and prostatomegaly, findings are likely due to chronic outlet obstruction.       Assessment / Plan:     44M presents with symptomatic severe aortic stenosis. Meets criteria for AV replacement. STS risk 1.8%.  NYHA class: II   Patient a candidate for SAVR or TAVR, he elects for TAVR.   Bailout: yes Valve: 81 Edwards S3 Access: transfemoral, does have tortuosity Dental: no issues Misc Procedural: had what appeared to be mixing artifact in LAA

## 2022-05-11 NOTE — Op Note (Signed)
[facility]  TAVR OPERATIVE NOTE   Date of Procedure:  05/11/2022  Preoperative Diagnosis: Severe Aortic Stenosis   Postoperative Diagnosis: Same   Procedure:   Transcatheter Aortic Valve Replacement - Transfemoral Approach  Edwards Sapien 3 Resilia THV (size 26 mm, model # 9755RLS, serial # [id number])   Co-Surgeons:   Justice Rocher, MD and Lenna Sciara, MD Anesthesiologist:  Rochele Pages, DO  Echocardiographer:  Rudean Haskell, MD  Pre-operative Echo Findings: Severe aortic stenosis Normal left ventricular systolic function  Post-operative Echo Findings: Trace paravalvular leak Normal left ventricular systolic function  Left Heart Catheterization Findings: Left ventricular end-diastolic pressure of 56mHg   BRIEF CLINICAL NOTE AND INDICATIONS FOR SURGERY  The patient is a 76year old male with a history of symptomatic paradoxical low-flow low gradient bicuspid aortic stenosis, hypertension, atrial fibrillation on Xarelto, hyperlipidemia, and osteosarcoma of the jaw now in remission who was referred for recommendations regarding NYHA class II dyspnea that is lifestyle limiting.  During the course of the patient's preoperative work up they have been evaluated comprehensively by a multidisciplinary team of specialists [facility].  They have been demonstrated to suffer from symptomatic severe aortic stenosis as noted above. The patient has been counseled extensively as to the relative risks and benefits of all options for the treatment of severe aortic stenosis including long term medical therapy, conventional surgery for aortic valve replacement, and transcatheter aortic valve replacement.  The patient has been independently evaluated by Dr. ETenny Crawwith CT surgery and they are felt to be at high risk for conventional surgical aortic valve replacement. The surgeon  indicated the patient would be a poor candidate for conventional surgery. Based upon review of all of the patient's preoperative diagnostic tests they are felt to be candidate for transcatheter aortic valve replacement using the transfemoral approach as an alternative to high risk conventional surgery.    Following the decision to proceed with transcatheter aortic valve replacement, a discussion has been held regarding what types of management strategies would be attempted intraoperatively in the event of life-threatening complications, including whether or not the patient would be considered a candidate for the use of cardiopulmonary bypass and/or conversion to open sternotomy for attempted surgical intervention.  The patient has been advised of a variety of complications that might develop peculiar to this approach including but not limited to risks of death, stroke, paravalvular leak, aortic dissection or other major vascular complications, aortic annulus rupture, device embolization, cardiac rupture or perforation, acute myocardial infarction, arrhythmia, heart block or bradycardia requiring permanent pacemaker placement, congestive heart failure, respiratory failure, renal failure, pneumonia, infection, other late complications related to structural valve deterioration or migration, or other complications that might ultimately cause a temporary or permanent loss of functional independence or other long term morbidity.  The patient provides full informed consent for the procedure as described and all questions were answered preoperatively.    DETAILS OF THE OPERATIVE PROCEDURE  PREPARATION:   The patient is brought to the operating room on the above mentioned date and central monitoring was established by the anesthesia team including placement of a radial arterial line. The patient is placed in the supine position on the operating table.  Intravenous antibiotics are administered. Conscious sedation is  used.   Baseline transthoracic echocardiogram was performed. The patient's chest, abdomen, both groins, and both lower extremities are prepared and draped in a sterile manner. A time out procedure is  performed.   PERIPHERAL ACCESS:   Using the modified Seldinger technique, femoral arterial and venous access were obtained with placement of a 6 Fr sheath in the artery and a 7 Fr sheath in the vein on the left and right sides using u/s guidance.  A pigtail diagnostic catheter was passed through the femoral arterial sheath under fluoroscopic guidance into the aortic root.   Aortic root angiography was performed in order to determine the optimal angiographic angle for valve deployment.  TRANSFEMORAL ACCESS:  A micropuncture kit was used to gain access to the right femoral artery using u/s guidance. Position confirmed with angiography. Pre-closure with double ProGlide closure devices. The patient was heparinized systemically and ACT verified > 250 seconds.    A 14 Fr transfemoral E-sheath was introduced into the right femoral artery after progressively dilating over an Amplatz superstiff wire. An AL-2 catheter was used to direct a straight-tip exchange length wire across the native aortic valve into the left ventricle. This was exchanged out for a pigtail catheter and position was confirmed in the LV apex. Simultaneous left ventricular, aortic, and left ventricular end-diastolic pressures were recorded.  The pigtail catheter was then exchanged for an Safari wire in the LV apex.  Direct LV pacing thresholds were assessed and found to be adequate.   TRANSCATHETER HEART VALVE DEPLOYMENT:  An Edwards Sapien 3 THV (size 26 mm) was prepared and crimped per manufacturer's guidelines, and the proper orientation of the valve is confirmed on the [employer] delivery system. The valve was advanced through the introducer sheath using normal technique until in an appropriate position in the abdominal aorta beyond  the sheath tip. The balloon was then retracted and using the fine-tuning wheel was centered on the valve. The valve was then advanced across the aortic arch using appropriate flexion of the catheter. The valve was carefully positioned across the aortic valve annulus. The Commander catheter was retracted using normal technique. Once final position of the valve has been confirmed by angiographic assessment, the valve is deployed while temporarily holding ventilation and during rapid ventricular pacing to maintain systolic blood pressure < 50 mmHg and pulse pressure < 10 mmHg. The balloon inflation is held for >3 seconds after reaching full deployment volume. Once the balloon has fully deflated the balloon is retracted into the ascending aorta and valve function is assessed using TTE. There is felt to be trace paravalvular leak and no central aortic insufficiency.  The patient's hemodynamic recovery following valve deployment is good.  The deployment balloon and guidewire are both removed. Echo demostrated acceptable post-procedural gradients, stable mitral valve function, and no significant AI.   PROCEDURE COMPLETION:  The sheath was then removed and closure devices were completed. Protamine was administered once femoral arterial repair was complete. The temporary pacemaker, pigtail catheters and femoral sheaths were removed with a Mynx closure device placed in the artery and manual pressure used for venous hemostasis.    The patient tolerated the procedure well and is transported to the surgical intensive care in stable condition. There were no immediate intraoperative complications. All sponge instrument and needle counts are verified correct at completion of the operation.   No blood products were administered during the operation.  The patient received a total of 90 mL of intravenous contrast during the procedure.  Early Osmond MD 05/11/2022 11:33 AM

## 2022-05-11 NOTE — Progress Notes (Signed)
Operative Note: Patient Name: Brian Klein Date of Birth: 05-24-1946 Date of Operation: 05/11/22  Operation: 23 S3 Sapien, Transfemoral  Surgeon: Justice Rocher MD  Co-Surgeon: Lenna Sciara MD  Findings: Excellent implant depth Trace AI  Description of Procedure: The patient was brought in the operating room and laid in supine position.  The patient was prepped and draped in standard fashion.  Arterial and venous lines were placed by anesthesia. Patient was sedated. Left femoral arterial access was gained and checked with contrast. Right femoral arterial access gained and checked with contrast. Right femoral venous access gained and small Fr sheath inserted. Left french arterial pigtail was advanced into the aortic sinuses. Right femoral arterial two percloses were used and using a stiff wire the tortious femoral artery was straightened without any difficulty. Edwards large bore sheath had been placed. LV pacing equipment was set up as well. We crossed the valve with an AL and straight wire and then to a safari wire and advance the 42m S3 Sapien TAVR system into descending aorta. 50% flex and valve was prepared and it was then advanced into the aortic valve and it was deployed when pacing. Balloon was up for 3 seconds fully inflated. Checked by echo and the valve had trivial AR (most was around the wire) and implant depth was good. We then removed the TAVR system and then removed the large bore Edwards sheath and used the 2 percloses. We then did an angio of the right leg and there was no significant obstruction. Manual pressure had been held and protamine given as well. Defer to Dr. TAli Lowefor treatment of remaining arterial and venous small bore sheaths. Patient was moved to recovery area in stable condition.   DJustice RocherMD CV Surgery

## 2022-05-11 NOTE — Discharge Summary (Incomplete)
[place] VALVE TEAM  Discharge Summary    Patient ID: Brian Klein MRN: [id number]; DOB: January 29, 1946  Admit date: 05/11/2022 Discharge date: 05/12/2022  Primary Care Provider: Crist Infante, MD  Primary Cardiologist: Candee Furbish, MD / Dr. Ali Lowe & Dr. Tenny Craw (TAVR)  Discharge Diagnoses    Principal Problem:   S/P TAVR (transcatheter aortic valve replacement) Active Problems:   Bicuspid aortic valve   Permanent atrial fibrillation (HCC)   Hypertension   Hyperlipidemia  Allergies Allergies  Allergen Reactions   Crestor [Rosuvastatin]     Legs aches and pain     Diagnostic Studies/Procedures    TAVR OPERATIVE NOTE     Date of Procedure:                05/11/2022   Preoperative Diagnosis:      Severe Aortic Stenosis    Postoperative Diagnosis:    Same    Procedure:        Transcatheter Aortic Valve Replacement - Transfemoral Approach             Edwards Sapien 3 Resilia THV (size 26 mm, model # 9755RLS, serial # [id number])              Co-Surgeons:                         Justice Rocher, MD and Lenna Sciara, MD Anesthesiologist:                  Rochele Pages, DO   Echocardiographer:              Rudean Haskell, MD   Pre-operative Echo Findings: Severe aortic stenosis Normal left ventricular systolic function   Post-operative Echo Findings: Trace paravalvular leak Normal left ventricular systolic function   Left Heart Catheterization Findings: Left ventricular end-diastolic pressure of 7mHg _____________   Echo 05/12/22: Completed but pending formal read at the time of discharge   History of Present Illness     Brian Klein is a 76y.o. male with a history of chronic afib on Xarelto, HTN, HLD, osteosarcoma of jaw s/p remote resection/chemo, mild MR and paradoxical LFLG AS [facility].   He was seen by Dr. SMarlou Porchin 05/2021 and reported some shortness of  breath with moderate activity. Echocardiogram 02/2022 showed EF 55% and severe LFLG AS with a mean gradient of 22 mm hg, AVA 0.67cm2, DVI 0.23, SVI 29 as well as mild AI[facility]MR[facility] to moderate [facility] coronary artery disease.   He was evaluated by the multidisciplinary valve team and felt to have severe, symptomatic aortic stenosis and to be a suitable candidate for TAVR, which was set up for 05/11/22.   Hospital Course     Consultants: none   Severe AS: s/p successful TAVR with a 26 mm Edwards Sapien 3 Ultra Resilia  THV via the TF approach on 05/11/22. Post operative echo pending. Groin sites are stable. ECG with rate controlled AF with PVC and no high grade heart block. Home Xarelto switched to Eliquis '5mg'$  BID given increased bleeding risk with Xarelto [facility] Plan for discharge home today with close follow up in the office next week. Post discharge instructions reviewed with understanding. Will nee lifelong SBE however this will be discussed at follow up next week.   Chronic afib: Rate well controlled. Resume  home Toprol XL. As above, Xarelto switched to Eliquis '5mg'$  BID.   HTN: BP well controlled. Resume home meds.  ____________  Discharge Vitals Blood pressure 113/69, pulse 90, temperature 98.9 F (37.2 C), temperature source Oral, resp. rate (!) 23, height '5\' 6"'$  (1.676 m), weight 72.3 kg, SpO2 96 %.  Filed Weights   05/11/22 0645 05/12/22 0400  Weight: 72.6 kg 72.3 kg   General: Well developed, well nourished, NAD Lungs:Clear to ausculation bilaterally. No wheezes, rales, or rhonchi. Breathing is unlabored. Cardiovascular: RRR with S1 S2. No murmurs Extremities: No edema. Groin sites stable with no evidence of hematoma or bleeding.  Neuro: Alert and oriented. No focal deficits. No facial asymmetry. MAE spontaneously. Psych: Responds to questions appropriately with normal affect.    Labs & Radiologic Studies    CBC Recent Labs     05/11/22 1154 05/12/22 0143  WBC  --  11.0*  HGB 11.9* 12.9*  HCT 35.0* 39.2  MCV  --  78.4*  PLT  --  680*   Basic Metabolic Panel Recent Labs    05/11/22 1154 05/12/22 0143  NA 140 137  K 4.3 3.6  CL 108 106  CO2  --  22  GLUCOSE 114* 93  BUN 11 13  CREATININE 0.90 1.08  CALCIUM  --  8.9  MG  --  1.9   Liver Function Tests No results for input(s): "AST", "ALT", "ALKPHOS", "BILITOT", "PROT", "ALBUMIN" in the last 72 hours. No results for input(s): "LIPASE", "AMYLASE" in the last 72 hours. Cardiac Enzymes No results for input(s): "CKTOTAL", "CKMB", "CKMBINDEX", "TROPONINI" in the last 72 hours. BNP Invalid input(s): "POCBNP" D-Dimer No results for input(s): "DDIMER" in the last 72 hours. Hemoglobin A1C No results for input(s): "HGBA1C" in the last 72 hours. Fasting Lipid Panel No results for input(s): "CHOL", "HDL", "LDLCALC", "TRIG", "CHOLHDL", "LDLDIRECT" in the last 72 hours. Thyroid Function Tests No results for input(s): "TSH", "T4TOTAL", "T3FREE", "THYROIDAB" in the last 72 hours.  Invalid input(s): "FREET3" _____________  ECHOCARDIOGRAM LIMITED  Result Date: 05/11/2022    ECHOCARDIOGRAM LIMITED REPORT   Patient Name:   Brian Klein Date of Exam: 05/11/2022 Medical Rec #:  [id number]         Height:       66.0 in Accession #:    [id number]        Weight:       160.0 lb Date of Birth:  [birth year]         BSA:          1.819 m Patient Age:    52 years          BP:           125/84 mmHg Patient Gender: M                 HR:           85 bpm. Exam Location:  Inpatient Procedure: Cardiac Doppler, Limited Echo and Color Doppler Indications:     I35.0 Nonrheumatic aortic (valve) stenosis  History:         Patient has prior history of Echocardiogram examinations, most                  recent 02/10/2022. Abnormal ECG, Aortic Valve Disease,                  Arrythmias:Atrial Fibrillation; Risk Factors:Dyslipidemia.  Aortic stenosis.                  Aortic  Valve: 26 mm Sapien prosthetic, stented (TAVR) valve is                  present in the aortic position. Procedure Date: 05/11/2022.  Sonographer:     Roseanna Rainbow RDCS Referring Phys:  Early Osmond Diagnosing Phys: Rudean Haskell MD IMPRESSIONS  1. Interventional TTE for TAVR Placement.  2. Prior to procedure, calcified aortic valve with severe aortic stenosis. AVA 0.95 cm2. DVI 0.27. Mean gradient 29 mm Hg, Peak gradient 50 mm Hg.  3. Post procedure a 26 mm Sapien valve present. Trivial PVL adjacent to the intraventricular septum seen in the PLAX post wire removal. Mean gradient 3 mm Hg, Peak gradient 5 mm Hg. EOA 4.15 cm2. Normal DVI.  4. Left ventricular ejection fraction, by estimation, is 55 to 60%. The left ventricle has normal function. The left ventricle has no regional wall motion abnormalities.  5. Right ventricular systolic function is low normal.  6. Moderate mitral valve regurgitation.  7. Tricuspid valve regurgitation is moderate.  8. Trace perivalvular leak post deployment. . The aortic valve is calcified. Aortic valve regurgitation is mild. There is a 26 mm Sapien prosthetic (TAVR) valve present in the aortic position. Procedure Date: 05/11/2022. Comparison(s): Successful TAVR Placement. FINDINGS  Left Ventricle: Left ventricular ejection fraction, by estimation, is 55 to 60%. The left ventricle has normal function. The left ventricle has no regional wall motion abnormalities. Right Ventricle: Right ventricular systolic function is low normal. Pericardium: There is no evidence of pericardial effusion. Mitral Valve: Moderate mitral valve regurgitation. Tricuspid Valve: Tricuspid valve regurgitation is moderate. Aortic Valve: Trace perivalvular leak post deployment. The aortic valve is calcified. Aortic valve regurgitation is mild. Aortic valve mean gradient measures 3.0 mmHg. Aortic valve peak gradient measures 4.8 mmHg. Aortic valve area, by VTI measures 4.15 cm. There is a 26 mm Sapien  prosthetic, stented (TAVR) valve present in the aortic position. Procedure Date: 05/11/2022. LEFT VENTRICLE PLAX 2D LVOT diam:     2.60 cm LV SV:         87 LV SV Index:   48 LVOT Area:     5.31 cm  LV Volumes (MOD) LV vol d, MOD A2C: 85.6 ml LV vol s, MOD A2C: 41.7 ml LV SV MOD A2C:     43.9 ml AORTIC VALVE AV Area (Vmax):    4.48 cm AV Area (Vmean):   3.90 cm AV Area (VTI):     4.15 cm AV Vmax:           110.00 cm/s AV Vmean:          77.000 cm/s AV VTI:            0.210 m AV Peak Grad:      4.8 mmHg AV Mean Grad:      3.0 mmHg LVOT Vmax:         92.80 cm/s LVOT Vmean:        56.500 cm/s LVOT VTI:          0.164 m LVOT/AV VTI ratio: 0.78  AORTA Ao Asc diam: 3.90 cm  SHUNTS Systemic VTI:  0.16 m Systemic Diam: 2.60 cm Rudean Haskell MD Electronically signed by Rudean Haskell MD Signature Date/Time: 05/11/2022/11:54:24 AM    Final    Structural Heart Procedure  Result Date: 05/11/2022 See surgical note for result.  DG Chest  2 View  Result Date: 05/09/2022 CLINICAL DATA:  Preop EXAM: CHEST - 2 VIEW COMPARISON:  CT angiogram chest 04/08/2022 FINDINGS: The heart size and mediastinal contours are within normal limits. Both lungs are clear. Chronic left-sided rib deformities are unchanged. No acute fractures are identified. IMPRESSION: No active cardiopulmonary disease. Electronically Signed   By: Ronney Asters M.D.   On: 05/09/2022 23:48   Disposition   Pt is being discharged home today in good condition.  Follow-up Plans & Appointments    Follow-up Information     Tommie Raymond, NP Follow up on 05/17/2022.   Specialty: Cardiology Why: @ 11:15am, please arrive at least 10 minutes early Contact information: [address] [place] [address] [phone]                Discharge Instructions     Call MD for:  difficulty breathing, headache or visual disturbances   Complete by: As directed    Call MD for:  extreme fatigue   Complete by: As directed     Call MD for:  hives   Complete by: As directed    Call MD for:  persistant dizziness or light-headedness   Complete by: As directed    Call MD for:  persistant nausea and vomiting   Complete by: As directed    Call MD for:  redness, tenderness, or signs of infection (pain, swelling, redness, odor or green/yellow discharge around incision site)   Complete by: As directed    Call MD for:  severe uncontrolled pain   Complete by: As directed    Call MD for:  temperature >100.4   Complete by: As directed    Change dressing (specify)   Complete by: As directed    Dressing change: If current dressing is bothering you, you may take it off and replace with Bandaid or leave open to air.   Diet - low sodium heart healthy   Complete by: As directed    Discharge instructions   Complete by: As directed    ACTIVITY AND EXERCISE  Daily activity and exercise are an important part of your recovery. People recover at different rates depending on their general health and type of valve procedure.  Most people recovering from TAVR feel better relatively quickly   No lifting, pushing, pulling more than 10 pounds (examples to avoid: groceries, vacuuming, gardening, golfing):             - For one week with a procedure through the groin.             - For six weeks for procedures through the chest wall or neck. NOTE: You will typically see one of our providers 7-14 days after your procedure to discuss [place] the above activities.      DRIVING  Do not drive until you are seen for follow up and cleared by a provider. Generally, we ask patient to not drive for 1 week after their procedure.  If you have been told by your doctor in the past that you may not drive, you must talk with him/her before you begin driving again.   DRESSING  Groin site: you may leave the clear dressing over the site for up to one week or until it falls off.   HYGIENE  If you had a femoral (leg) procedure, you may take a  shower when you return home. After the shower, pat the site dry. Do NOT use powder, oils or  lotions in your groin area until the site has completely healed.  If you had a chest procedure, you may shower when you return home unless specifically instructed not to by your discharging practitioner.             - DO NOT scrub incision; pat dry with a towel.             - DO NOT apply any lotions, oils, powders to the incision.             - No tub baths / swimming for at least 2 weeks.  If you notice any fevers, chills, increased pain, swelling, bleeding or pus, please contact your doctor.   ADDITIONAL INFORMATION  If you are going to have an upcoming dental procedure, please contact our office as you will require antibiotics ahead of time to prevent infection on your heart valve.    If you have any questions or concerns you can call the structural heart phone during normal business hours 8am-4pm. If you have an urgent need after hours or weekends please call [phone] to talk to the on call provider for general cardiology. If you have an emergency that requires immediate attention, please call 911.    After TAVR Checklist  Check  Test Description  Follow up appointment in 1-2 weeks  You will see our structural heart advanced practice provider. Your incision sites will be checked and you will be cleared to drive and resume all normal activities if you are doing well.    1 month echo and follow up  You will have an echo to check on your new heart valve and be seen back in the office by a structural heart advanced practice provider.  Follow up with your primary cardiologist You will need to be seen by your primary cardiologist in the following 3-6 months after your 1 month appointment in the valve clinic.   1 year echo and follow up You will have another echo to check on your heart valve after 1 year and be seen back in the office by a structural heart advanced practice provider. This your last  structural heart visit.  Bacterial endocarditis prophylaxis  You will have to take antibiotics for the rest of your life before all dental procedures (even teeth cleanings) to protect your heart valve. Antibiotics are also required before some surgeries. Please check with your cardiologist before scheduling any surgeries. Also, please make sure to tell us if you have a penicillin allergy as you will require an alternative antibiotic.   Increase activity slowly   Complete by: As directed       Discharge Medications   Allergies as of 05/12/2022       Reactions   Crestor [rosuvastatin]    Legs aches and pain         Medication List     STOP taking these medications    Xarelto 20 MG Tabs tablet Generic drug: rivaroxaban       TAKE these medications    amLODipine 5 MG tablet Commonly known as: NORVASC Take 5 mg by mouth daily.   amoxicillin 500 MG capsule Commonly known as: AMOXIL TAKE 4 CAPSULES BY MOUTH 1 HOUR BEFORE DENTAL APPOINTMENT   apixaban 5 MG Tabs tablet Commonly known as: ELIQUIS Take 1 tablet (5 mg total) by mouth 2 (two) times daily.   Belsomra 20 MG Tabs Generic drug: Suvorexant Take 20 mg by mouth at bedtime.   Co Q 10 100 MG  Caps Take 100 mg by mouth daily.   ezetimibe-simvastatin 10-40 MG tablet Commonly known as: VYTORIN Take 1 tablet by mouth at bedtime.   fish oil-omega-3 fatty acids 1000 MG capsule Take 1 g by mouth daily.   fluticasone 50 MCG/ACT nasal spray Commonly known as: FLONASE Place 1 spray into both nostrils daily as needed for allergies or rhinitis.   metoprolol succinate 25 MG 24 hr tablet Commonly known as: TOPROL-XL TAKE 1/2 TABLET(12.5 MG) BY MOUTH DAILY   metroNIDAZOLE 0.75 % gel Commonly known as: METROGEL Apply 1 Application topically 2 (two) times daily as needed (rosacea).   olmesartan 20 MG tablet Commonly known as: BENICAR Take 20 mg by mouth daily.   Vitamin D 50 MCG (2000 UT) tablet Take 2,000 Units by  mouth daily.               Discharge Care Instructions  (From admission, onward)           Start     Ordered   05/12/22 0000  Change dressing (specify)       Comments: Dressing change: If current dressing is bothering you, you may take it off and replace with Bandaid or leave open to air.   05/12/22 0927            Outstanding Labs/Studies   None   Duration of Discharge Encounter   Greater than 30 minutes including physician time.  SignedKathyrn Drown, NP 05/12/2022, 9:29 AM [phone]   ATTENDING ATTESTATION:  After conducting a review of all available clinical information with the care team, interviewing the patient, and performing a physical exam, I agree with the findings and plan described in this note.   GEN: No acute distress.   HEENT:  MMM, no JVD, no scleral icterus Cardiac: Irregular RR, no murmurs, rubs, or gallops.  Respiratory: Clear to auscultation bilaterally. GI: Soft, nontender, non-distended  MS: No edema; No deformity. Neuro:  Nonfocal  Vasc:  +2 radial pulses, groin stable  The patient is doing well after uncomplicated TAVR from the right transfemoral approach with a 26 mm SAPIEN 3 valve.  He has no evidence of conduction abnormalities, signs or symptoms of stroke, and his access sites are stable.  Patient stable for discharge home.  Based on [facility] trial will convert to apixaban given chronic atrial fibrillation.  Lenna Sciara, MD Pager [phone]

## 2022-05-11 NOTE — Progress Notes (Signed)
  Echocardiogram 2D Echocardiogram has been performed.  Brian Klein 05/11/2022, 11:11 AM

## 2022-05-11 NOTE — Plan of Care (Signed)
  Problem: Health Behavior/Discharge Planning: Goal: Ability to manage health-related needs will improve Outcome: Progressing   Problem: Clinical Measurements: Goal: Ability to maintain clinical measurements within normal limits will improve Outcome: Progressing Goal: Will remain free from infection Outcome: Progressing   Problem: Activity: Goal: Risk for activity intolerance will decrease Outcome: Progressing   

## 2022-05-11 NOTE — TOC Benefit Eligibility Note (Signed)
Patient Teacher, English as a foreign language completed.    The patient is currently admitted and upon discharge could be taking Eliquis '5mg'$ .  The current 30 day co-pay is $40.00.   The patient is insured through [facility] [place] Patient Advocate Specialist [place] Patient Advocate Team Direct Number: [phone] Fax: [phone]

## 2022-05-11 NOTE — Progress Notes (Signed)
  [place] VALVE TEAM  Patient doing well s/p TAVR. He is hemodynamically stable. Groin sites stable. ECG with afib with slow VR and no high grade block. Plan to DC arterial line and transfer to 4E. Early ambulation after bedrest completed and hopeful discharge over the next 24-48 hours.   Angelena Form PA-C  [facility]  Pager [phone]

## 2022-05-11 NOTE — Anesthesia Procedure Notes (Signed)
Procedure Name: MAC Date/Time: 05/11/2022 9:45 AM  Performed by: Griffin Dakin, CRNAPre-anesthesia Checklist: Patient identified, Emergency Drugs available, Suction available, Patient being monitored and Timeout performed Patient Re-evaluated:Patient Re-evaluated prior to induction Oxygen Delivery Method: Simple face mask Induction Type: IV induction Airway Equipment and Method: Oral airway Placement Confirmation: positive ETCO2 and breath sounds checked- equal and bilateral Dental Injury: Teeth and Oropharynx as per pre-operative assessment

## 2022-05-11 NOTE — Telephone Encounter (Signed)
Pharmacy Patient Advocate Encounter  Insurance verification completed.    The patient is insured through [facility]   The patient is currently admitted and ran test claims for the following: Eliquis.  Copays and coinsurance results were relayed to Inpatient clinical team.

## 2022-05-11 NOTE — Transfer of Care (Signed)
Immediate Anesthesia Transfer of Care Note  Patient: Brian Klein  Procedure(s) Performed: Transcatheter Aortic Valve Replacement, Transfemoral INTRAOPERATIVE TRANSTHORACIC ECHOCARDIOGRAM  Patient Location: cath lab  Anesthesia Type:MAC  Level of Consciousness: awake, alert  and oriented  Airway & Oxygen Therapy: Patient Spontanous Breathing  Post-op Assessment: Report given to RN and Post -op Vital signs reviewed and stable  Post vital signs: Reviewed and stable  Last Vitals:  Vitals Value Taken Time  BP 127/75 05/11/22 1141  Temp    Pulse 37 05/11/22 1143  Resp 15 05/11/22 1143  SpO2 99 % 05/11/22 1143  Vitals shown include unvalidated device data.  Last Pain:  Vitals:   05/11/22 1125  TempSrc:   PainSc: 0-No pain         Complications: There were no known notable events for this encounter.

## 2022-05-11 NOTE — Interval H&P Note (Signed)
History and Physical Interval Note:  05/11/2022 6:40 AM  Brian Klein  has presented today for surgery, with the diagnosis of Severe Aortic Stenosis.  The various methods of treatment have been discussed with the patient and family. After consideration of risks, benefits and other options for treatment, the patient has consented to  Procedure(s): Transcatheter Aortic Valve Replacement, Transfemoral (N/A) INTRAOPERATIVE TRANSTHORACIC ECHOCARDIOGRAM (N/A) as a surgical intervention.  The patient's history has been reviewed, patient examined, no change in status, stable for surgery.  I have reviewed the patient's chart and labs.  Questions were answered to the patient's satisfaction.     Pierre Bali Corbin Hott

## 2022-05-11 NOTE — Discharge Instructions (Addendum)
ACTIVITY AND EXERCISE  Daily activity and exercise are an important part of your recovery. People recover at different rates depending on their general health and type of valve procedure.  Most people recovering from TAVR feel better relatively quickly   No lifting, pushing, pulling more than 10 pounds (examples to avoid: groceries, vacuuming, gardening, golfing):             - For one week with a procedure through the groin.             - For six weeks for procedures through the chest wall or neck. NOTE: You will typically see one of our providers 7-14 days after your procedure to discuss WHEN TO RESUME the above activities.      DRIVING  Do not drive until you are seen for follow up and cleared by a provider. Generally, we ask patient to not drive for 1 week after their procedure.  If you have been told by your doctor in the past that you may not drive, you must talk with him/her before you begin driving again.   DRESSING  Groin site: you may leave the clear dressing over the site for up to one week or until it falls off.   HYGIENE  If you had a femoral (leg) procedure, you may take a shower when you return home. After the shower, pat the site dry. Do NOT use powder, oils or lotions in your groin area until the site has completely healed.  If you had a chest procedure, you may shower when you return home unless specifically instructed not to by your discharging practitioner.             - DO NOT scrub incision; pat dry with a towel.             - DO NOT apply any lotions, oils, powders to the incision.             - No tub baths / swimming for at least 2 weeks.  If you notice any fevers, chills, increased pain, swelling, bleeding or pus, please contact your doctor.   ADDITIONAL INFORMATION  If you are going to have an upcoming dental procedure, please contact our office as you will require antibiotics ahead of time to prevent infection on your heart valve.    If you have any questions  or concerns you can call the structural heart phone during normal business hours 8am-4pm. If you have an urgent need after hours or weekends please call [phone] to talk to the on call provider for general cardiology. If you have an emergency that requires immediate attention, please call 911.    After TAVR Checklist  Check  Test Description   Follow up appointment in 1-2 weeks  You will see our structural heart advanced practice provider. Your incision sites will be checked and you will be cleared to drive and resume all normal activities if you are doing well.     1 month echo and follow up  You will have an echo to check on your new heart valve and be seen back in the office by a structural heart advanced practice provider.   Follow up with your primary cardiologist You will need to be seen by your primary cardiologist in the following 3-6 months after your 1 month appointment in the valve clinic.    1 year echo and follow up You will have another echo to check on your heart valve after 1 year   and be seen back in the office by a structural heart advanced practice provider. This your last structural heart visit.   Bacterial endocarditis prophylaxis  You will have to take antibiotics for the rest of your life before all dental procedures (even teeth cleanings) to protect your heart valve. Antibiotics are also required before some surgeries. Please check with your cardiologist before scheduling any surgeries. Also, please make sure to tell us if you have a penicillin allergy as you will require an alternative antibiotic.   ' =======================================  Information on my medicine - ELIQUIS (apixaban)  This medication education was reviewed with me or my healthcare representative as part of my discharge preparation.     Why was Eliquis prescribed for you? Eliquis was prescribed for you to reduce the risk of a blood clot forming that can cause a stroke if you have a medical  condition called atrial fibrillation (a type of irregular heartbeat).  What do You need to know about Eliquis ? Take your Eliquis TWICE DAILY - one tablet in the morning and one tablet in the evening with or without food. If you have difficulty swallowing the tablet whole please discuss with your pharmacist how to take the medication safely.  Take Eliquis exactly as prescribed by your doctor and DO NOT stop taking Eliquis without talking to the doctor who prescribed the medication.  Stopping may increase your risk of developing a stroke.  Refill your prescription before you run out.  After discharge, you should have regular check-up appointments with your healthcare provider that is prescribing your Eliquis.  In the future your dose may need to be changed if your kidney function or weight changes by a significant amount or as you get older.  What do you do if you miss a dose? If you miss a dose, take it as soon as you remember on the same day and resume taking twice daily.  Do not take more than one dose of ELIQUIS at the same time to make up a missed dose.  Important Safety Information A possible side effect of Eliquis is bleeding. You should call your healthcare provider right away if you experience any of the following: Bleeding from an injury or your nose that does not stop. Unusual colored urine (red or dark brown) or unusual colored stools (red or black). Unusual bruising for unknown reasons. A serious fall or if you hit your head (even if there is no bleeding).  Some medicines may interact with Eliquis and might increase your risk of bleeding or clotting while on Eliquis. To help avoid this, consult your healthcare provider or pharmacist prior to using any new prescription or non-prescription medications, including herbals, vitamins, non-steroidal anti-inflammatory drugs (NSAIDs) and supplements.  This website has more information on Eliquis (apixaban):  http://www.eliquis.com/eliquis/home

## 2022-05-11 NOTE — Progress Notes (Signed)
Mobility Specialist Progress Note:   05/11/22 1629  Mobility  Activity Ambulated with assistance in hallway  Level of Assistance Standby assist, set-up cues, supervision of patient - no hands on  Assistive Device None  Distance Ambulated (ft) 250 ft  Activity Response Tolerated well  $Mobility charge 1 Mobility   Pre- Mobility:   50 HR;  114/69 BP Post Mobility:   61 HR;  125/68 BP; 100% SpO2  Pt received in bed willing to participate in mobility. R groin site with a small amount of blood, RN aware. Complaints of some soreness in groins. Left in bed with call bell in reach and all needs met.   [facility] Surveyor, mining Chat only

## 2022-05-12 ENCOUNTER — Inpatient Hospital Stay (HOSPITAL_COMMUNITY): Payer: Medicare PPO

## 2022-05-12 ENCOUNTER — Other Ambulatory Visit (HOSPITAL_COMMUNITY): Payer: Self-pay

## 2022-05-12 DIAGNOSIS — Z952 Presence of prosthetic heart valve: Secondary | ICD-10-CM

## 2022-05-12 DIAGNOSIS — I350 Nonrheumatic aortic (valve) stenosis: Secondary | ICD-10-CM | POA: Diagnosis not present

## 2022-05-12 DIAGNOSIS — I10 Essential (primary) hypertension: Secondary | ICD-10-CM | POA: Diagnosis not present

## 2022-05-12 DIAGNOSIS — Z006 Encounter for examination for normal comparison and control in clinical research program: Secondary | ICD-10-CM | POA: Diagnosis not present

## 2022-05-12 DIAGNOSIS — I4821 Permanent atrial fibrillation: Secondary | ICD-10-CM | POA: Diagnosis not present

## 2022-05-12 LAB — BASIC METABOLIC PANEL
Anion gap: 9 (ref 5–15)
BUN: 13 mg/dL (ref 8–23)
CO2: 22 mmol/L (ref 22–32)
Calcium: 8.9 mg/dL (ref 8.9–10.3)
Chloride: 106 mmol/L (ref 98–111)
Creatinine, Ser: 1.08 mg/dL (ref 0.61–1.24)
GFR, Estimated: >60 mL/min (ref >60)
Glucose, Bld: 93 mg/dL (ref 70–99)
Potassium: 3.6 mmol/L (ref 3.5–5.1)
Sodium: 137 mmol/L (ref 135–145)

## 2022-05-12 LAB — ECHOCARDIOGRAM COMPLETE
AR max vel: 2.76 cm2
AV Area VTI: 3.16 cm2
AV Area mean vel: 3.1 cm2
AV Mean grad: 8 mmHg
AV Peak grad: 16.8 mmHg
Ao pk vel: 2.05 m/s
Height: 66 in
S' Lateral: 2.85 cm
Weight: 2550.4 oz

## 2022-05-12 LAB — CBC
HCT: 39.2 % (ref 39.0–52.0)
Hemoglobin: 12.9 g/dL — ABNORMAL LOW (ref 13.0–17.0)
MCH: 25.8 pg — ABNORMAL LOW (ref 26.0–34.0)
MCHC: 32.9 g/dL (ref 30.0–36.0)
MCV: 78.4 fL — ABNORMAL LOW (ref 80.0–100.0)
Platelets: 100 10*3/uL — ABNORMAL LOW (ref 150–400)
RBC: 5 MIL/uL (ref 4.22–5.81)
RDW: 14.5 % (ref 11.5–15.5)
WBC: 11 10*3/uL — ABNORMAL HIGH (ref 4.0–10.5)
nRBC: 0 % (ref 0.0–0.2)

## 2022-05-12 LAB — MAGNESIUM: Magnesium: 1.9 mg/dL (ref 1.7–2.4)

## 2022-05-12 MED ORDER — APIXABAN 5 MG PO TABS: 5.0000 mg | ORAL_TABLET | Freq: Two times a day (BID) | ORAL | 11 refills | Status: DC

## 2022-05-12 MED ORDER — APIXABAN 5 MG PO TABS
5.0000 mg | ORAL_TABLET | Freq: Two times a day (BID) | ORAL | 4 refills | Status: DC
  Filled 2022-05-12: qty 60, 30d supply, fill #0

## 2022-05-12 NOTE — Anesthesia Postprocedure Evaluation (Signed)
Anesthesia Post Note  Patient: Noreene Larsson III  Procedure(s) Performed: Transcatheter Aortic Valve Replacement, Transfemoral INTRAOPERATIVE TRANSTHORACIC ECHOCARDIOGRAM     Patient location during evaluation: PACU Anesthesia Type: MAC Level of consciousness: awake and alert Pain management: pain level controlled Vital Signs Assessment: post-procedure vital signs reviewed and stable Respiratory status: spontaneous breathing, nonlabored ventilation, respiratory function stable and patient connected to nasal cannula oxygen Cardiovascular status: stable and blood pressure returned to baseline Postop Assessment: no apparent nausea or vomiting Anesthetic complications: no   There were no known notable events for this encounter.  Last Vitals:  Vitals:   05/12/22 0400 05/12/22 0836  BP: 113/69 123/84  Pulse: 90 95  Resp: (!) 23 16  Temp: 37.2 C 37.1 C  SpO2: 96% 98%    Last Pain:  Vitals:   05/12/22 0836  TempSrc: Oral  PainSc:                  March Rummage Elbridge Magowan

## 2022-05-12 NOTE — Progress Notes (Deleted)
[facility] [place]                                     Cardiology Office Note:    Date:  05/12/2022   ID:  [name], DOB 18-Apr-1946, MRN [id number]  PCP:  Crist Infante, MD  [facility] Cardiologist:  Candee Furbish, MD  [place] Electrophysiologist:  None   Referring MD: Crist Infante, MD   No chief complaint on file. ***  History of Present Illness:    Brian Klein is a 76 y.o. male with a hx of chronic afib on Xarelto, HTN, HLD, osteosarcoma of jaw s/p remote resection/chemo, mild MR and paradoxical LFLG AS who presented to [facility] on 05/11/22 for planned TAVR.    He was seen by Dr. Marlou Porch in 05/2021 and reported some shortness of breath with moderate activity. Echocardiogram 02/2022 showed EF 55% and severe LFLG AS with a mean gradient of 22 mm hg, AVA 0.67cm2, DVI 0.23, SVI 29 as well as mild AI/MR. [facility] 03/26/22 showed mild to moderate obstructive coronary artery disease.    He was evaluated by the multidisciplinary valve team and felt to have severe, symptomatic aortic stenosis and to be a suitable candidate for TAVR, which was set up for 05/11/22.    Severe AS: s/p successful TAVR with a 26 mm Edwards Sapien 3 Ultra Resilia  THV via the TF approach on 05/11/22. Post operative echo pending. Groin sites are stable. ECG with rate controlled AF with PVC and no high grade heart block. Home Xarelto switched to Eliquis '5mg'$  BID given increased bleeding risk with Xarelto and results of the [facility] trial. Plan for discharge home today with close follow up in the office next week. Post discharge instructions reviewed with understanding. Will nee lifelong SBE however this will be discussed at follow up next week.    Chronic afib: Rate well controlled. Resume home Toprol XL. As above, Xarelto switched to Eliquis '5mg'$  BID.    HTN: BP well controlled. Resume home meds.  ____________   Past Medical History:  Diagnosis Date    Abnormal CT scan 11/26/2015   Atrial fibrillation (HCC)    Atrial fibrillation, chronic ([place]) 11/26/2015   AVD (aortic valve disease)    with bicupsid aortic valve   Chronic anticoagulation 11/26/2015   Erectile dysfunction 01/10/2013   Hypercholesterolemia 12/16/2010   Hyperlipidemia    Hypertension    MVP (mitral valve prolapse)    Osteogenic sarcoma ([place]) 1976   jaw bone   S/P TAVR (transcatheter aortic valve replacement) 05/11/2022   s/p TAVR with a 26 mm Edwards S3UR via the TF appoach by Dr. Ali Lowe & Dr. Tenny Craw   Seasonal allergies 11/19/2014    Past Surgical History:  Procedure Laterality Date   EPIGASTRIC HERNIA REPAIR N/A 05/19/2020   Procedure: OPEN EPIGASTRIC HERNIA REPAIR WITH MESH;  Surgeon: Kinsinger, Arta Bruce, MD;  Location: [facility];  Service: General;  Laterality: N/A;   EYELID LACERATION REPAIR  2008   left and right   INTRAOPERATIVE TRANSTHORACIC ECHOCARDIOGRAM N/A 05/11/2022   Procedure: INTRAOPERATIVE TRANSTHORACIC ECHOCARDIOGRAM;  Surgeon: Early Osmond, MD;  Location: [place] [facility];  Service: Open Heart Surgery;  Laterality: N/A;   MANDIBLE SURGERY  1976   right removal -- cancer   MANDIBLE SURGERY  1987   right reconstruction   RIGHT/LEFT HEART CATH  AND CORONARY ANGIOGRAPHY N/A 03/26/2022   Procedure: RIGHT/LEFT HEART CATH AND CORONARY ANGIOGRAPHY;  Surgeon: Early Osmond, MD;  Location: [place] CV LAB;  Service: Cardiovascular;  Laterality: N/A;   TRANSCATHETER AORTIC VALVE REPLACEMENT, TRANSFEMORAL N/A 05/11/2022   Procedure: Transcatheter Aortic Valve Replacement, Transfemoral;  Surgeon: Early Osmond, MD;  Location: [place] CV LAB;  Service: Open Heart Surgery;  Laterality: N/A;   TRANSTHORACIC ECHOCARDIOGRAM  07/18/2002   ef 71%    Current Medications: No outpatient medications have been marked as taking for the 05/17/22 encounter (Appointment) with [facility].     Allergies:   Crestor [rosuvastatin]    Social History   Socioeconomic History   Marital status: Married    Spouse name: [name]   Number of children: 2   Years of education: Not on file   Highest education level: Not on file  Occupational History   Occupation: retired  Tobacco Use   Smoking status: Never   Smokeless tobacco: Never  Vaping Use   Vaping Use: Never used  Substance and Sexual Activity   Alcohol use: No   Drug use: No   Sexual activity: Not on file  Other Topics Concern   Not on file  Social History Narrative   Not on file   Social Determinants of Health   Financial Resource Strain: Not on file  Food Insecurity: Not on file  Transportation Needs: Not on file  Physical Activity: Not on file  Stress: Not on file  Social Connections: Not on file     Family History: The patient's family history includes Heart attack in his father; Hypertension in his father and sister; Stroke in his mother. There is no history of Colon cancer, Stomach cancer, Rectal cancer, or Esophageal cancer.  ROS:   Please see the history of present illness.    All other systems reviewed and are negative.  EKGs/Labs/Other Studies Reviewed:    The following studies were reviewed today:   TAVR OPERATIVE NOTE     Date of Procedure:                05/11/2022   Preoperative Diagnosis:      Severe Aortic Stenosis    Postoperative Diagnosis:    Same    Procedure:        Transcatheter Aortic Valve Replacement - Transfemoral Approach             Edwards Sapien 3 Resilia THV (size 26 mm, model # 9755RLS, serial # [id number])              Co-Surgeons:                         Justice Rocher, MD and Lenna Sciara, MD Anesthesiologist:                  Rochele Pages, DO   Echocardiographer:              Rudean Haskell, MD   Pre-operative Echo Findings: Severe aortic stenosis Normal left ventricular systolic function   Post-operative Echo Findings: Trace paravalvular leak Normal left ventricular systolic  function   Left Heart Catheterization Findings: Left ventricular end-diastolic pressure of 75mHg _____________   Echo 05/12/22: Completed but pending formal read at the time of discharge      EKG:  EKG is *** ordered today.  The ekg ordered today demonstrates ***  Recent Labs: 05/07/2022: ALT 16 05/12/2022: BUN 13; Creatinine,  Ser 1.08; Hemoglobin 12.9; Magnesium 1.9; Platelets 100; Potassium 3.6; Sodium 137  Recent Lipid Panel    Component Value Date/Time   CHOL 113 04/22/2020 0900   TRIG 80 04/22/2020 0900   HDL 38 (L) 04/22/2020 0900   CHOLHDL 3.0 04/22/2020 0900   LDLCALC 59 04/22/2020 0900     Risk Assessment/Calculations:   {Does this patient have ATRIAL FIBRILLATION?:[id number]}   Physical Exam:    VS:  There were no vitals taken for this visit.    Wt Readings from Last 3 Encounters:  05/12/22 159 lb 6.4 oz (72.3 kg)  04/29/22 164 lb 1.8 oz (74.4 kg)  03/26/22 160 lb (72.6 kg)     GEN: *** Well nourished, well developed in no acute distress HEENT: Normal NECK: No JVD; No carotid bruits LYMPHATICS: No lymphadenopathy CARDIAC: ***RRR, no murmurs, rubs, gallops RESPIRATORY:  Clear to auscultation without rales, wheezing or rhonchi  ABDOMEN: Soft, non-tender, non-distended MUSCULOSKELETAL:  No edema; No deformity  SKIN: Warm and dry NEUROLOGIC:  Alert and oriented x 3 PSYCHIATRIC:  Normal affect   ASSESSMENT:    No diagnosis found. PLAN:    In order of problems listed above:       {Are you ordering a CV Procedure (e.g. stress test, cath, DCCV, TEE, etc)?   Press F2        :[id number]}    Medication Adjustments/Labs and Tests Ordered: Current medicines are reviewed at length with the patient today.  Concerns regarding medicines are outlined above.  No orders of the defined types were placed in this encounter.  No orders of the defined types were placed in this encounter.   There are no Patient Instructions on file for this visit.    Signed, Kathyrn Drown, NP  05/12/2022 10:59 AM    [place]

## 2022-05-12 NOTE — Progress Notes (Signed)
CARDIAC REHAB PHASE I      Pt dressed and ready for home. Ambulated well with mobility. Post TAVR education including risk factors, restrictions, site care, exercise guidelines, heart healthy diet and CRP2 reviewed. All questions and concerns addressed. Not interested in CRP2 at this time. Plan for home today.    [id number]   Vanessa Barbara, RN BSN 05/12/2022 10:31 AM

## 2022-05-12 NOTE — Progress Notes (Signed)
Echocardiogram 2D Echocardiogram has been performed.  [place] Deputy Marsella Suman RDCS 05/12/2022, 8:17 AM

## 2022-05-12 NOTE — Progress Notes (Signed)
Mobility Specialist Progress Note:   05/12/22 0938  Mobility  Activity Ambulated with assistance in hallway  Level of Assistance Modified independent, requires aide device or extra time  Assistive Device None  Distance Ambulated (ft) 350 ft  Activity Response Tolerated well  $Mobility charge 1 Mobility   Pt received in bed willing to participate in mobility. Complaints of groin soreness. Left EOB with call bell in reach and all needs met.   [facility] Surveyor, mining Chat only

## 2022-05-12 NOTE — Progress Notes (Signed)
Patient given discharge instructions, all questions answered and patient and spouse verbalize understanding.  PIV x2 removed and patient awaiting medications from [place].

## 2022-05-13 ENCOUNTER — Encounter (HOSPITAL_BASED_OUTPATIENT_CLINIC_OR_DEPARTMENT_OTHER): Payer: Self-pay | Admitting: Emergency Medicine

## 2022-05-13 ENCOUNTER — Telehealth: Payer: Self-pay | Admitting: Cardiology

## 2022-05-13 ENCOUNTER — Inpatient Hospital Stay (HOSPITAL_BASED_OUTPATIENT_CLINIC_OR_DEPARTMENT_OTHER)
Admission: EM | Admit: 2022-05-13 | Discharge: 2022-05-18 | DRG: 243 | Disposition: A | Discharge status: Home / Self Care | Payer: Medicare PPO | Attending: Internal Medicine | Admitting: Internal Medicine

## 2022-05-13 ENCOUNTER — Telehealth: Payer: Self-pay | Admitting: Internal Medicine

## 2022-05-13 ENCOUNTER — Emergency Department (HOSPITAL_BASED_OUTPATIENT_CLINIC_OR_DEPARTMENT_OTHER): Payer: Medicare PPO

## 2022-05-13 DIAGNOSIS — Z7901 Long term (current) use of anticoagulants: Secondary | ICD-10-CM | POA: Diagnosis not present

## 2022-05-13 DIAGNOSIS — Z79899 Other long term (current) drug therapy: Secondary | ICD-10-CM

## 2022-05-13 DIAGNOSIS — Y848 Other medical procedures as the cause of abnormal reaction of the patient, or of later complication, without mention of misadventure at the time of the procedure: Secondary | ICD-10-CM | POA: Diagnosis present

## 2022-05-13 DIAGNOSIS — Z823 Family history of stroke: Secondary | ICD-10-CM

## 2022-05-13 DIAGNOSIS — I4821 Permanent atrial fibrillation: Secondary | ICD-10-CM | POA: Diagnosis present

## 2022-05-13 DIAGNOSIS — I10 Essential (primary) hypertension: Secondary | ICD-10-CM | POA: Diagnosis present

## 2022-05-13 DIAGNOSIS — I2510 Atherosclerotic heart disease of native coronary artery without angina pectoris: Secondary | ICD-10-CM | POA: Diagnosis present

## 2022-05-13 DIAGNOSIS — Z953 Presence of xenogenic heart valve: Secondary | ICD-10-CM

## 2022-05-13 DIAGNOSIS — R55 Syncope and collapse: Secondary | ICD-10-CM | POA: Diagnosis not present

## 2022-05-13 DIAGNOSIS — Z006 Encounter for examination for normal comparison and control in clinical research program: Secondary | ICD-10-CM | POA: Diagnosis not present

## 2022-05-13 DIAGNOSIS — Z952 Presence of prosthetic heart valve: Secondary | ICD-10-CM | POA: Diagnosis not present

## 2022-05-13 DIAGNOSIS — R42 Dizziness and giddiness: Secondary | ICD-10-CM

## 2022-05-13 DIAGNOSIS — I447 Left bundle-branch block, unspecified: Secondary | ICD-10-CM | POA: Diagnosis present

## 2022-05-13 DIAGNOSIS — Z888 Allergy status to other drugs, medicaments and biological substances status: Secondary | ICD-10-CM | POA: Diagnosis not present

## 2022-05-13 DIAGNOSIS — Z8249 Family history of ischemic heart disease and other diseases of the circulatory system: Secondary | ICD-10-CM | POA: Diagnosis not present

## 2022-05-13 DIAGNOSIS — I459 Conduction disorder, unspecified: Secondary | ICD-10-CM | POA: Diagnosis present

## 2022-05-13 DIAGNOSIS — Z85830 Personal history of malignant neoplasm of bone: Secondary | ICD-10-CM | POA: Diagnosis not present

## 2022-05-13 DIAGNOSIS — I350 Nonrheumatic aortic (valve) stenosis: Secondary | ICD-10-CM | POA: Diagnosis not present

## 2022-05-13 DIAGNOSIS — I97190 Other postprocedural cardiac functional disturbances following cardiac surgery: Principal | ICD-10-CM | POA: Diagnosis present

## 2022-05-13 DIAGNOSIS — Z950 Presence of cardiac pacemaker: Secondary | ICD-10-CM | POA: Diagnosis not present

## 2022-05-13 DIAGNOSIS — E7800 Pure hypercholesterolemia, unspecified: Secondary | ICD-10-CM | POA: Diagnosis present

## 2022-05-13 DIAGNOSIS — Z9221 Personal history of antineoplastic chemotherapy: Secondary | ICD-10-CM

## 2022-05-13 DIAGNOSIS — M954 Acquired deformity of chest and rib: Secondary | ICD-10-CM | POA: Diagnosis not present

## 2022-05-13 DIAGNOSIS — I083 Combined rheumatic disorders of mitral, aortic and tricuspid valves: Secondary | ICD-10-CM | POA: Diagnosis not present

## 2022-05-13 LAB — COMPREHENSIVE METABOLIC PANEL
ALT: 13 U/L (ref 0–44)
AST: 28 U/L (ref 15–41)
Albumin: 4 g/dL (ref 3.5–5.0)
Alkaline Phosphatase: 62 U/L (ref 38–126)
Anion gap: 5 (ref 5–15)
BUN: 13 mg/dL (ref 8–23)
CO2: 25 mmol/L (ref 22–32)
Calcium: 8.4 mg/dL — ABNORMAL LOW (ref 8.9–10.3)
Chloride: 105 mmol/L (ref 98–111)
Creatinine, Ser: 1.02 mg/dL (ref 0.61–1.24)
GFR, Estimated: >60 mL/min (ref >60)
Glucose, Bld: 128 mg/dL — ABNORMAL HIGH (ref 70–99)
Potassium: 3.7 mmol/L (ref 3.5–5.1)
Sodium: 135 mmol/L (ref 135–145)
Total Bilirubin: 1 mg/dL (ref 0.3–1.2)
Total Protein: 7.6 g/dL (ref 6.5–8.1)

## 2022-05-13 LAB — CBC
HCT: 37 % — ABNORMAL LOW (ref 39.0–52.0)
Hemoglobin: 12.4 g/dL — ABNORMAL LOW (ref 13.0–17.0)
MCH: 26.1 pg (ref 26.0–34.0)
MCHC: 33.5 g/dL (ref 30.0–36.0)
MCV: 77.9 fL — ABNORMAL LOW (ref 80.0–100.0)
Platelets: 78 10*3/uL — ABNORMAL LOW (ref 150–400)
RBC: 4.75 MIL/uL (ref 4.22–5.81)
RDW: 14.6 % (ref 11.5–15.5)
WBC: 10.4 10*3/uL (ref 4.0–10.5)
nRBC: 0 % (ref 0.0–0.2)

## 2022-05-13 LAB — TROPONIN I (HIGH SENSITIVITY)
Troponin I (High Sensitivity): 56 ng/L — ABNORMAL HIGH (ref <18)
Troponin I (High Sensitivity): 53 ng/L — ABNORMAL HIGH (ref <18)

## 2022-05-13 MED FILL — Lidocaine HCl Local Preservative Free (PF) Inj 1%: INTRAMUSCULAR | Qty: 30 | Status: AC

## 2022-05-13 MED FILL — Nitroglycerin IV Soln 100 MCG/ML in D5W: INTRA_ARTERIAL | Qty: 10 | Status: AC

## 2022-05-13 NOTE — Telephone Encounter (Signed)
Was in kitchen, had just eaten, walking to the sink he got dizzy which worsened quickly.  He stooped to floor as he felt he may pass out.  Got hot, it passed.  Feels better right now.  Slept 8-9 hrs last night and woke up feeling better.  While on phone BP 135/86, 74  No metoprolol or olmesartan taken today.    Also wanted to reports he gets ocular migraines.  No headaches, but affects vision.  Occurs once every 2 months.  Has gotten 12 of them since having surgery.  He lays down and it goes away pretty quickly - in about 5 min.  Pt aware I will forward message to the structural heart team and someone will give him a call back.

## 2022-05-13 NOTE — ED Notes (Addendum)
Pt is not complaining of any dizziness or nausea.  States it is mostly comes on when he is up moving around.  Pt had aortic valve replacement 41/74 with no complications.  Left groin op site WNLS pt has inguinal hernia present and slight bruising noted.  Dsg intact

## 2022-05-13 NOTE — Telephone Encounter (Signed)
I spoke with patient's wife. She reports patient's PCP called to check on him this afternoon and advised patient to go to ED due to his symptoms.  [facility] [address].

## 2022-05-13 NOTE — Telephone Encounter (Addendum)
  [place] VALVE TEAM   Patient contacted regarding discharge from [facility] on 05/12/22. Patient reports that he is overall well with no groin issues. He had an episode of dizziness early this morning shortly after waking that was reported as similar to prior vertigo with associated diaphoresis. Symptoms were short in duration and he has felt good since that time with no recurrent episodes. Plan to continue to monitor for now and if recurrence, he will call back or go to ED for further evaluation.   Patient understands to follow up with APP provider on 05/17/22  Patient understands discharge instructions? Yes Patient understands medications and regimen? Yes  Patient understands to bring all medications to this visit? Yes   Kathyrn Drown NP-C Structural Heart Team  Pager: [phone]  Addend: Contacted by Triage RN stating that the patient had another episode this evening. PCP happened to call shortly after episode and recommended proceeding to the ED. I agree with this plan as I worry about late onset conduction issues s/p TAVR.

## 2022-05-13 NOTE — ED Notes (Signed)
Orthostatic vitals completed per order. Pt reports no dizziness.

## 2022-05-13 NOTE — ED Triage Notes (Signed)
Pt c/o intermittent dizziness and nausea since this morning. Reports TAVR procedure 2 days ago through left groin. Dizziness occurs while standing.

## 2022-05-13 NOTE — ED Notes (Signed)
ED TO INPATIENT HANDOFF REPORT  ED Nurse Name and Phone #: April Holding [phone]  S Name/Age/Gender Brian Klein 76 y.o. male Room/Bed: MH12/MH12  Code Status   Code Status: Prior  Home/SNF/Other Home Patient oriented to: self, place, time, and situation Is this baseline? Yes   Triage Complete: Triage complete  Chief Complaint Postural dizziness with presyncope [R42, R55]  Triage Note Pt c/o intermittent dizziness and nausea since this morning. Reports TAVR procedure 2 days ago through left groin. Dizziness occurs while standing.    Allergies Allergies  Allergen Reactions   Crestor [Rosuvastatin]     Legs aches and pain     Level of Care/Admitting Diagnosis ED Disposition     ED Disposition  Admit   Condition  --   Comment  Hospital Area: [place] [[id number]]  Level of Care: Telemetry Cardiac [[id number]]  Interfacility transfer: Yes  May place patient in observation at [facility] or [place] if equivalent level of care is available:: No  Covid Evaluation: Asymptomatic - no recent exposure (last 10 days) testing not required  Diagnosis: Postural dizziness with presyncope [[id number]]  Admitting Physician: Minus Breeding [phone]  Attending Physician: Fransico Meadow [[id number]]          B Medical/Surgery History Past Medical History:  Diagnosis Date   Abnormal CT scan 11/26/2015   Atrial fibrillation ([place])    Atrial fibrillation, chronic ([place]) 11/26/2015   AVD (aortic valve disease)    with bicupsid aortic valve   Chronic anticoagulation 11/26/2015   Erectile dysfunction 01/10/2013   Hypercholesterolemia 12/16/2010   Hyperlipidemia    Hypertension    MVP (mitral valve prolapse)    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 Ali Lowe & Dr. Tenny Craw   Seasonal allergies 11/19/2014   Past Surgical History:   Procedure Laterality Date   EPIGASTRIC HERNIA REPAIR N/A 05/19/2020   Procedure: OPEN EPIGASTRIC HERNIA REPAIR WITH MESH;  Surgeon: Kinsinger, Arta Bruce, MD;  Location: [facility];  Service: General;  Laterality: N/A;   EYELID LACERATION REPAIR  2008   left and right   INTRAOPERATIVE TRANSTHORACIC ECHOCARDIOGRAM N/A 05/11/2022   Procedure: INTRAOPERATIVE TRANSTHORACIC ECHOCARDIOGRAM;  Surgeon: Early Osmond, MD;  Location: [place] [facility];  Service: Open Heart Surgery;  Laterality: N/A;   MANDIBLE SURGERY  1976   right removal -- cancer   MANDIBLE SURGERY  1987   right reconstruction   RIGHT/LEFT HEART CATH AND CORONARY ANGIOGRAPHY N/A 03/26/2022   Procedure: RIGHT/LEFT HEART CATH AND CORONARY ANGIOGRAPHY;  Surgeon: Early Osmond, MD;  Location: [place] [facility];  Service: Cardiovascular;  Laterality: N/A;   TRANSCATHETER AORTIC VALVE REPLACEMENT, TRANSFEMORAL N/A 05/11/2022   Procedure: Transcatheter Aortic Valve Replacement, Transfemoral;  Surgeon: Early Osmond, MD;  Location: [place] [facility];  Service: Open Heart Surgery;  Laterality: N/A;   TRANSTHORACIC ECHOCARDIOGRAM  07/18/2002   ef 71%     A IV Location/Drains/Wounds Patient Lines/Drains/Airways Status     Active Line/Drains/Airways     Name Placement date Placement time Site Days   Peripheral IV 05/13/22 20 G 1" Anterior;Right;Upper Arm 05/13/22  1949  Arm  less than 1            Intake/Output Last 24 hours No intake or output data in the 24 hours ending 05/13/22 2320  Labs/Imaging  Results for orders placed or performed during the hospital encounter of 05/13/22 (from the past 48 hour(s))  CBC     Status: Abnormal   Collection Time: 05/13/22  5:40 PM  Result Value Ref Range   WBC 10.4 4.0 - 10.5 K/uL   RBC 4.75 4.22 - 5.81 MIL/uL   Hemoglobin 12.4 (L) 13.0 - 17.0 g/dL   HCT 37.0 (L) 39.0 - 52.0 %   MCV 77.9 (L) 80.0 - 100.0 fL   MCH 26.1 26.0 - 34.0 pg   MCHC 33.5 30.0 - 36.0 g/dL   RDW 14.6 11.5  - 15.5 %   Platelets 78 (L) 150 - 400 K/uL    Comment: Immature Platelet Fraction may be clinically indicated, consider ordering this additional test [id number] PLATELET COUNT CONFIRMED BY SMEAR PLATELETS APPEAR DECREASED    nRBC 0.0 0.0 - 0.2 %    Comment: Performed at [facility], [address] [place], [place] [address]  Comprehensive metabolic panel     Status: Abnormal   Collection Time: 05/13/22  5:40 PM  Result Value Ref Range   Sodium 135 135 - 145 mmol/L   Potassium 3.7 3.5 - 5.1 mmol/L   Chloride 105 98 - 111 mmol/L   CO2 25 22 - 32 mmol/L   Glucose, Bld 128 (H) 70 - 99 mg/dL    Comment: Glucose reference range applies only to samples taken after fasting for at least 8 hours.   BUN 13 8 - 23 mg/dL   Creatinine, Ser 1.02 0.61 - 1.24 mg/dL   Calcium 8.4 (L) 8.9 - 10.3 mg/dL   Total Protein 7.6 6.5 - 8.1 g/dL   Albumin 4.0 3.5 - 5.0 g/dL   AST 28 15 - 41 U/L   ALT 13 0 - 44 U/L   Alkaline Phosphatase 62 38 - 126 U/L   Total Bilirubin 1.0 0.3 - 1.2 mg/dL   GFR, Estimated >60 >60 mL/min    Comment: (NOTE) Calculated using the CKD-EPI Creatinine Equation (2021)    Anion gap 5 5 - 15    Comment: Performed at [facility], [address] [place], [place] [address]  Troponin I (High Sensitivity)     Status: Abnormal   Collection Time: 05/13/22  5:40 PM  Result Value Ref Range   Troponin I (High Sensitivity) 56 (H) <18 ng/L    Comment: (NOTE) Elevated high sensitivity troponin I (hsTnI) values and significant  changes across serial measurements may suggest ACS but many other  chronic and acute conditions are known to elevate hsTnI results.  Refer to the "Links" section for chest pain algorithms and additional  guidance. Performed at [facility], [address] [place], [place] [address]   Troponin I (High Sensitivity)     Status: Abnormal   Collection Time: 05/13/22  7:50 PM  Result Value Ref Range   Troponin I (High Sensitivity)  53 (H) <18 ng/L    Comment: (NOTE) Elevated high sensitivity troponin I (hsTnI) values and significant  changes across serial measurements may suggest ACS but many other  chronic and acute conditions are known to elevate hsTnI results.  Refer to the "Links" section for chest pain algorithms and additional  guidance. Performed at [facility], [address], [place], [place] [address]    DG Chest 1 View  Result Date: 05/13/2022 CLINICAL DATA:  Dizziness, status post TAVR. EXAM: CHEST  1 VIEW COMPARISON:  05/07/2022 FINDINGS: TAVR  noted with expected orientation. 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 Upper normal heart size. Lungs appear clear without evidence of edema. No blunting of the costophrenic angles. IMPRESSION: 1. TAVR noted with expected orientation. 2. Upper normal heart size, without edema. 3. 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 Electronically Signed   By: Van Clines M.D.   On: 05/13/2022 19:57   CT Head Wo Contrast  Result Date: 05/13/2022 CLINICAL DATA:  Dizziness and nausea.  TAVR procedure today. EXAM: CT HEAD WITHOUT CONTRAST TECHNIQUE: Contiguous axial images were obtained from the base of the skull through the vertex without intravenous contrast. RADIATION DOSE REDUCTION: This exam was performed according to the departmental dose-optimization program which includes automated exposure control, adjustment of the mA and/or kV according to patient size and/or use of iterative reconstruction technique. COMPARISON:  MRI brain 01/24/2019 FINDINGS: Brain: Periventricular white matter and corona radiata hypodensities favor chronic ischemic microvascular white matter disease. Similar appearance shown on 01/24/2019. Otherwise, the brainstem, cerebellum, cerebral peduncles, thalamus, basal ganglia, basilar cisterns, and ventricular  system appear within normal limits. No intracranial hemorrhage, mass lesion, or acute CVA. Vascular: Unremarkable Skull: No calvarial abnormality observed. Sinuses/Orbits: Mild chronic ethmoid sinusitis. Other: Lucency with rim sclerosis place of the right mandibular condyle, possibly related to the patient's remote right mandibular reconstruction, correlate with operative history. IMPRESSION: 1. No acute intracranial findings. 2. Periventricular white matter and corona radiata hypodensities favor chronic ischemic microvascular white matter disease. 3. Mild chronic ethmoid sinusitis. 4. Lucency with rim sclerosis place of the right mandibular condyle, possibly related to the patient's remote right mandibular reconstruction, correlate with operative history. Electronically Signed   By: Van Clines M.D.   On: 05/13/2022 19:54   ECHOCARDIOGRAM COMPLETE  Result Date: 05/12/2022    ECHOCARDIOGRAM REPORT   Patient Name:   Brian Klein Date of Exam: 05/12/2022 Medical Rec #:  [id number]         Height:       66.0 in Accession #:    [id number]        Weight:       159.4 lb Date of Birth:  Nov 27, 1945         BSA:          1.816 m Patient Age:    79 years          BP:           113/69 mmHg Patient Gender: M                 HR:           94 bpm. Exam Location:  Inpatient Procedure: 2D Echo, Color Doppler and Cardiac Doppler Indications:    Post TAVR Evaluation z95.2  History:        Patient has prior history of Echocardiogram examinations, most                 recent 05/11/2022. Arrythmias:Atrial Fibrillation; Risk                 Factors:Hypertension and Dyslipidemia. 05/11/22 38m Edwards S3U                 TAVR Implanted.  Sonographer:    ERaquel SarnaSenior RDCS Referring Phys: [id number] 1. The aortic valve has been replaced by a 26 mm Sapien valve. Aortic  valve regurgitation is mild at most and varies by R-R interval, with a 2D VCA of 0.06 cm2 at longest RR. It comprises < 10% of the  circumferential extend. Effective orifice area, by VTI measures 3.16 cm. Aortic valve mean gradient measures 8.0 mmHg. Peak gradient 17 mm Hg. Normal DVI  2. Left ventricular ejection fraction, by estimation, is 60 to 65%. The left ventricle has normal function. The left ventricle has no regional wall motion abnormalities. Left ventricular diastolic parameters are indeterminate.  3. Right ventricular systolic function is normal. The right ventricular size is normal. There is mildly elevated pulmonary artery systolic pressure.  4. Left atrial size was mildly dilated.  5. Right atrial size was severely dilated.  6. The mitral valve is degenerative. Mild mitral valve regurgitation. Moderate mitral annular calcification.  7. Tricuspid valve regurgitation is moderate. Comparison(s): Successful TAVR Placement with, at most, mild PVL. FINDINGS  Left Ventricle: Left ventricular ejection fraction, by estimation, is 60 to 65%. The left ventricle has normal function. The left ventricle has no regional wall motion abnormalities. The left ventricular internal cavity size was normal in size. There is  no left ventricular hypertrophy. Left ventricular diastolic parameters are indeterminate. Right Ventricle: The right ventricular size is normal. No increase in right ventricular wall thickness. Right ventricular systolic function is normal. There is mildly elevated pulmonary artery systolic pressure. The tricuspid regurgitant velocity is 2.91  m/s, and with an assumed right atrial pressure of 3 mmHg, the estimated right ventricular systolic pressure is 58.5 mmHg. Left Atrium: Left atrial size was mildly dilated. Right Atrium: Right atrial size was severely dilated. Pericardium: Trivial pericardial effusion is present. Mitral Valve: The mitral valve is degenerative in appearance. Moderate mitral annular calcification. Mild mitral valve regurgitation. Tricuspid Valve: The tricuspid valve is normal in structure. Tricuspid valve  regurgitation is moderate . No evidence of tricuspid stenosis. Aortic Valve: The aortic valve has been repaired/replaced. Aortic valve regurgitation is mild. Aortic valve mean gradient measures 8.0 mmHg. Aortic valve peak gradient measures 16.8 mmHg. Aortic valve area, by VTI measures 3.16 cm. Pulmonic Valve: The pulmonic valve was normal in structure. Pulmonic valve regurgitation is mild. No evidence of pulmonic stenosis. Aorta: The aortic root and ascending aorta are structurally normal, with no evidence of dilitation. IAS/Shunts: No atrial level shunt detected by color flow Doppler.  LEFT VENTRICLE PLAX 2D LVIDd:         4.55 cm LVIDs:         2.85 cm LV PW:         0.90 cm LV IVS:        0.85 cm LVOT diam:     2.38 cm LV SV:         101 LV SV Index:   56 LVOT Area:     4.45 cm  RIGHT VENTRICLE RV S prime:     13.90 cm/s TAPSE (M-mode): 1.8 cm LEFT ATRIUM             Index        RIGHT ATRIUM           Index LA diam:        4.30 cm 2.37 cm/m   RA Area:     27.90 cm LA Vol (A2C):   84.5 ml 46.53 ml/m  RA Volume:   94.20 ml  51.87 ml/m LA Vol (A4C):   62.1 ml 34.20 ml/m LA Biplane Vol: 71.9 ml 39.59 ml/m  AORTIC VALVE AV Area (Vmax):  2.76 cm AV Area (Vmean):   3.10 cm AV Area (VTI):     3.16 cm AV Vmax:           205.00 cm/s AV Vmean:          133.000 cm/s AV VTI:            0.320 m AV Peak Grad:      16.8 mmHg AV Mean Grad:      8.0 mmHg LVOT Vmax:         127.00 cm/s LVOT Vmean:        92.700 cm/s LVOT VTI:          0.227 m LVOT/AV VTI ratio: 0.71  AORTA Ao Root diam: 3.40 cm Ao Asc diam:  3.80 cm TRICUSPID VALVE TR Peak grad:   33.9 mmHg TR Vmax:        291.00 cm/s  SHUNTS Systemic VTI:  0.23 m Systemic Diam: 2.38 cm Rudean Haskell MD Electronically signed by Rudean Haskell MD Signature Date/Time: 05/12/2022/1:46:46 PM    Final     Pending Labs Unresulted Labs (From admission, onward)    None       Vitals/Pain Today's Vitals   05/13/22 1905 05/13/22 1910 05/13/22 2030  05/13/22 2257  BP: 133/80 137/76 139/83 108/75  Pulse: 67 65 61 (!) 56  Resp: 16 (!) 23 19 (!) 21  Temp:   98 F (36.7 C) 97.6 F (36.4 C)  TempSrc:   Oral Oral  SpO2: 100% 97% 100%   Weight:      Height:      PainSc:   0-No pain     Isolation Precautions No active isolations  Medications Medications - No data to display  Mobility walks Low fall risk   Focused Assessments N/A   R Recommendations: See Admitting Provider Note  Report given to:   Additional Notes: PT A&O, with saline lock, no complaints at this time.

## 2022-05-13 NOTE — ED Notes (Signed)
Attempted to call [facility] to give report. Secretary unable to find RN or Chg. Will return call in 10 min

## 2022-05-13 NOTE — ED Notes (Signed)
Pt will be taking his clothes, glasses and cell phone

## 2022-05-13 NOTE — Telephone Encounter (Signed)
Pt c/o Syncope: STAT if syncope occurred within 30 minutes and pt complains of lightheadedness High Priority if episode of passing out, completely, today or in last 24 hours   Did you pass out today? Yes   When is the last time you passed out? 45 mins ago.   Has this occurred multiple times? No   Did you have any symptoms prior to passing out?   Dizzy, migraine and lightheadedness.   Pt had procedure done 10/31 with Dr. Ali Lowe and believes that it may be due to this and the new valve.

## 2022-05-13 NOTE — Telephone Encounter (Signed)
I personally spoke to the patient with plans to monitor symptoms for now and if recurrence, then he will contact our team for further assistance. He has had no further symptoms since the episode this morning. He has follow up with myself 11/6 at the [facility] office. Patient is aware.   Kathyrn Drown NP-C Structural Heart Team  Pager: [phone] Phone: [phone]

## 2022-05-13 NOTE — Telephone Encounter (Signed)
Patient's wife calling back. She states the patient had another episode and is very nauseas now. She says it happened the same as it did this morning.

## 2022-05-13 NOTE — ED Provider Notes (Signed)
[facility] DEPARTMENT Provider Note   CSN: [id number] Arrival date & time: 05/13/22  1719     History  Chief Complaint  Patient presents with   Dizziness   Nausea    LEANTHONY RHETT III is a 76 y.o. male.  76 year old male with a history of chronic atrial fibrillation on Xarelto, hypertension, hyperlipidemia, and aortic stenosis status post TAVR on 10/31.  Patient states that he was in his normal state of health at the time of discharge yesterday and today in the morning had an episode of lightheadedness that lasted for approximately 1 to 2 minutes while he was walking around.  Says that he did not lose consciousness but did feel sweaty at the time.  Says that he had another episode lasting less than a minute this afternoon and decided to come into the emergency department for evaluation.  States that he did not have any chest pain or shortness of breath.  Felt nauseous but did not have any vomiting.  Denies any double vision, slurred speech, or difficulty swallowing.       Home Medications Prior to Admission medications   Medication Sig Start Date End Date Taking? Authorizing Provider  acetaminophen (TYLENOL) 500 MG tablet Take 1,000 mg by mouth daily as needed (pain).   Yes [provider]  amLODipine (NORVASC) 5 MG tablet Take 5 mg by mouth daily.  08/17/18  Yes [provider]  amoxicillin (AMOXIL) 500 MG capsule TAKE 4 CAPSULES BY MOUTH 1 HOUR BEFORE DENTAL APPOINTMENT 09/04/21  Yes Skains, Thana Farr, MD  BELSOMRA 20 MG TABS Take 20 mg by mouth at bedtime. 01/20/22  Yes [provider]  Cholecalciferol (VITAMIN D) 2000 UNITS tablet Take 2,000 Units by mouth daily.   Yes [provider]  Coenzyme Q10 (CO Q 10) 100 MG CAPS Take 100 mg by mouth daily.   Yes [provider]  ezetimibe-simvastatin (VYTORIN) 10-40 MG per tablet Take 1 tablet by mouth daily.   Yes [provider]  fish oil-omega-3 fatty acids 1000 MG  capsule Take 1 g by mouth daily.   Yes [provider]  fluticasone (FLONASE) 50 MCG/ACT nasal spray Place 1 spray into both nostrils daily as needed for allergies or rhinitis.    Yes [provider]  metoprolol succinate (TOPROL-XL) 25 MG 24 hr tablet TAKE 1/2 TABLET(12.5 MG) BY MOUTH DAILY Patient taking differently: Take 12.5 mg by mouth daily. 02/22/22  Yes Jerline Pain, MD  olmesartan (BENICAR) 20 MG tablet Take 20 mg by mouth daily.   Yes [provider]  rivaroxaban (XARELTO) 20 MG TABS tablet Take 20 mg by mouth daily.   Yes [provider]  apixaban (ELIQUIS) 5 MG TABS tablet Take 1 tablet (5 mg total) by mouth 2 (two) times daily. (Begin after Xarelto completed). Patient not taking: Reported on 05/14/2022 05/12/22   Tommie Raymond, NP  apixaban (ELIQUIS) 5 MG TABS tablet Take 1 tablet (5 mg total) by mouth 2 (two) times daily. (Begin after Xarelto completed). 05/12/22   Early Osmond, MD  metroNIDAZOLE (METROGEL) 0.75 % gel Apply 1 Application topically 2 (two) times daily as needed (rosacea). Patient not taking: Reported on 05/14/2022    [provider]      Allergies    Crestor [rosuvastatin]    Review of Systems   Review of Systems  Physical Exam Updated Vital Signs BP 103/70 (BP Location: Left Arm)   Pulse 72   Temp 97.6  F (36.4 C) (Oral)   Resp 16   Ht '5\' 6"'$  (1.676 m)   Wt 70.3 kg   SpO2 97%   BMI 25.01 kg/m  Physical Exam Vitals and nursing note reviewed.  Constitutional:      General: He is not in acute distress.    Appearance: He is well-developed.  HENT:     Head: Normocephalic and atraumatic.     Right Ear: External ear normal.     Left Ear: External ear normal.     Nose: Nose normal.  Eyes:     Extraocular Movements: Extraocular movements intact.     Conjunctiva/sclera: Conjunctivae normal.     Pupils: Pupils are equal, round, and reactive to light.  Cardiovascular:     Rate and Rhythm: Normal rate.  Rhythm irregular.     Heart sounds: Normal heart sounds. No murmur heard. Pulmonary:     Effort: Pulmonary effort is normal. No respiratory distress.     Breath sounds: Normal breath sounds.  Abdominal:     General: There is no distension.     Palpations: Abdomen is soft. There is no mass.     Tenderness: There is no abdominal tenderness. There is no guarding.  Musculoskeletal:        General: No swelling.     Cervical back: Normal range of motion and neck supple.     Right lower leg: No edema.     Left lower leg: No edema.  Skin:    General: Skin is warm and dry.     Capillary Refill: Capillary refill takes less than 2 seconds.  Neurological:     General: No focal deficit present.     Mental Status: He is alert and oriented to person, place, and time.     Comments: MENTAL STATUS: AAOx3 CRANIAL NERVES: II: Pupils equal and reactive 3 mm BL, no RAPD III, IV, VI: EOM intact, no gaze preference or deviation, no nystagmus. V: normal sensation to light touch in V1, V2, and V3 segments bilaterally VII: no facial weakness or asymmetry, no nasolabial fold flattening VIII: normal hearing to speech and finger friction IX, X: normal palatal elevation, no uvular deviation XI: 5/5 head turn and 5/5 shoulder shrug bilaterally XII: Tongue protrudes to the right which he states is normal after surgery MOTOR: 5/5 strength in R shoulder flexion, elbow flexion and extension, and grip strength. 5/5 strength in L shoulder flexion, elbow flexion and extension, and grip strength.  5/5 strength in R hip and knee flexion, knee extension, ankle plantar and dorsiflexion. 5/5 strength in L hip and knee flexion, knee extension, ankle plantar and dorsiflexion. SENSORY: Normal sensation to light touch in all extremities COORD: Normal finger to nose and heel to shin, no tremor, no dysmetria STATION: normal stance, no truncal ataxia GAIT: Normal   Psychiatric:        Mood and Affect: Mood normal.         Behavior: Behavior normal.     ED Results / Procedures / Treatments   Labs (all labs ordered are listed, but only abnormal results are displayed) Labs Reviewed  CBC - Abnormal; Notable for the following components:      Result Value   Hemoglobin 12.4 (*)    HCT 37.0 (*)    MCV 77.9 (*)    Platelets 78 (*)    All other components within normal limits  COMPREHENSIVE METABOLIC PANEL - Abnormal; Notable for the following components:   Glucose, Bld 128 (*)  Calcium 8.4 (*)    All other components within normal limits  BASIC METABOLIC PANEL - Abnormal; Notable for the following components:   Glucose, Bld 101 (*)    Calcium 8.6 (*)    All other components within normal limits  CBC - Abnormal; Notable for the following components:   Hemoglobin 11.8 (*)    HCT 34.2 (*)    MCV 76.9 (*)    Platelets 71 (*)    All other components within normal limits  BRAIN NATRIURETIC PEPTIDE - Abnormal; Notable for the following components:   B Natriuretic Peptide 323.6 (*)    All other components within normal limits  TROPONIN I (HIGH SENSITIVITY) - Abnormal; Notable for the following components:   Troponin I (High Sensitivity) 56 (*)    All other components within normal limits  TROPONIN I (HIGH SENSITIVITY) - Abnormal; Notable for the following components:   Troponin I (High Sensitivity) 53 (*)    All other components within normal limits    EKG EKG Interpretation  Date/Time:  Thursday May 13 2022 17:33:42 EDT Ventricular Rate:  66 PR Interval:    QRS Duration: 128 QT Interval:  426 QTC Calculation: 446 R Axis:   250 Text Interpretation: Atrial flutter 66 bpm LBBB Right superior axis deviation Left ventricular hypertrophy with QRS widening and repolarization abnormality ( Cornell product ) Abnormal ECG When compared with ECG of 12-May-2022 05:49, Left bundle branch block now present Confirmed by Margaretmary Eddy [phone]) on 05/13/2022 6:28:55 PM  Radiology ECHOCARDIOGRAM  COMPLETE  Result Date: 05/14/2022    ECHOCARDIOGRAM REPORT   Patient Name:   Noreene Larsson III Date of Exam: 05/14/2022 Medical Rec #:  [id number]         Height:       66.0 in Accession #:    [id number]        Weight:       155.0 lb Date of Birth:  Jan 29, 1946         BSA:          1.794 m Patient Age:    30 years          BP:           126/91 mmHg Patient Gender: M                 HR:           76 bpm. Exam Location:  Inpatient Procedure: 2D Echo, 3D Echo, Cardiac Doppler and Color Doppler Indications:    I35.8 Other nonrheumatic aortic valve disorders  History:        Patient has prior history of Echocardiogram examinations, most                 recent 05/12/2022. Abnormal ECG, Aortic Valve Disease,                 Signs/Symptoms:Dizziness/Lightheadedness and Syncope; Risk                 Factors:Hypertension and Dyslipidemia.                 Aortic Valve: 26 mm Sapien prosthetic, stented (TAVR) valve is                 present in the aortic position.  Sonographer:    Roseanna Rainbow RDCS Referring Phys: [id number] [place]  1. S/P TAVR with mild AI; mean gradient 7 mmHg; findings similar to previous study 05/12/22.  2. Left  ventricular ejection fraction, by estimation, is 60 to 65%. The left ventricle has normal function. The left ventricle has no regional wall motion abnormalities. There is mild left ventricular hypertrophy of the basal-septal segment. Left ventricular diastolic function could not be evaluated.  3. Right ventricular systolic function is normal. The right ventricular size is mildly enlarged. There is mildly elevated pulmonary artery systolic pressure.  4. Left atrial size was mildly dilated.  5. Right atrial size was moderately dilated.  6. The mitral valve is normal in structure. Mild mitral valve regurgitation. No evidence of mitral stenosis. Moderate mitral annular calcification.  7. Tricuspid valve regurgitation is moderate.  8. The aortic valve has been repaired/replaced. Aortic  valve regurgitation is mild. No aortic stenosis is present. There is a 26 mm Sapien prosthetic (TAVR) valve present in the aortic position.  9. The inferior vena cava is normal in size with greater than 50% respiratory variability, suggesting right atrial pressure of 3 mmHg. FINDINGS  Left Ventricle: Left ventricular ejection fraction, by estimation, is 60 to 65%. The left ventricle has normal function. The left ventricle has no regional wall motion abnormalities. The left ventricular internal cavity size was normal in size. There is  mild left ventricular hypertrophy of the basal-septal segment. Left ventricular diastolic function could not be evaluated due to atrial fibrillation. Left ventricular diastolic function could not be evaluated. Right Ventricle: The right ventricular size is mildly enlarged. Right ventricular systolic function is normal. There is mildly elevated pulmonary artery systolic pressure. The tricuspid regurgitant velocity is 2.82 m/s, and with an assumed right atrial pressure of 8 mmHg, the estimated right ventricular systolic pressure is 06.2 mmHg. Left Atrium: Left atrial size was mildly dilated. Right Atrium: Right atrial size was moderately dilated. Pericardium: There is no evidence of pericardial effusion. Mitral Valve: The mitral valve is normal in structure. Moderate mitral annular calcification. Mild mitral valve regurgitation. No evidence of mitral valve stenosis. Tricuspid Valve: The tricuspid valve is normal in structure. Tricuspid valve regurgitation is moderate . No evidence of tricuspid stenosis. Aortic Valve: The aortic valve has been repaired/replaced. Aortic valve regurgitation is mild. No aortic stenosis is present. Aortic valve mean gradient measures 7.0 mmHg. Aortic valve peak gradient measures 12.6 mmHg. Aortic valve area, by VTI measures 2.80 cm. There is a 26 mm Sapien prosthetic, stented (TAVR) valve present in the aortic position. Pulmonic Valve: The pulmonic valve was  normal in structure. Pulmonic valve regurgitation is trivial. No evidence of pulmonic stenosis. Aorta: The aortic root is normal in size and structure. Venous: The inferior vena cava is normal in size with greater than 50% respiratory variability, suggesting right atrial pressure of 3 mmHg. IAS/Shunts: No atrial level shunt detected by color flow Doppler. Additional Comments: S/P TAVR with mild AI; mean gradient 7 mmHg; findings similar to previous study 05/12/22.  LEFT VENTRICLE PLAX 2D LVIDd:         4.50 cm LVIDs:         3.20 cm LV PW:         1.00 cm LV IVS:        1.40 cm LVOT diam:     2.60 cm LV SV:         90 LV SV Index:   50 LVOT Area:     5.31 cm  LV Volumes (MOD) LV vol d, MOD A2C: 108.0 ml LV vol d, MOD A4C: 63.6 ml LV vol s, MOD A2C: 28.9 ml LV vol s, MOD A4C: 28.1  ml LV SV MOD A2C:     79.1 ml LV SV MOD A4C:     63.6 ml LV SV MOD BP:      59.8 ml RIGHT VENTRICLE            IVC RV S prime:     7.51 cm/s  IVC diam: 2.10 cm TAPSE (M-mode): 0.7 cm LEFT ATRIUM             Index        RIGHT ATRIUM           Index LA diam:        4.60 cm 2.56 cm/m   RA Area:     18.00 cm LA Vol (A2C):   62.7 ml 34.94 ml/m  RA Volume:   45.60 ml  25.41 ml/m LA Vol (A4C):   54.3 ml 30.26 ml/m LA Biplane Vol: 59.7 ml 33.27 ml/m  AORTIC VALVE                     PULMONIC VALVE AV Area (Vmax):    2.81 cm      PR End Diast Vel: 1.58 msec AV Area (Vmean):   2.57 cm AV Area (VTI):     2.80 cm AV Vmax:           177.75 cm/s AV Vmean:          122.750 cm/s AV VTI:            0.323 m AV Peak Grad:      12.6 mmHg AV Mean Grad:      7.0 mmHg LVOT Vmax:         94.20 cm/s LVOT Vmean:        59.400 cm/s LVOT VTI:          0.170 m LVOT/AV VTI ratio: 0.53  AORTA Ao Root diam: 3.30 cm Ao Asc diam:  3.60 cm MITRAL VALVE                TRICUSPID VALVE MV Area (PHT): 3.62 cm     TR Peak grad:   31.8 mmHg MV Decel Time: 210 msec     TR Vmax:        282.00 cm/s MV E velocity: 116.50 cm/s                             SHUNTS                              Systemic VTI:  0.17 m                             Systemic Diam: 2.60 cm Kirk Ruths MD Electronically signed by Kirk Ruths MD Signature Date/Time: 05/14/2022/9:14:21 AM    Final    DG Chest 1 View  Result Date: 05/13/2022 CLINICAL DATA:  Dizziness, status post TAVR. EXAM: CHEST  1 VIEW COMPARISON:  05/07/2022 FINDINGS: TAVR noted with expected orientation. 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 Upper normal heart size. Lungs appear clear without evidence of edema. No blunting of the costophrenic angles. IMPRESSION: 1. TAVR noted with expected orientation. 2. Upper normal heart size, without edema. 3. Left rib deformities with absence of the left fifth and  seventh ribs. Electronically Signed   By: Van Clines M.D.   On: 05/13/2022 19:57   CT Head Wo Contrast  Result Date: 05/13/2022 CLINICAL DATA:  Dizziness and nausea.  TAVR procedure today. EXAM: CT HEAD WITHOUT CONTRAST TECHNIQUE: Contiguous axial images were obtained from the base of the skull through the vertex without intravenous contrast. RADIATION DOSE REDUCTION: This exam was performed according to the departmental dose-optimization program which includes automated exposure control, adjustment of the mA and/or kV according to patient size and/or use of iterative reconstruction technique. COMPARISON:  MRI brain 01/24/2019 FINDINGS: Brain: Periventricular white matter and corona radiata hypodensities favor chronic ischemic microvascular white matter disease. Similar appearance shown on 01/24/2019. Otherwise, the brainstem, cerebellum, cerebral peduncles, thalamus, basal ganglia, basilar cisterns, and ventricular system appear within normal limits. No intracranial hemorrhage, mass lesion, or acute CVA. Vascular: Unremarkable Skull: No calvarial abnormality observed. Sinuses/Orbits: Mild chronic ethmoid sinusitis. Other:  Lucency with rim sclerosis place of the right mandibular condyle, possibly related to the patient's remote right mandibular reconstruction, correlate with operative history. IMPRESSION: 1. No acute intracranial findings. 2. Periventricular white matter and corona radiata hypodensities favor chronic ischemic microvascular white matter disease. 3. Mild chronic ethmoid sinusitis. 4. Lucency with rim sclerosis place of the right mandibular condyle, possibly related to the patient's remote right mandibular reconstruction, correlate with operative history. Electronically Signed   By: Van Clines M.D.   On: 05/13/2022 19:54    Procedures Procedures    EMERGENCY DEPARTMENT [place] CARDIAC EXAM "Study: Limited Ultrasound of the Heart and Pericardium"  INDICATIONS: presyncope with recent TAVR Multiple views of the heart and pericardium were obtained in real-time with a multi-frequency probe.  PERFORMED PF:XTKWIO IMAGES ARCHIVED?: No LIMITATIONS:  None VIEWS USED: Parasternal long axis, Parasternal short axis, and Apical 4 chamber  INTERPRETATION: Pericardial effusioin absent and Normal contractility  Medications Ordered in ED Medications  amLODipine (NORVASC) tablet 5 mg (has no administration in time range)  irbesartan (AVAPRO) tablet 150 mg (has no administration in time range)  acetaminophen (TYLENOL) tablet 650 mg (has no administration in time range)  ondansetron (ZOFRAN) injection 4 mg (has no administration in time range)  ezetimibe (ZETIA) tablet 10 mg (10 mg Oral Given 05/14/22 0150)    And  simvastatin (ZOCOR) tablet 40 mg (40 mg Oral Given 05/14/22 0150)  rivaroxaban (XARELTO) tablet 20 mg (has no administration in time range)  potassium chloride SA (KLOR-CON M) CR tablet 40 mEq (has no administration in time range)    ED Course/ Medical Decision Making/ A&P Clinical Course as of 05/14/22 1101  Thu May 13, 2022  1852 Spoke with Dr Percival Spanish from cardiology about the patients symptoms.  Recommends admission at this time. Due to limited bed availability he will reach out in the morning as well and consider dc if troponins and orthostatics are normal if pt does not have a bed at that time. He will let the interventionalists know as well.  [RP]    Clinical Course User Index [RP] Fransico Meadow, MD                           Medical Decision Making Amount and/or Complexity of Data Reviewed Labs: ordered. Radiology: ordered.  Risk Decision regarding hospitalization.   ANSLEY STANWOOD III is a 76 y.o. male with comorbidities that complicate the patient evaluation including atrial fibrillation on Xarelto, hypertension, hyperlipidemia, and aortic stenosis status post TAVR on 10/31 who presents with  presyncope  This patient presents to the ED for concern of complaints listed in HPI, this involves an extensive number of treatment options, and is a complaint that carries with it a high risk of complications and morbidity. Disposition including potential need for admission considered.   Initial Ddx:  Post TAVR complication, MI, arrhythmia, heart block, PE, stroke  MDM:  Given the patient's presyncope and recent procedure was concerned for possible postprocedural complication including heart block or arrhythmia.  Given his diaphoresis associated with it we will also work-up for MI.  No shortness of breath or chest discomfort concerning for PE.  Has a nonfocal neurologic exam and symptoms seem more consistent with presyncope rather than vertigo.  Plan:  Labs EKG Troponin Chest x-ray Bedside echo  ED Summary/Re-evaluation:  Patient was reevaluated and was stable.  Bedside echo did not reveal evidence of pericardial effusion or other acute abnormality.  EKG did reveal new left bundle branch block with atrial flutter.  Lab work showed mildly elevated troponin which was discussed with the on-call cardiologist.  Kristeen Miss that this could likely be normal postprocedure but they requested  that he be admitted for observation.  Chest x-ray without concerning findings.  Dispo: Admit to Floor   Additional history obtained from spouse Records reviewed DC Summary The following labs were independently interpreted: Serial Troponins and show  elevate without significant rise concerning for ongoing ischemia I independently reviewed the following imaging with scope of interpretation limited to determining acute life threatening conditions related to emergency care: Chest x-ray, which revealed no acute abnormality  I personally reviewed and interpreted cardiac monitoring: normal sinus rhythm  I personally reviewed and interpreted the pt's EKG: see above for interpretation  I have reviewed the patients home medications and made adjustments as needed Consults: Cardiology  Final Clinical Impression(s) / ED Diagnoses Final diagnoses:  Near syncope  History of transcatheter aortic valve replacement (TAVR)    Rx / DC Orders ED Discharge Orders     None         Fransico Meadow, MD 05/14/22 1101

## 2022-05-14 ENCOUNTER — Observation Stay (HOSPITAL_COMMUNITY): Payer: Medicare PPO

## 2022-05-14 DIAGNOSIS — I350 Nonrheumatic aortic (valve) stenosis: Secondary | ICD-10-CM

## 2022-05-14 DIAGNOSIS — R42 Dizziness and giddiness: Secondary | ICD-10-CM

## 2022-05-14 DIAGNOSIS — R55 Syncope and collapse: Secondary | ICD-10-CM | POA: Diagnosis not present

## 2022-05-14 DIAGNOSIS — I083 Combined rheumatic disorders of mitral, aortic and tricuspid valves: Secondary | ICD-10-CM | POA: Diagnosis not present

## 2022-05-14 DIAGNOSIS — Z952 Presence of prosthetic heart valve: Secondary | ICD-10-CM

## 2022-05-14 LAB — BASIC METABOLIC PANEL
Anion gap: 7 (ref 5–15)
BUN: 9 mg/dL (ref 8–23)
CO2: 22 mmol/L (ref 22–32)
Calcium: 8.6 mg/dL — ABNORMAL LOW (ref 8.9–10.3)
Chloride: 106 mmol/L (ref 98–111)
Creatinine, Ser: 0.88 mg/dL (ref 0.61–1.24)
GFR, Estimated: >60 mL/min (ref >60)
Glucose, Bld: 101 mg/dL — ABNORMAL HIGH (ref 70–99)
Potassium: 3.6 mmol/L (ref 3.5–5.1)
Sodium: 135 mmol/L (ref 135–145)

## 2022-05-14 LAB — ECHOCARDIOGRAM COMPLETE
AR max vel: 2.81 cm2
AV Area VTI: 2.8 cm2
AV Area mean vel: 2.57 cm2
AV Mean grad: 7 mmHg
AV Peak grad: 12.6 mmHg
Ao pk vel: 1.78 m/s
Area-P 1/2: 3.62 cm2
Calc EF: 65.7 %
Height: 66 in
S' Lateral: 3.2 cm
Single Plane A2C EF: 73.2 %
Single Plane A4C EF: 55.8 %
Weight: 2479.73 oz

## 2022-05-14 LAB — CBC
HCT: 34.2 % — ABNORMAL LOW (ref 39.0–52.0)
Hemoglobin: 11.8 g/dL — ABNORMAL LOW (ref 13.0–17.0)
MCH: 26.5 pg (ref 26.0–34.0)
MCHC: 34.5 g/dL (ref 30.0–36.0)
MCV: 76.9 fL — ABNORMAL LOW (ref 80.0–100.0)
Platelets: 71 10*3/uL — ABNORMAL LOW (ref 150–400)
RBC: 4.45 MIL/uL (ref 4.22–5.81)
RDW: 14.5 % (ref 11.5–15.5)
WBC: 8.4 10*3/uL (ref 4.0–10.5)
nRBC: 0 % (ref 0.0–0.2)

## 2022-05-14 LAB — BRAIN NATRIURETIC PEPTIDE: B Natriuretic Peptide: 323.6 pg/mL — ABNORMAL HIGH (ref 0.0–100.0)

## 2022-05-14 MED ORDER — ACETAMINOPHEN 325 MG PO TABS
650.0000 mg | ORAL_TABLET | ORAL | Status: DC | PRN
  Administered 2022-05-15 – 2022-05-17 (×2): 650 mg via ORAL
  Filled 2022-05-14 (×2): qty 2

## 2022-05-14 MED ORDER — AMLODIPINE BESYLATE 5 MG PO TABS
5.0000 mg | ORAL_TABLET | Freq: Every day | ORAL | Status: DC
  Administered 2022-05-14 – 2022-05-18 (×4): 5 mg via ORAL
  Filled 2022-05-14 (×5): qty 1

## 2022-05-14 MED ORDER — APIXABAN 5 MG PO TABS
5.0000 mg | ORAL_TABLET | Freq: Two times a day (BID) | ORAL | Status: DC
  Filled 2022-05-14: qty 1

## 2022-05-14 MED ORDER — ONDANSETRON HCL 4 MG/2ML IJ SOLN: 4.0000 mg | Freq: Four times a day (QID) | INTRAMUSCULAR | Status: DC | PRN

## 2022-05-14 MED ORDER — IRBESARTAN 150 MG PO TABS
150.0000 mg | ORAL_TABLET | Freq: Every day | ORAL | Status: DC
  Administered 2022-05-14 – 2022-05-18 (×4): 150 mg via ORAL
  Filled 2022-05-14 (×5): qty 1

## 2022-05-14 MED ORDER — POTASSIUM CHLORIDE CRYS ER 20 MEQ PO TBCR
40.0000 meq | EXTENDED_RELEASE_TABLET | Freq: Two times a day (BID) | ORAL | Status: AC
  Administered 2022-05-14 (×2): 40 meq via ORAL
  Filled 2022-05-14 (×2): qty 2

## 2022-05-14 MED ORDER — EZETIMIBE 10 MG PO TABS
10.0000 mg | ORAL_TABLET | Freq: Every day | ORAL | Status: DC
  Administered 2022-05-14 – 2022-05-17 (×5): 10 mg via ORAL
  Filled 2022-05-14 (×5): qty 1

## 2022-05-14 MED ORDER — POTASSIUM CHLORIDE CRYS ER 20 MEQ PO TBCR: 40.0000 meq | EXTENDED_RELEASE_TABLET | Freq: Two times a day (BID) | ORAL | Status: DC

## 2022-05-14 MED ORDER — EZETIMIBE-SIMVASTATIN 10-40 MG PO TABS: 1.0000 | ORAL_TABLET | Freq: Every day | ORAL | Status: DC

## 2022-05-14 MED ORDER — RIVAROXABAN 20 MG PO TABS
20.0000 mg | ORAL_TABLET | Freq: Every day | ORAL | Status: DC
  Administered 2022-05-14 – 2022-05-16 (×3): 20 mg via ORAL
  Filled 2022-05-14 (×3): qty 1

## 2022-05-14 MED ORDER — SIMVASTATIN 20 MG PO TABS
40.0000 mg | ORAL_TABLET | Freq: Every day | ORAL | Status: DC
  Administered 2022-05-14 – 2022-05-17 (×5): 40 mg via ORAL
  Filled 2022-05-14 (×5): qty 2

## 2022-05-14 NOTE — H&P (Signed)
Cardiology Admission History and Physical   Patient ID: Brian Klein MRN: [id number]; DOB: 1946-07-09   Admission date: 05/13/2022  PCP:  Crist Infante, MD   [facility] Cardiologist:  Candee Furbish, MD        Chief Complaint:  near syncope  Patient Profile:   Brian Klein is a 76 y.o. male with chronic afib on Xarelto, HTN, HLD, osteosarcoma of jaw s/p remote resection/chemo, mild MR and paradoxical LFLG AS now s/p TAVR 05/11/22 who is being seen 05/14/2022 for the evaluation of syncope.  History of Present Illness:   Brian Klein is a 76 yo M with chronic afib on Xarelto, HTN, HLD, osteosarcoma of jaw s/p remote resection/chemo, mild MR and paradoxical LFLG AS now s/p TAVR 05/11/2022 who presents after syncopal/pre syncopal episodes. He was discharged 05/12/2022 after TAVR in stable condition. ECG unchanged then. On the morning of 11/2, he developed near syncopal episodes along with nausea. After several episodes came in for eval.  Laboratory eval unremarkable. Hgb stable. ECG with new LBBB and permanent afib (although now rhythm has regularized). Was on metop at home but held. Orthostatic vitals in ED negative.    Past Medical History:  Diagnosis Date   Abnormal CT scan 11/26/2015   Atrial fibrillation (HCC)    Atrial fibrillation, chronic (HCC) 11/26/2015   AVD (aortic valve disease)    with bicupsid aortic valve   Chronic anticoagulation 11/26/2015   Erectile dysfunction 01/10/2013   Hypercholesterolemia 12/16/2010   Hyperlipidemia    Hypertension    MVP (mitral valve prolapse)    Osteogenic sarcoma ([place]) 1976   jaw bone   S/P TAVR (transcatheter aortic valve replacement) 05/11/2022   s/p TAVR with a 26 mm Edwards S3UR via the TF appoach by Dr. Ali Lowe & Dr. Tenny Craw   Seasonal allergies 11/19/2014    Past Surgical History:  Procedure Laterality Date   EPIGASTRIC HERNIA REPAIR N/A 05/19/2020   Procedure: OPEN EPIGASTRIC HERNIA REPAIR WITH  MESH;  Surgeon: Kinsinger, Arta Bruce, MD;  Location: [facility];  Service: General;  Laterality: N/A;   EYELID LACERATION REPAIR  2008   left and right   INTRAOPERATIVE TRANSTHORACIC ECHOCARDIOGRAM N/A 05/11/2022   Procedure: INTRAOPERATIVE TRANSTHORACIC ECHOCARDIOGRAM;  Surgeon: Early Osmond, MD;  Location: [facility];  Service: Open Heart Surgery;  Laterality: N/A;   MANDIBLE SURGERY  1976   right removal -- cancer   MANDIBLE SURGERY  1987   right reconstruction   RIGHT/LEFT HEART CATH AND CORONARY ANGIOGRAPHY N/A 03/26/2022   Procedure: RIGHT/LEFT HEART CATH AND CORONARY ANGIOGRAPHY;  Surgeon: Early Osmond, MD;  Location: [facility];  Service: Cardiovascular;  Laterality: N/A;   TRANSCATHETER AORTIC VALVE REPLACEMENT, TRANSFEMORAL N/A 05/11/2022   Procedure: Transcatheter Aortic Valve Replacement, Transfemoral;  Surgeon: Early Osmond, MD;  Location: [facility];  Service: Open Heart Surgery;  Laterality: N/A;   TRANSTHORACIC ECHOCARDIOGRAM  07/18/2002   ef 71%     Medications Prior to Admission: Prior to Admission medications   Medication Sig Start Date End Date Taking? Authorizing Provider  amLODipine (NORVASC) 5 MG tablet Take 5 mg by mouth daily.  08/17/18   [provider]  amoxicillin (AMOXIL) 500 MG capsule TAKE 4 CAPSULES BY MOUTH 1 HOUR BEFORE DENTAL APPOINTMENT 09/04/21   Jerline Pain, MD  apixaban (ELIQUIS) 5 MG TABS tablet Take 1 tablet (5 mg total) by mouth 2 (two) times daily. (Begin after Xarelto completed).  05/12/22   Tommie Raymond, NP  apixaban (ELIQUIS) 5 MG TABS tablet Take 1 tablet (5 mg total) by mouth 2 (two) times daily. (Begin after Xarelto completed). 05/12/22   Early Osmond, MD  BELSOMRA 20 MG TABS Take 20 mg by mouth at bedtime. 01/20/22   [provider]  Cholecalciferol (VITAMIN D) 2000 UNITS tablet Take 2,000 Units by mouth daily.    [provider]  Coenzyme Q10 (CO Q 10) 100 MG CAPS Take 100 mg by  mouth daily.    [provider]  ezetimibe-simvastatin (VYTORIN) 10-40 MG per tablet Take 1 tablet by mouth at bedtime.    [provider]  fish oil-omega-3 fatty acids 1000 MG capsule Take 1 g by mouth daily.    [provider]  fluticasone (FLONASE) 50 MCG/ACT nasal spray Place 1 spray into both nostrils daily as needed for allergies or rhinitis.     [provider]  metoprolol succinate (TOPROL-XL) 25 MG 24 hr tablet TAKE 1/2 TABLET(12.5 MG) BY MOUTH DAILY 02/22/22   Jerline Pain, MD  metroNIDAZOLE (METROGEL) 0.75 % gel Apply 1 Application topically 2 (two) times daily as needed (rosacea).    [provider]  olmesartan (BENICAR) 20 MG tablet Take 20 mg by mouth daily.    [provider]     Allergies:    Allergies  Allergen Reactions   Crestor [Rosuvastatin]     Legs aches and pain     Social History:   Social History   Socioeconomic History   Marital status: Married    Spouse name: [name]   Number of children: 2   Years of education: Not on file   Highest education level: Not on file  Occupational History   Occupation: retired  Tobacco Use   Smoking status: Never   Smokeless tobacco: Never  Vaping Use   Vaping Use: Never used  Substance and Sexual Activity   Alcohol use: No   Drug use: No   Sexual activity: Not on file  Other Topics Concern   Not on file  Social History Narrative   Not on file   Social Determinants of Health   Financial Resource Strain: Not on file  Food Insecurity: Not on file  Transportation Needs: Not on file  Physical Activity: Not on file  Stress: Not on file  Social Connections: Not on file  Intimate Partner Violence: Not on file    Family History:   The patient's family history includes Heart attack in his father; Hypertension in his father and sister; Stroke in his mother. There is no history of Colon cancer, Stomach cancer, Rectal cancer, or Esophageal cancer.    ROS:  Please  see the history of present illness.  All other ROS reviewed and negative.     Physical Exam/Data:   Vitals:   05/13/22 2030 05/13/22 2257 05/13/22 2329 05/14/22 0005  BP: 139/83 108/75  138/76  Pulse: 61 (!) 56  71  Resp: 19 (!) 21  17  Temp: 98 F (36.7 C) 97.6 F (36.4 C) 97.6 F (36.4 C) 97.7 F (36.5 C)  TempSrc: Oral Oral Oral Oral  SpO2: 100%   98%  Weight:    70.3 kg  Height:    '5\' 6"'$  (1.676 m)   No intake or output data in the 24 hours ending 05/14/22 0038    05/14/2022   12:05 AM 05/13/2022    5:34 PM 05/12/2022    4:00 AM  Last 3  Weights  Weight (lbs) 154 lb 15.7 oz 158 lb 11.7 oz 159 lb 6.4 oz  Weight (kg) 70.3 kg 72 kg 72.303 kg     Body mass index is 25.01 kg/m.  General:  in no acute distress HEENT: normal Neck: no JVD Vascular: No carotid bruits; Distal pulses 2+ bilaterally   Cardiac:  normal S1, S2; RRR; soft systolic murmur Lungs:  clear to auscultation bilaterally, no wheezing, rhonchi or rales  Abd: soft, nontender, no hepatomegaly  Ext: no edema Musculoskeletal:  No deformities, BUE and BLE strength normal and equal Skin: warm and dry  Neuro:  CNs 2-12 intact, no focal abnormalities noted Psych:  Normal affect    EKG:  The ECG that was done  was personally reviewed and demonstrates LBBB, underlying afib, regular rhythm  Relevant CV Studies: Echo 05/12/2022: 1. The aortic valve has been replaced by a 26 mm Sapien valve. Aortic  valve regurgitation is mild at most and varies by R-R interval, with a 2D  VCA of 0.06 cm2 at longest RR. It comprises < 10% of the circumferential  extend. Effective orifice area, by  VTI measures 3.16 cm. Aortic valve mean gradient measures 8.0 mmHg. Peak  gradient 17 mm Hg. Normal DVI   2. Left ventricular ejection fraction, by estimation, is 60 to 65%. The  left ventricle has normal function. The left ventricle has no regional  wall motion abnormalities. Left ventricular diastolic parameters are  indeterminate.    3. Right ventricular systolic function is normal. The right ventricular  size is normal. There is mildly elevated pulmonary artery systolic  pressure.   4. Left atrial size was mildly dilated.   5. Right atrial size was severely dilated.   6. The mitral valve is degenerative. Mild mitral valve regurgitation.  Moderate mitral annular calcification.   7. Tricuspid valve regurgitation is moderate.   Laboratory Data:  High Sensitivity Troponin:   Recent Labs  Lab 05/13/22 1740 05/13/22 1950  TROPONINIHS 56* 53*      Chemistry Recent Labs  Lab 05/12/22 0143 05/13/22 1740  NA 137 135  K 3.6 3.7  CL 106 105  CO2 22 25  GLUCOSE 93 128*  BUN 13 13  CREATININE 1.08 1.02  CALCIUM 8.9 8.4*  MG 1.9  --   GFRNONAA >60 >60  ANIONGAP 9 5    Recent Labs  Lab 05/07/22 0808 05/13/22 1740  PROT 7.2 7.6  ALBUMIN 4.1 4.0  AST 26 28  ALT 16 13  ALKPHOS 72 62  BILITOT 0.8 1.0   Lipids No results for input(s): "CHOL", "TRIG", "HDL", "LABVLDL", "LDLCALC", "CHOLHDL" in the last 168 hours. Hematology Recent Labs  Lab 05/12/22 0143 05/13/22 1740  WBC 11.0* 10.4  RBC 5.00 4.75  HGB 12.9* 12.4*  HCT 39.2 37.0*  MCV 78.4* 77.9*  MCH 25.8* 26.1  MCHC 32.9 33.5  RDW 14.5 14.6  PLT 100* 78*   Thyroid No results for input(s): "TSH", "FREET4" in the last 168 hours. BNPNo results for input(s): "BNP", "PROBNP" in the last 168 hours.  DDimer No results for input(s): "DDIMER" in the last 168 hours.   Radiology/Studies:  DG Chest 1 View  Result Date: 05/13/2022 CLINICAL DATA:  Dizziness, status post TAVR. EXAM: CHEST  1 VIEW COMPARISON:  05/07/2022 FINDINGS: TAVR noted with expected orientation. 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 Upper normal heart size. Lungs appear clear without evidence  of edema. No blunting of the costophrenic angles. IMPRESSION: 1. TAVR noted with expected  orientation. 2. Upper normal heart size, without edema. 3. 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 Electronically Signed   By: Van Clines M.D.   On: 05/13/2022 19:57   CT Head Wo Contrast  Result Date: 05/13/2022 CLINICAL DATA:  Dizziness and nausea.  TAVR procedure today. EXAM: CT HEAD WITHOUT CONTRAST TECHNIQUE: Contiguous axial images were obtained from the base of the skull through the vertex without intravenous contrast. RADIATION DOSE REDUCTION: This exam was performed according to the departmental dose-optimization program which includes automated exposure control, adjustment of the mA and/or kV according to patient size and/or use of iterative reconstruction technique. COMPARISON:  MRI brain 01/24/2019 FINDINGS: Brain: Periventricular white matter and corona radiata hypodensities favor chronic ischemic microvascular white matter disease. Similar appearance shown on 01/24/2019. Otherwise, the brainstem, cerebellum, cerebral peduncles, thalamus, basal ganglia, basilar cisterns, and ventricular system appear within normal limits. No intracranial hemorrhage, mass lesion, or acute CVA. Vascular: Unremarkable Skull: No calvarial abnormality observed. Sinuses/Orbits: Mild chronic ethmoid sinusitis. Other: Lucency with rim sclerosis place of the right mandibular condyle, possibly related to the patient's remote right mandibular reconstruction, correlate with operative history. IMPRESSION: 1. No acute intracranial findings. 2. Periventricular white matter and corona radiata hypodensities favor chronic ischemic microvascular white matter disease. 3. Mild chronic ethmoid sinusitis. 4. Lucency with rim sclerosis place of the right mandibular condyle, possibly related to the patient's remote right mandibular reconstruction, correlate with operative history. Electronically Signed   By: Van Clines M.D.   On: 05/13/2022 19:54     Assessment and Plan:   Near Syncope: My  biggest concern is that this is related to a conduction issue and recent TAVR.  With new LBBB and difficult recent ECG to interpret if this is an AV nodal conduction issue as well.  We will hold metoprolol to see how his heart rhythm response.  We will repeat echo to make sure there are no other issues.  Should have EP and valve team weigh in tomorrow as well. Severe AS s/p TAVR 05/11/2022: As above repeating echo and having heart valve team evaluate. Permanent Afib - continue apixaban 5 mg, hold BB as above  HTN - continue amlodipine 5 mg, start irbesartan 150 (formulary equivalent of home olmesartan), hold BB as above  HLD - continue ezetimibe and simvastatin    Risk Assessment/Risk Scores:         CHA2DS2-VASc Score =   3  This indicates a 3.2% annual risk of stroke. The patient's score is based upon:        Severity of Illness: The appropriate patient status for this patient is OBSERVATION. Observation status is judged to be reasonable and necessary in order to provide the required intensity of service to ensure the patient's safety. The patient's presenting symptoms, physical exam findings, and initial radiographic and laboratory data in the context of their medical condition is felt to place them at decreased risk for further clinical deterioration. Furthermore, it is anticipated that the patient will be medically stable for discharge from the hospital within 2 midnights of admission.    For questions or updates, please contact [place] Please consult [email or web address] for contact info under     Signed, Doyne Keel, MD  05/14/2022 12:38 AM

## 2022-05-14 NOTE — Consult Note (Addendum)
ELECTROPHYSIOLOGY CONSULT NOTE    Patient ID: Brian Klein MRN: [id number], DOB/AGE: 78/29/1947 78 y.o.  Admit date: 05/13/2022 Date of Consult: 05/14/2022  Primary Physician: Crist Infante, MD Primary Cardiologist: Candee Furbish, MD  Electrophysiologist: Dr. Lovena Le remotely  Referring Provider: Dr. Marcelle Smiling  Patient Profile: Brian Klein is a 76 y.o. male with a history of permanent AF, HTN, HLD, osteosarcoma of jaw s/p remote resection/chemo, mild MR, paradoxical low-flow, low-gradient AS now s/pt TAVR 05/11/22 who is being seen today for the evaluation of near-syncope, nausea and found to have new LBBB in ER. EP being consulted for PM consideration at the request of Dr. Marcelle Smiling.  HPI:  Brian Klein is a 76 y.o. male with the above PMH, who was recently discharged 11/1 s/p TAVR in stable contidion. On morning of 11/2, he developed near syncope and nausea. He had eaten breakfast and was at the sink washing up. He felt dizzy, and sat down on the floor. Thought he was going to pass out, but did not fully syncopize.  Later on 11/2, after he had taken a shower, felt dizzy and lightheaded again, and walked to bed and laid down. Also did not pass out. He called the clinic and was instructed to come to ED for further eval.   Since TAVR procedure, has not had much of an appetite, and food does not taste the same as it did pre-procedure. He is still eating and drinking well though.  Potassium3.6 (11/03 0617) Magnesium  1.9 (11/01 0143) Creatinine, ser  0.88 (11/03 0617) PLT  71* (11/03 0617) HGB  11.8* (11/03 0617) WBC 8.4 (11/03 0617) Troponin I (High Sensitivity)53* (11/02 1950).    He denies chest pain, palpitations, dyspnea, PND, orthopnea, nausea, vomiting, dizziness, syncope, edema, weight gain, or early satiety.  Wife notes that both episodes happened while patient was walking and active.   Past Medical History:  Diagnosis Date   Abnormal CT scan 11/26/2015    Atrial fibrillation (HCC)    Atrial fibrillation, chronic (HCC) 11/26/2015   AVD (aortic valve disease)    with bicupsid aortic valve   Chronic anticoagulation 11/26/2015   Erectile dysfunction 01/10/2013   Hypercholesterolemia 12/16/2010   Hyperlipidemia    Hypertension    MVP (mitral valve prolapse)    Osteogenic sarcoma ([place]) 1976   jaw bone   S/P TAVR (transcatheter aortic valve replacement) 05/11/2022   s/p TAVR with a 26 mm Edwards S3UR via the TF appoach by Dr. Ali Lowe & Dr. Tenny Craw   Seasonal allergies 11/19/2014     Surgical History:  Past Surgical History:  Procedure Laterality Date   EPIGASTRIC HERNIA REPAIR N/A 05/19/2020   Procedure: OPEN EPIGASTRIC HERNIA REPAIR WITH MESH;  Surgeon: Kinsinger, Arta Bruce, MD;  Location: [facility];  Service: General;  Laterality: N/A;   EYELID LACERATION REPAIR  2008   left and right   INTRAOPERATIVE TRANSTHORACIC ECHOCARDIOGRAM N/A 05/11/2022   Procedure: INTRAOPERATIVE TRANSTHORACIC ECHOCARDIOGRAM;  Surgeon: Early Osmond, MD;  Location: [facility];  Service: Open Heart Surgery;  Laterality: N/A;   MANDIBLE SURGERY  1976   right removal -- cancer   MANDIBLE SURGERY  1987   right reconstruction   RIGHT/LEFT HEART CATH AND CORONARY ANGIOGRAPHY N/A 03/26/2022   Procedure: RIGHT/LEFT HEART CATH AND CORONARY ANGIOGRAPHY;  Surgeon: Early Osmond, MD;  Location: [facility];  Service: Cardiovascular;  Laterality: N/A;   TRANSCATHETER AORTIC VALVE REPLACEMENT, TRANSFEMORAL N/A 05/11/2022   Procedure:  Transcatheter Aortic Valve Replacement, Transfemoral;  Surgeon: Early Osmond, MD;  Location: [facility];  Service: Open Heart Surgery;  Laterality: N/A;   TRANSTHORACIC ECHOCARDIOGRAM  07/18/2002   ef 71%     Medications Prior to Admission  Medication Sig Dispense Refill Last Dose   acetaminophen (TYLENOL) 500 MG tablet Take 1,000 mg by mouth daily as needed (pain).   Past Month   amLODipine (NORVASC) 5 MG tablet  Take 5 mg by mouth daily.    05/13/2022   amoxicillin (AMOXIL) 500 MG capsule TAKE 4 CAPSULES BY MOUTH 1 HOUR BEFORE DENTAL APPOINTMENT 4 capsule 3 4 months   BELSOMRA 20 MG TABS Take 20 mg by mouth at bedtime.   05/12/2022   Cholecalciferol (VITAMIN D) 2000 UNITS tablet Take 2,000 Units by mouth daily.   05/13/2022   Coenzyme Q10 (CO Q 10) 100 MG CAPS Take 100 mg by mouth daily.   05/13/2022   ezetimibe-simvastatin (VYTORIN) 10-40 MG per tablet Take 1 tablet by mouth daily.   05/13/2022   fish oil-omega-3 fatty acids 1000 MG capsule Take 1 g by mouth daily.   05/13/2022   fluticasone (FLONASE) 50 MCG/ACT nasal spray Place 1 spray into both nostrils daily as needed for allergies or rhinitis.    Past Week   metoprolol succinate (TOPROL-XL) 25 MG 24 hr tablet TAKE 1/2 TABLET(12.5 MG) BY MOUTH DAILY (Patient taking differently: Take 12.5 mg by mouth daily.) 45 tablet 1 05/13/2022 at 1100   olmesartan (BENICAR) 20 MG tablet Take 20 mg by mouth daily.   05/13/2022   rivaroxaban (XARELTO) 20 MG TABS tablet Take 20 mg by mouth daily.   05/13/2022 at 1500   apixaban (ELIQUIS) 5 MG TABS tablet Take 1 tablet (5 mg total) by mouth 2 (two) times daily. (Begin after Xarelto completed). (Patient not taking: Reported on 05/14/2022) 120 tablet 4 Not Taking   apixaban (ELIQUIS) 5 MG TABS tablet Take 1 tablet (5 mg total) by mouth 2 (two) times daily. (Begin after Xarelto completed). 60 tablet 11    metroNIDAZOLE (METROGEL) 0.75 % gel Apply 1 Application topically 2 (two) times daily as needed (rosacea). (Patient not taking: Reported on 05/14/2022)   Not Taking    Inpatient Medications:   amLODipine  5 mg Oral Daily   ezetimibe  10 mg Oral QHS   And   simvastatin  40 mg Oral QHS   irbesartan  150 mg Oral Daily   potassium chloride  40 mEq Oral BID   rivaroxaban  20 mg Oral Q supper    Allergies:  Allergies  Allergen Reactions   Crestor [Rosuvastatin] Other (See Comments)    Legs aches and pain     Social History    Socioeconomic History   Marital status: Married    Spouse name: [name]   Number of children: 2   Years of education: Not on file   Highest education level: Not on file  Occupational History   Occupation: retired  Tobacco Use   Smoking status: Never   Smokeless tobacco: Never  Vaping Use   Vaping Use: Never used  Substance and Sexual Activity   Alcohol use: No   Drug use: No   Sexual activity: Not on file  Other Topics Concern   Not on file  Social History Narrative   Not on file   Social Determinants of Health   Financial Resource Strain: Not on file  Food Insecurity: Not on file  Transportation Needs: Not on  file  Physical Activity: Not on file  Stress: Not on file  Social Connections: Not on file  Intimate Partner Violence: Not on file     Family History  Problem Relation Age of Onset   Stroke Mother    Heart attack Father    Hypertension Father    Hypertension Sister    Colon cancer Neg Hx    Stomach cancer Neg Hx    Rectal cancer Neg Hx    Esophageal cancer Neg Hx      Review of Systems: All other systems reviewed and are otherwise negative except as noted above.  Physical Exam: Vitals:   05/13/22 2329 05/14/22 0005 05/14/22 0338 05/14/22 0842  BP:  138/76 (!) 126/91 103/70  Pulse:  71  72  Resp:  17  16  Temp: 97.6 F (36.4 C) 97.7 F (36.5 C) 97.6 F (36.4 C) 97.6 F (36.4 C)  TempSrc: Oral Oral Oral Oral  SpO2:  98%  97%  Weight:  70.3 kg    Height:  '5\' 6"'$  (1.676 m)      GEN- The patient is well appearing, alert and oriented x 3 today.   HEENT: normocephalic, atraumatic; sclera clear, conjunctiva pink; hearing intact; oropharynx clear; neck supple, R jaw has well-healed surgical scar Lungs- Clear to ausculation bilaterally, normal work of breathing.  No wheezes, rales, rhonchi Heart- irregular rate and rhythm, no murmurs, rubs or gallops GI- soft, non-tender, non-distended, bowel sounds present Extremities- no clubbing, cyanosis, or  edema; DP/PT/radial pulses 2+ bilaterally MS- no significant deformity or atrophy Skin- warm and dry, no rash or lesion Psych- euthymic mood, full affect Neuro- strength and sensation are intact  Labs:   Lab Results  Component Value Date   WBC 8.4 05/14/2022   HGB 11.8 (L) 05/14/2022   HCT 34.2 (L) 05/14/2022   MCV 76.9 (L) 05/14/2022   PLT 71 (L) 05/14/2022    Recent Labs  Lab 05/13/22 1740 05/14/22 0617  NA 135 135  K 3.7 3.6  CL 105 106  CO2 25 22  BUN 13 9  CREATININE 1.02 0.88  CALCIUM 8.4* 8.6*  PROT 7.6  --   BILITOT 1.0  --   ALKPHOS 62  --   ALT 13  --   AST 28  --   GLUCOSE 128* 101*      Radiology/Studies: ECHOCARDIOGRAM COMPLETE  Result Date: 05/14/2022    ECHOCARDIOGRAM REPORT   Patient Name:   Brian Klein Date of Exam: 05/14/2022 Medical Rec #:  [id number]         Height:       66.0 in Accession #:    [id number]        Weight:       155.0 lb Date of Birth:  1945-07-17         BSA:          1.794 m Patient Age:    3 years          BP:           126/91 mmHg Patient Gender: M                 HR:           76 bpm. Exam Location:  Inpatient Procedure: 2D Echo, 3D Echo, Cardiac Doppler and Color Doppler Indications:    I35.8 Other nonrheumatic aortic valve disorders  History:        Patient has prior history of Echocardiogram examinations,  most                 recent 05/12/2022. Abnormal ECG, Aortic Valve Disease,                 Signs/Symptoms:Dizziness/Lightheadedness and Syncope; Risk                 Factors:Hypertension and Dyslipidemia.                 Aortic Valve: 26 mm Sapien prosthetic, stented (TAVR) valve is                 present in the aortic position.  Sonographer:    Roseanna Rainbow RDCS Referring Phys: [id number] [place]  1. S/P TAVR with mild AI; mean gradient 7 mmHg; findings similar to previous study 05/12/22.  2. Left ventricular ejection fraction, by estimation, is 60 to 65%. The left ventricle has normal function. The left  ventricle has no regional wall motion abnormalities. There is mild left ventricular hypertrophy of the basal-septal segment. Left ventricular diastolic function could not be evaluated.  3. Right ventricular systolic function is normal. The right ventricular size is mildly enlarged. There is mildly elevated pulmonary artery systolic pressure.  4. Left atrial size was mildly dilated.  5. Right atrial size was moderately dilated.  6. The mitral valve is normal in structure. Mild mitral valve regurgitation. No evidence of mitral stenosis. Moderate mitral annular calcification.  7. Tricuspid valve regurgitation is moderate.  8. The aortic valve has been repaired/replaced. Aortic valve regurgitation is mild. No aortic stenosis is present. There is a 26 mm Sapien prosthetic (TAVR) valve present in the aortic position.  9. The inferior vena cava is normal in size with greater than 50% respiratory variability, suggesting right atrial pressure of 3 mmHg. FINDINGS  Left Ventricle: Left ventricular ejection fraction, by estimation, is 60 to 65%. The left ventricle has normal function. The left ventricle has no regional wall motion abnormalities. The left ventricular internal cavity size was normal in size. There is  mild left ventricular hypertrophy of the basal-septal segment. Left ventricular diastolic function could not be evaluated due to atrial fibrillation. Left ventricular diastolic function could not be evaluated. Right Ventricle: The right ventricular size is mildly enlarged. Right ventricular systolic function is normal. There is mildly elevated pulmonary artery systolic pressure. The tricuspid regurgitant velocity is 2.82 m/s, and with an assumed right atrial pressure of 8 mmHg, the estimated right ventricular systolic pressure is 72.6 mmHg. Left Atrium: Left atrial size was mildly dilated. Right Atrium: Right atrial size was moderately dilated. Pericardium: There is no evidence of pericardial effusion. Mitral Valve:  The mitral valve is normal in structure. Moderate mitral annular calcification. Mild mitral valve regurgitation. No evidence of mitral valve stenosis. Tricuspid Valve: The tricuspid valve is normal in structure. Tricuspid valve regurgitation is moderate . No evidence of tricuspid stenosis. Aortic Valve: The aortic valve has been repaired/replaced. Aortic valve regurgitation is mild. No aortic stenosis is present. Aortic valve mean gradient measures 7.0 mmHg. Aortic valve peak gradient measures 12.6 mmHg. Aortic valve area, by VTI measures 2.80 cm. There is a 26 mm Sapien prosthetic, stented (TAVR) valve present in the aortic position. Pulmonic Valve: The pulmonic valve was normal in structure. Pulmonic valve regurgitation is trivial. No evidence of pulmonic stenosis. Aorta: The aortic root is normal in size and structure. Venous: The inferior vena cava is normal in size with greater than 50% respiratory variability, suggesting right atrial  pressure of 3 mmHg. IAS/Shunts: No atrial level shunt detected by color flow Doppler. Additional Comments: S/P TAVR with mild AI; mean gradient 7 mmHg; findings similar to previous study 05/12/22.  LEFT VENTRICLE PLAX 2D LVIDd:         4.50 cm LVIDs:         3.20 cm LV PW:         1.00 cm LV IVS:        1.40 cm LVOT diam:     2.60 cm LV SV:         90 LV SV Index:   50 LVOT Area:     5.31 cm  LV Volumes (MOD) LV vol d, MOD A2C: 108.0 ml LV vol d, MOD A4C: 63.6 ml LV vol s, MOD A2C: 28.9 ml LV vol s, MOD A4C: 28.1 ml LV SV MOD A2C:     79.1 ml LV SV MOD A4C:     63.6 ml LV SV MOD BP:      59.8 ml RIGHT VENTRICLE            IVC RV S prime:     7.51 cm/s  IVC diam: 2.10 cm TAPSE (M-mode): 0.7 cm LEFT ATRIUM             Index        RIGHT ATRIUM           Index LA diam:        4.60 cm 2.56 cm/m   RA Area:     18.00 cm LA Vol (A2C):   62.7 ml 34.94 ml/m  RA Volume:   45.60 ml  25.41 ml/m LA Vol (A4C):   54.3 ml 30.26 ml/m LA Biplane Vol: 59.7 ml 33.27 ml/m  AORTIC VALVE                      PULMONIC VALVE AV Area (Vmax):    2.81 cm      PR End Diast Vel: 1.58 msec AV Area (Vmean):   2.57 cm AV Area (VTI):     2.80 cm AV Vmax:           177.75 cm/s AV Vmean:          122.750 cm/s AV VTI:            0.323 m AV Peak Grad:      12.6 mmHg AV Mean Grad:      7.0 mmHg LVOT Vmax:         94.20 cm/s LVOT Vmean:        59.400 cm/s LVOT VTI:          0.170 m LVOT/AV VTI ratio: 0.53  AORTA Ao Root diam: 3.30 cm Ao Asc diam:  3.60 cm MITRAL VALVE                TRICUSPID VALVE MV Area (PHT): 3.62 cm     TR Peak grad:   31.8 mmHg MV Decel Time: 210 msec     TR Vmax:        282.00 cm/s MV E velocity: 116.50 cm/s                             SHUNTS                             Systemic VTI:  0.17 m  Systemic Diam: 2.60 cm Kirk Ruths MD Electronically signed by Kirk Ruths MD Signature Date/Time: 05/14/2022/9:14:21 AM    Final    DG Chest 1 View  Result Date: 05/13/2022 CLINICAL DATA:  Dizziness, status post TAVR. EXAM: CHEST  1 VIEW COMPARISON:  05/07/2022 FINDINGS: TAVR noted with expected orientation. 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 Upper normal heart size. Lungs appear clear without evidence of edema. No blunting of the costophrenic angles. IMPRESSION: 1. TAVR noted with expected orientation. 2. Upper normal heart size, without edema. 3. 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 Electronically Signed   By: Van Clines M.D.   On: 05/13/2022 19:57   CT Head Wo Contrast  Result Date: 05/13/2022 CLINICAL DATA:  Dizziness and nausea.  TAVR procedure today. EXAM: CT HEAD WITHOUT CONTRAST TECHNIQUE: Contiguous axial images were obtained from the base of the skull through the vertex without intravenous contrast. RADIATION DOSE REDUCTION: This exam was performed according to the departmental dose-optimization program which  includes automated exposure control, adjustment of the mA and/or kV according to patient size and/or use of iterative reconstruction technique. COMPARISON:  MRI brain 01/24/2019 FINDINGS: Brain: Periventricular white matter and corona radiata hypodensities favor chronic ischemic microvascular white matter disease. Similar appearance shown on 01/24/2019. Otherwise, the brainstem, cerebellum, cerebral peduncles, thalamus, basal ganglia, basilar cisterns, and ventricular system appear within normal limits. No intracranial hemorrhage, mass lesion, or acute CVA. Vascular: Unremarkable Skull: No calvarial abnormality observed. Sinuses/Orbits: Mild chronic ethmoid sinusitis. Other: Lucency with rim sclerosis place of the right mandibular condyle, possibly related to the patient's remote right mandibular reconstruction, correlate with operative history. IMPRESSION: 1. No acute intracranial findings. 2. Periventricular white matter and corona radiata hypodensities favor chronic ischemic microvascular white matter disease. 3. Mild chronic ethmoid sinusitis. 4. Lucency with rim sclerosis place of the right mandibular condyle, possibly related to the patient's remote right mandibular reconstruction, correlate with operative history. Electronically Signed   By: Van Clines M.D.   On: 05/13/2022 19:54   ECHOCARDIOGRAM COMPLETE  Result Date: 05/12/2022    ECHOCARDIOGRAM REPORT   Patient Name:   Brian Klein Date of Exam: 05/12/2022 Medical Rec #:  [id number]         Height:       66.0 in Accession #:    [id number]        Weight:       159.4 lb Date of Birth:  October 24, 1945         BSA:          1.816 m Patient Age:    17 years          BP:           113/69 mmHg Patient Gender: M                 HR:           94 bpm. Exam Location:  Inpatient Procedure: 2D Echo, Color Doppler and Cardiac Doppler Indications:    Post TAVR Evaluation z95.2  History:        Patient has prior history of Echocardiogram examinations, most                  recent 05/11/2022. Arrythmias:Atrial Fibrillation; Risk                 Factors:Hypertension and Dyslipidemia. 05/11/22 30m  Edwards S3U                 TAVR Implanted.  Sonographer:    Raquel Sarna Senior RDCS Referring Phys: [id number] [place]  1. The aortic valve has been replaced by a 26 mm Sapien valve. Aortic valve regurgitation is mild at most and varies by R-R interval, with a 2D VCA of 0.06 cm2 at longest RR. It comprises < 10% of the circumferential extend. Effective orifice area, by VTI measures 3.16 cm. Aortic valve mean gradient measures 8.0 mmHg. Peak gradient 17 mm Hg. Normal DVI  2. Left ventricular ejection fraction, by estimation, is 60 to 65%. The left ventricle has normal function. The left ventricle has no regional wall motion abnormalities. Left ventricular diastolic parameters are indeterminate.  3. Right ventricular systolic function is normal. The right ventricular size is normal. There is mildly elevated pulmonary artery systolic pressure.  4. Left atrial size was mildly dilated.  5. Right atrial size was severely dilated.  6. The mitral valve is degenerative. Mild mitral valve regurgitation. Moderate mitral annular calcification.  7. Tricuspid valve regurgitation is moderate. Comparison(s): Successful TAVR Placement with, at most, mild PVL. FINDINGS  Left Ventricle: Left ventricular ejection fraction, by estimation, is 60 to 65%. The left ventricle has normal function. The left ventricle has no regional wall motion abnormalities. The left ventricular internal cavity size was normal in size. There is  no left ventricular hypertrophy. Left ventricular diastolic parameters are indeterminate. Right Ventricle: The right ventricular size is normal. No increase in right ventricular wall thickness. Right ventricular systolic function is normal. There is mildly elevated pulmonary artery systolic pressure. The tricuspid regurgitant velocity is 2.91  m/s, and with an assumed  right atrial pressure of 3 mmHg, the estimated right ventricular systolic pressure is 66.0 mmHg. Left Atrium: Left atrial size was mildly dilated. Right Atrium: Right atrial size was severely dilated. Pericardium: Trivial pericardial effusion is present. Mitral Valve: The mitral valve is degenerative in appearance. Moderate mitral annular calcification. Mild mitral valve regurgitation. Tricuspid Valve: The tricuspid valve is normal in structure. Tricuspid valve regurgitation is moderate . No evidence of tricuspid stenosis. Aortic Valve: The aortic valve has been repaired/replaced. Aortic valve regurgitation is mild. Aortic valve mean gradient measures 8.0 mmHg. Aortic valve peak gradient measures 16.8 mmHg. Aortic valve area, by VTI measures 3.16 cm. Pulmonic Valve: The pulmonic valve was normal in structure. Pulmonic valve regurgitation is mild. No evidence of pulmonic stenosis. Aorta: The aortic root and ascending aorta are structurally normal, with no evidence of dilitation. IAS/Shunts: No atrial level shunt detected by color flow Doppler.  LEFT VENTRICLE PLAX 2D LVIDd:         4.55 cm LVIDs:         2.85 cm LV PW:         0.90 cm LV IVS:        0.85 cm LVOT diam:     2.38 cm LV SV:         101 LV SV Index:   56 LVOT Area:     4.45 cm  RIGHT VENTRICLE RV S prime:     13.90 cm/s TAPSE (M-mode): 1.8 cm LEFT ATRIUM             Index        RIGHT ATRIUM           Index LA diam:        4.30 cm 2.37 cm/m   RA  Area:     27.90 cm LA Vol (A2C):   84.5 ml 46.53 ml/m  RA Volume:   94.20 ml  51.87 ml/m LA Vol (A4C):   62.1 ml 34.20 ml/m LA Biplane Vol: 71.9 ml 39.59 ml/m  AORTIC VALVE AV Area (Vmax):    2.76 cm AV Area (Vmean):   3.10 cm AV Area (VTI):     3.16 cm AV Vmax:           205.00 cm/s AV Vmean:          133.000 cm/s AV VTI:            0.320 m AV Peak Grad:      16.8 mmHg AV Mean Grad:      8.0 mmHg LVOT Vmax:         127.00 cm/s LVOT Vmean:        92.700 cm/s LVOT VTI:          0.227 m LVOT/AV VTI ratio:  0.71  AORTA Ao Root diam: 3.40 cm Ao Asc diam:  3.80 cm TRICUSPID VALVE TR Peak grad:   33.9 mmHg TR Vmax:        291.00 cm/s  SHUNTS Systemic VTI:  0.23 m Systemic Diam: 2.38 cm Rudean Haskell MD Electronically signed by Rudean Haskell MD Signature Date/Time: 05/12/2022/1:46:46 PM    Final    ECHOCARDIOGRAM LIMITED  Result Date: 05/11/2022    ECHOCARDIOGRAM LIMITED REPORT   Patient Name:   Brian Klein Date of Exam: 05/11/2022 Medical Rec #:  [id number]         Height:       66.0 in Accession #:    [id number]        Weight:       160.0 lb Date of Birth:  Oct 15, 1945         BSA:          1.819 m Patient Age:    77 years          BP:           125/84 mmHg Patient Gender: M                 HR:           85 bpm. Exam Location:  Inpatient Procedure: Cardiac Doppler, Limited Echo and Color Doppler Indications:     I35.0 Nonrheumatic aortic (valve) stenosis  History:         Patient has prior history of Echocardiogram examinations, most                  recent 02/10/2022. Abnormal ECG, Aortic Valve Disease,                  Arrythmias:Atrial Fibrillation; Risk Factors:Dyslipidemia.                  Aortic stenosis.                  Aortic Valve: 26 mm Sapien prosthetic, stented (TAVR) valve is                  present in the aortic position. Procedure Date: 05/11/2022.  Sonographer:     Roseanna Rainbow RDCS Referring Phys:  Early Osmond Diagnosing Phys: Rudean Haskell MD IMPRESSIONS  1. Interventional TTE for TAVR Placement.  2. Prior to procedure, calcified aortic valve with severe aortic stenosis. AVA 0.95 cm2. DVI 0.27. Mean gradient 29 mm Hg, Peak gradient  50 mm Hg.  3. Post procedure a 26 mm Sapien valve present. Trivial PVL adjacent to the intraventricular septum seen in the PLAX post wire removal. Mean gradient 3 mm Hg, Peak gradient 5 mm Hg. EOA 4.15 cm2. Normal DVI.  4. Left ventricular ejection fraction, by estimation, is 55 to 60%. The left ventricle has normal function. The left ventricle  has no regional wall motion abnormalities.  5. Right ventricular systolic function is low normal.  6. Moderate mitral valve regurgitation.  7. Tricuspid valve regurgitation is moderate.  8. Trace perivalvular leak post deployment. . The aortic valve is calcified. Aortic valve regurgitation is mild. There is a 26 mm Sapien prosthetic (TAVR) valve present in the aortic position. Procedure Date: 05/11/2022. Comparison(s): Successful TAVR Placement. FINDINGS  Left Ventricle: Left ventricular ejection fraction, by estimation, is 55 to 60%. The left ventricle has normal function. The left ventricle has no regional wall motion abnormalities. Right Ventricle: Right ventricular systolic function is low normal. Pericardium: There is no evidence of pericardial effusion. Mitral Valve: Moderate mitral valve regurgitation. Tricuspid Valve: Tricuspid valve regurgitation is moderate. Aortic Valve: Trace perivalvular leak post deployment. The aortic valve is calcified. Aortic valve regurgitation is mild. Aortic valve mean gradient measures 3.0 mmHg. Aortic valve peak gradient measures 4.8 mmHg. Aortic valve area, by VTI measures 4.15 cm. There is a 26 mm Sapien prosthetic, stented (TAVR) valve present in the aortic position. Procedure Date: 05/11/2022. LEFT VENTRICLE PLAX 2D LVOT diam:     2.60 cm LV SV:         87 LV SV Index:   48 LVOT Area:     5.31 cm  LV Volumes (MOD) LV vol d, MOD A2C: 85.6 ml LV vol s, MOD A2C: 41.7 ml LV SV MOD A2C:     43.9 ml AORTIC VALVE AV Area (Vmax):    4.48 cm AV Area (Vmean):   3.90 cm AV Area (VTI):     4.15 cm AV Vmax:           110.00 cm/s AV Vmean:          77.000 cm/s AV VTI:            0.210 m AV Peak Grad:      4.8 mmHg AV Mean Grad:      3.0 mmHg LVOT Vmax:         92.80 cm/s LVOT Vmean:        56.500 cm/s LVOT VTI:          0.164 m LVOT/AV VTI ratio: 0.78  AORTA Ao Asc diam: 3.90 cm  SHUNTS Systemic VTI:  0.16 m Systemic Diam: 2.60 cm Rudean Haskell MD Electronically signed by  Rudean Haskell MD Signature Date/Time: 05/11/2022/11:54:24 AM    Final    Structural Heart Procedure  Result Date: 05/11/2022 See surgical note for result.  DG Chest 2 View  Result Date: 05/09/2022 CLINICAL DATA:  Preop EXAM: CHEST - 2 VIEW COMPARISON:  CT angiogram chest 04/08/2022 FINDINGS: The heart size and mediastinal contours are within normal limits. Both lungs are clear. Chronic left-sided rib deformities are unchanged. No acute fractures are identified. IMPRESSION: No active cardiopulmonary disease. Electronically Signed   By: Ronney Asters M.D.   On: 05/09/2022 23:48    EKG:11/2 - afib with regular QRS rate, 66 bpm LBBB, RAD (personally reviewed)  TELEMETRY: afib with rates 60-80s, frequent PACs and PVCs (personally reviewed)   Assessment/Plan: #) New LBBB  near-syncope EKG on admission shows  LBBB, previous EKGs showed incomplete LBBB with QRS at 137mec Home medications include metop XL 12.5 daily  - will hold BB Tele has not shown any hear block May need PM in future, not planned for today  MD to see and determine final plan  #) Permanent AF CHA2DS2-VASc Score = 3 (HTN, age) [facility] was dc on eliquis s/p TAVR Has been rate controlled, no rhythm control   #) AS s/p TAVR #) bicuspid AoV - mgmt per structural team    #) HTN #) HLD - mgmt per gen cards    For questions or updates, please contact [facility] Please consult [email or web address] for contact info under Cardiology/STEMI.  Signed, SMamie Levers NP  05/14/2022 11:09 AM  EP Attending  Patient seen and examined. Agree with above. The patient presents after a near syncopal spell and found to have new LBBB after TAVR. He has chronic atrial fib. He has been on tele and had no long pauses. He is on no AV nodal blocking drugs. I discussed the treatment options with the patient. I think single chamber PPM is indicated. I considered ILR but I strongly suspect that his spell was  due to a pause with LBBB and recent TAVR.  GCarleene OverlieTaylor,MD

## 2022-05-14 NOTE — Progress Notes (Signed)
Mobility Specialist Progress Note    05/14/22 1535  Mobility  Activity Ambulated with assistance in hallway  Level of Assistance Contact guard assist, steadying assist  Assistive Device Other (Comment) (HHA)  Distance Ambulated (ft) 420 ft  Activity Response Tolerated well  Mobility Referral Yes  $Mobility charge 1 Mobility   Pre-Mobility: 74 HR, 125/77 (90) BP, 97% SpO2 During Mobility: 103 HR, >/=90% SpO2 Post-Mobility: 88 HR, 131/94 (107) BP, 98% SpO2  Pt received in bed and agreeable. No complaints on walk. Returned to chair with call bell in reach.    Hildred Alamin Mobility Specialist  Secure Chat Only

## 2022-05-14 NOTE — Consult Note (Addendum)
[place] VALVE TEAM  Cardiology Consultation:   Patient ID: Brian Klein Klein MRN: [id number]; DOB: [birth year]  Admit date: 05/13/2022 Date of Consult: 05/14/2022  Primary Care Provider: Crist Infante, MD [facility] Cardiologist: Candee Furbish, MD  / Dr. Ali Lowe & Dr. Tenny Craw (TAVR)   [facility] Electrophysiologist: Dr. Lovena Le, MD    Patient Profile:   Brian Klein is a 76 y.o. 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   History of Present Illness:   Brian Klein was seen by Dr. Marlou Porch in 03/2021 and reported some shortness of breath with moderate activity. Echocardiogram 02/2022 showed EF 55% and severe LFLG AS with a mean gradient of 22 mm hg, AVA 0.67cm2, DVI 0.23, SVI 29 as well as mild AI/MR. He underwent [facility] 03/26/22 that showed mild to moderate obstructive coronary artery disease. He was referred to the structural heart team for further evaluation of his AS.   He was then seen by Dr. Ali Lowe at which time he was having issues with SOB and fatigue that was impacting his active lifestyle. Pre TAVR CT imaging showed anatomy suitable for transcatheter valve replacement.   He is now s/p successful TAVR with a 26 mm Edwards Sapien 3 Ultra Resilia  THV via the TF approach on 05/11/22. Post operative echo showed stable valve placement with mild AR, AVA by VTI at 3.16cm2, mean gradient at 54mHg, peak 128mg, and DVI at 0.71.   On TOC called yesterday the patient reported having some mild dizziness with standing however he felt this was similar to vertigo in the past and felt it may be related to that. He was instructed to call our team back if symptoms recurred or report to the ED if more worrisome. He then called back yesterday evening with recurrence and prompted to come to the ED  for further evaluation as there was concern for late presenting conduction abnormalities.   He was admitted to cardiology service. In the ED EKG showed new LBBB when compared to prior tracings. He remains in chronic atrial fibrillation. BNP elevated at 323. HST elevated but flat at 56>53, not consistent with ACS. CXR with no acute cardiopulmonary process. He underwent head CT that was Brian as well. Repeat echo today shows stable valve placement, mild AI (noted previously), mean gradient 47m15m, peak 12.55m59m AVA by VTI at 2.80cm2 with 26mm74mR valve.   On exam today, he states he has had no recurrent issues although has been in bed since arrival. Remains in AF with LBBB and intermittent PVCs. Beta blocker was held on presentation. He denies chest pain, palpitations, LE edema, orthopnea, abnormal bleeding, or frank syncope.    Past Medical History:  Diagnosis Date   Abnormal CT scan 11/26/2015   Atrial fibrillation (HCC)    Atrial fibrillation, chronic (HCC) [place]17/2017   AVD (aortic valve disease)    with bicupsid aortic valve   Chronic anticoagulation 11/26/2015   Erectile dysfunction 01/10/2013   Hypercholesterolemia 12/16/2010   Hyperlipidemia    Hypertension    MVP (mitral valve prolapse)    Osteogenic sarcoma (HCC) 1976   jaw bone   S/P TAVR (transcatheter aortic valve replacement) 05/11/2022   s/p TAVR with a 26 mm Edwards S3UR via the TF appoach by Dr. ThukkAli Lowe. EnterTenny Crawasonal allergies  11/19/2014    Past Surgical History:  Procedure Laterality Date   EPIGASTRIC HERNIA REPAIR N/A 05/19/2020   Procedure: OPEN EPIGASTRIC HERNIA REPAIR WITH MESH;  Surgeon: Kinsinger, Arta Bruce, MD;  Location: [facility];  Service: General;  Laterality: N/A;   EYELID LACERATION REPAIR  2008   left and right   INTRAOPERATIVE TRANSTHORACIC ECHOCARDIOGRAM N/A 05/11/2022   Procedure: INTRAOPERATIVE TRANSTHORACIC ECHOCARDIOGRAM;  Surgeon: Early Osmond, MD;  Location: [place] CV LAB;   Service: Open Heart Surgery;  Laterality: N/A;   MANDIBLE SURGERY  1976   right removal -- cancer   MANDIBLE SURGERY  1987   right reconstruction   RIGHT/LEFT HEART CATH AND CORONARY ANGIOGRAPHY N/A 03/26/2022   Procedure: RIGHT/LEFT HEART CATH AND CORONARY ANGIOGRAPHY;  Surgeon: Early Osmond, MD;  Location: [place] CV LAB;  Service: Cardiovascular;  Laterality: N/A;   TRANSCATHETER AORTIC VALVE REPLACEMENT, TRANSFEMORAL N/A 05/11/2022   Procedure: Transcatheter Aortic Valve Replacement, Transfemoral;  Surgeon: Early Osmond, MD;  Location: [place] CV LAB;  Service: Open Heart Surgery;  Laterality: N/A;   TRANSTHORACIC ECHOCARDIOGRAM  07/18/2002   ef 71%     Home Medications:  Prior to Admission medications   Medication Sig Start Date End Date Taking? Authorizing Provider  acetaminophen (TYLENOL) 500 MG tablet Take 1,000 mg by mouth daily as needed (pain).   Yes [provider]  amLODipine (NORVASC) 5 MG tablet Take 5 mg by mouth daily.  08/17/18  Yes [provider]  amoxicillin (AMOXIL) 500 MG capsule TAKE 4 CAPSULES BY MOUTH 1 HOUR BEFORE DENTAL APPOINTMENT 09/04/21  Yes Skains, Thana Farr, MD  BELSOMRA 20 MG TABS Take 20 mg by mouth at bedtime. 01/20/22  Yes [provider]  Cholecalciferol (VITAMIN D) 2000 UNITS tablet Take 2,000 Units by mouth daily.   Yes [provider]  Coenzyme Q10 (CO Q 10) 100 MG CAPS Take 100 mg by mouth daily.   Yes [provider]  ezetimibe-simvastatin (VYTORIN) 10-40 MG per tablet Take 1 tablet by mouth daily.   Yes [provider]  fish oil-omega-3 fatty acids 1000 MG capsule Take 1 g by mouth daily.   Yes [provider]  fluticasone (FLONASE) 50 MCG/ACT nasal spray Place 1 spray into both nostrils daily as needed for allergies or rhinitis.    Yes [provider]  metoprolol succinate (TOPROL-XL) 25 MG 24 hr tablet TAKE 1/2 TABLET(12.5 MG) BY MOUTH DAILY Patient taking  differently: Take 12.5 mg by mouth daily. 02/22/22  Yes Jerline Pain, MD  olmesartan (BENICAR) 20 MG tablet Take 20 mg by mouth daily.   Yes [provider]  rivaroxaban (XARELTO) 20 MG TABS tablet Take 20 mg by mouth daily.   Yes [provider]  apixaban (ELIQUIS) 5 MG TABS tablet Take 1 tablet (5 mg total) by mouth 2 (two) times daily. (Begin after Xarelto completed). Patient not taking: Reported on 05/14/2022 05/12/22   Tommie Raymond, NP  apixaban (ELIQUIS) 5 MG TABS tablet Take 1 tablet (5 mg total) by mouth 2 (two) times daily. (Begin after Xarelto completed). 05/12/22   Early Osmond, MD  metroNIDAZOLE (METROGEL) 0.75 % gel Apply 1 Application topically 2 (two) times daily as needed (rosacea). Patient not taking: Reported on 05/14/2022    [provider]    Inpatient Medications: Scheduled Meds:  amLODipine  5 mg Oral Daily   ezetimibe  10 mg Oral QHS   And   simvastatin  40 mg  Oral QHS   irbesartan  150 mg Oral Daily   rivaroxaban  20 mg Oral Q supper   Continuous Infusions:  PRN Meds: acetaminophen, ondansetron (ZOFRAN) IV  Allergies:    Allergies  Allergen Reactions   Crestor [Rosuvastatin] Other (See Comments)    Legs aches and pain     Social History:   Social History   Socioeconomic History   Marital status: Married    Spouse name: [name]   Number of children: 2   Years of education: Not on file   Highest education level: Not on file  Occupational History   Occupation: retired  Tobacco Use   Smoking status: Never   Smokeless tobacco: Never  Vaping Use   Vaping Use: Never used  Substance and Sexual Activity   Alcohol use: No   Drug use: No   Sexual activity: Not on file  Other Topics Concern   Not on file  Social History Narrative   Not on file   Social Determinants of Health   Financial Resource Strain: Not on file  Food Insecurity: Not on file  Transportation Needs: Not on file  Physical Activity: Not on file   Stress: Not on file  Social Connections: Not on file  Intimate Partner Violence: Not on file    Family History:    Family History  Problem Relation Age of Onset   Stroke Mother    Heart attack Father    Hypertension Father    Hypertension Sister    Colon cancer Neg Hx    Stomach cancer Neg Hx    Rectal cancer Neg Hx    Esophageal cancer Neg Hx     ROS:  Please see the history of present illness.   All other ROS reviewed and negative.     Physical Exam/Data:   Vitals:   05/13/22 2329 05/14/22 0005 05/14/22 0338 05/14/22 0842  BP:  138/76 (!) 126/91 103/70  Pulse:  71  72  Resp:  17  16  Temp: 97.6 F (36.4 C) 97.7 F (36.5 C) 97.6 F (36.4 C) 97.6 F (36.4 C)  TempSrc: Oral Oral Oral Oral  SpO2:  98%  97%  Weight:  70.3 kg    Height:  '5\' 6"'$  (1.676 m)      Intake/Output Summary (Last 24 hours) at 05/14/2022 1016 Last data filed at 05/14/2022 2979 Gross per 24 hour  Intake 240 ml  Output 700 ml  Net -460 ml      05/14/2022   12:05 AM 05/13/2022    5:34 PM 05/12/2022    4:00 AM  Last 3 Weights  Weight (lbs) 154 lb 15.7 oz 158 lb 11.7 oz 159 lb 6.4 oz  Weight (kg) 70.3 kg 72 kg 72.303 kg     Body mass index is 25.01 kg/m.   General: Well developed, well nourished, NAD Neck: Negative for carotid bruits. No JVD Lungs:Clear to ausculation bilaterally. No wheezes, rales, or rhonchi. Breathing is unlabored. Cardiovascular: Irregularly irregular. Soft flow murmur. Abdomen: Soft, non-tender, non-distended. No obvious abdominal masses. Extremities: No edema. No clubbing or cyanosis. DP/PT pulses 2+ bilaterally Neuro: Alert and oriented. No focal deficits. No facial asymmetry. MAE spontaneously. Psych: Responds to questions appropriately with Brian affect.    EKG:  The EKG was personally reviewed and demonstrates: 05/13/22 AF with new LBBB, PVC, HR 66bpm  Telemetry:  Telemetry was personally reviewed and demonstrates: 05/14/22 AF with rates in the  60-70's  Relevant CV Studies:  Echocardiogram 05/14/22:  1. S/P TAVR with mild AI; mean gradient 7 mmHg; findings similar to  previous study 05/12/22.   2. Left ventricular ejection fraction, by estimation, is 60 to 65%. The  left ventricle has Brian function. The left ventricle has no regional  wall motion abnormalities. There is mild left ventricular hypertrophy of  the basal-septal segment. Left  ventricular diastolic function could not be evaluated.   3. Right ventricular systolic function is Brian. The right ventricular  size is mildly enlarged. There is mildly elevated pulmonary artery  systolic pressure.   4. Left atrial size was mildly dilated.   5. Right atrial size was moderately dilated.   6. The mitral valve is Brian in structure. Mild mitral valve  regurgitation. No evidence of mitral stenosis. Moderate mitral annular  calcification.   7. Tricuspid valve regurgitation is moderate.   8. The aortic valve has been repaired/replaced. Aortic valve  regurgitation is mild. No aortic stenosis is present. There is a 26 mm  Sapien prosthetic (TAVR) valve present in the aortic position.   9. The inferior vena cava is Brian in size with greater than 50%  respiratory variability, suggesting right atrial pressure of 3 mmHg.   TAVR OPERATIVE NOTE     Date of Procedure:                05/11/2022   Preoperative Diagnosis:      Severe Aortic Stenosis    Postoperative Diagnosis:    Same    Procedure:        Transcatheter Aortic Valve Replacement - Transfemoral Approach             Edwards Sapien 3 Resilia THV (size 26 mm, model # 9755RLS, serial # [id number])              Co-Surgeons:                         Justice Rocher, MD and Lenna Sciara, MD Anesthesiologist:                  Rochele Pages, DO   Echocardiographer:              Rudean Haskell, MD   Pre-operative Echo Findings: Severe aortic stenosis Brian left ventricular systolic function    Post-operative Echo Findings: Trace paravalvular leak Brian left ventricular systolic function   Left Heart Catheterization Findings: Left ventricular end-diastolic pressure of 75mHg _____________   Echo 05/12/22: Completed but pending formal read at the time of discharge    Laboratory Data:  High Sensitivity Troponin:   Recent Labs  Lab 05/13/22 1740 05/13/22 1950  TROPONINIHS 56* 53*     Chemistry Recent Labs  Lab 05/12/22 0143 05/13/22 1740 05/14/22 0617  NA 137 135 135  K 3.6 3.7 3.6  CL 106 105 106  CO2 '22 25 22  '$ GLUCOSE 93 128* 101*  BUN '13 13 9  '$ CREATININE 1.08 1.02 0.88  CALCIUM 8.9 8.4* 8.6*  GFRNONAA >60 >60 >60  ANIONGAP '9 5 7    '$ Recent Labs  Lab 05/13/22 1740  PROT 7.6  ALBUMIN 4.0  AST 28  ALT 13  ALKPHOS 62  BILITOT 1.0   Hematology Recent Labs  Lab 05/12/22 0143 05/13/22 1740 05/14/22 0617  WBC 11.0* 10.4 8.4  RBC 5.00 4.75 4.45  HGB 12.9* 12.4* 11.8*  HCT 39.2 37.0* 34.2*  MCV 78.4* 77.9* 76.9*  MCH 25.8* 26.1 26.5  MCHC 32.9 33.5 34.5  RDW 14.5 14.6 14.5  PLT 100* 78* 71*   BNP Recent Labs  Lab 05/14/22 0617  BNP 323.6*    DDimer No results for input(s): "DDIMER" in the last 168 hours.   Radiology/Studies:  ECHOCARDIOGRAM COMPLETE  Result Date: 05/14/2022    ECHOCARDIOGRAM REPORT   Patient Name:   JHAMAL PLUCINSKI Klein Date of Exam: 05/14/2022 Medical Rec #:  [id number]         Height:       66.0 in Accession #:    [id number]        Weight:       155.0 lb Date of Birth:  [birth year]         BSA:          1.794 m Patient Age:    76 years          BP:           126/91 mmHg Patient Gender: M                 HR:           76 bpm. Exam Location:  Inpatient Procedure: 2D Echo, 3D Echo, Cardiac Doppler and Color Doppler Indications:    I35.8 Other nonrheumatic aortic valve disorders  History:        Patient has prior history of Echocardiogram examinations, most                 recent 05/12/2022. Abnormal ECG, Aortic Valve Disease,                  Signs/Symptoms:Dizziness/Lightheadedness and Syncope; Risk                 Factors:Hypertension and Dyslipidemia.                 Aortic Valve: 26 mm Sapien prosthetic, stented (TAVR) valve is                 present in the aortic position.  Sonographer:    Roseanna Rainbow RDCS Referring Phys: [id number]  1. S/P TAVR with mild AI; mean gradient 7 mmHg; findings similar to previous study 05/12/22.  2. Left ventricular ejection fraction, by estimation, is 60 to 65%. The left ventricle has Brian function. The left ventricle has no regional wall motion abnormalities. There is mild left ventricular hypertrophy of the basal-septal segment. Left ventricular diastolic function could not be evaluated.  3. Right ventricular systolic function is Brian. The right ventricular size is mildly enlarged. There is mildly elevated pulmonary artery systolic pressure.  4. Left atrial size was mildly dilated.  5. Right atrial size was moderately dilated.  6. The mitral valve is Brian in structure. Mild mitral valve regurgitation. No evidence of mitral stenosis. Moderate mitral annular calcification.  7. Tricuspid valve regurgitation is moderate.  8. The aortic valve has been repaired/replaced. Aortic valve regurgitation is mild. No aortic stenosis is present. There is a 26 mm Sapien prosthetic (TAVR) valve present in the aortic position.  9. The inferior vena cava is Brian in size with greater than 50% respiratory variability, suggesting right atrial pressure of 3 mmHg. FINDINGS  Left Ventricle: Left ventricular ejection fraction, by estimation, is 60 to 65%. The left ventricle has Brian function. The left ventricle has no regional wall motion abnormalities. The left ventricular internal cavity size was Brian in size. There is  mild left ventricular hypertrophy of the basal-septal segment. Left ventricular diastolic function could  not be evaluated due to atrial fibrillation. Left ventricular diastolic  function could not be evaluated. Right Ventricle: The right ventricular size is mildly enlarged. Right ventricular systolic function is Brian. There is mildly elevated pulmonary artery systolic pressure. The tricuspid regurgitant velocity is 2.82 m/s, and with an assumed right atrial pressure of 8 mmHg, the estimated right ventricular systolic pressure is 93.9 mmHg. Left Atrium: Left atrial size was mildly dilated. Right Atrium: Right atrial size was moderately dilated. Pericardium: There is no evidence of pericardial effusion. Mitral Valve: The mitral valve is Brian in structure. Moderate mitral annular calcification. Mild mitral valve regurgitation. No evidence of mitral valve stenosis. Tricuspid Valve: The tricuspid valve is Brian in structure. Tricuspid valve regurgitation is moderate . No evidence of tricuspid stenosis. Aortic Valve: The aortic valve has been repaired/replaced. Aortic valve regurgitation is mild. No aortic stenosis is present. Aortic valve mean gradient measures 7.0 mmHg. Aortic valve peak gradient measures 12.6 mmHg. Aortic valve area, by VTI measures 2.80 cm. There is a 26 mm Sapien prosthetic, stented (TAVR) valve present in the aortic position. Pulmonic Valve: The pulmonic valve was Brian in structure. Pulmonic valve regurgitation is trivial. No evidence of pulmonic stenosis. Aorta: The aortic root is Brian in size and structure. Venous: The inferior vena cava is Brian in size with greater than 50% respiratory variability, suggesting right atrial pressure of 3 mmHg. IAS/Shunts: No atrial level shunt detected by color flow Doppler. Additional Comments: S/P TAVR with mild AI; mean gradient 7 mmHg; findings similar to previous study 05/12/22.  LEFT VENTRICLE PLAX 2D LVIDd:         4.50 cm LVIDs:         3.20 cm LV PW:         1.00 cm LV IVS:        1.40 cm LVOT diam:     2.60 cm LV SV:         90 LV SV Index:   50 LVOT Area:     5.31 cm  LV Volumes (MOD) LV vol d, MOD A2C: 108.0 ml LV  vol d, MOD A4C: 63.6 ml LV vol s, MOD A2C: 28.9 ml LV vol s, MOD A4C: 28.1 ml LV SV MOD A2C:     79.1 ml LV SV MOD A4C:     63.6 ml LV SV MOD BP:      59.8 ml RIGHT VENTRICLE            IVC RV S prime:     7.51 cm/s  IVC diam: 2.10 cm TAPSE (M-mode): 0.7 cm LEFT ATRIUM             Index        RIGHT ATRIUM           Index LA diam:        4.60 cm 2.56 cm/m   RA Area:     18.00 cm LA Vol (A2C):   62.7 ml 34.94 ml/m  RA Volume:   45.60 ml  25.41 ml/m LA Vol (A4C):   54.3 ml 30.26 ml/m LA Biplane Vol: 59.7 ml 33.27 ml/m  AORTIC VALVE                     PULMONIC VALVE AV Area (Vmax):    2.81 cm      PR End Diast Vel: 1.58 msec AV Area (Vmean):   2.57 cm AV Area (VTI):     2.80 cm AV Vmax:  177.75 cm/s AV Vmean:          122.750 cm/s AV VTI:            0.323 m AV Peak Grad:      12.6 mmHg AV Mean Grad:      7.0 mmHg LVOT Vmax:         94.20 cm/s LVOT Vmean:        59.400 cm/s LVOT VTI:          0.170 m LVOT/AV VTI ratio: 0.53  AORTA Ao Root diam: 3.30 cm Ao Asc diam:  3.60 cm MITRAL VALVE                TRICUSPID VALVE MV Area (PHT): 3.62 cm     TR Peak grad:   31.8 mmHg MV Decel Time: 210 msec     TR Vmax:        282.00 cm/s MV E velocity: 116.50 cm/s                             SHUNTS                             Systemic VTI:  0.17 m                             Systemic Diam: 2.60 cm Kirk Ruths MD Electronically signed by Kirk Ruths MD Signature Date/Time: 05/14/2022/9:14:21 AM    Final    DG Chest 1 View  Result Date: 05/13/2022 CLINICAL DATA:  Dizziness, status post TAVR. EXAM: CHEST  1 VIEW COMPARISON:  05/07/2022 FINDINGS: TAVR noted with expected orientation. 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 Upper Brian heart size. Lungs appear clear without evidence of edema. No blunting of the costophrenic angles. IMPRESSION: 1. TAVR noted with expected orientation. 2. Upper Brian heart  size, without edema. 3. 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 Electronically Signed   By: Van Clines M.D.   On: 05/13/2022 19:57   CT Head Wo Contrast  Result Date: 05/13/2022 CLINICAL DATA:  Dizziness and nausea.  TAVR procedure today. EXAM: CT HEAD WITHOUT CONTRAST TECHNIQUE: Contiguous axial images were obtained from the base of the skull through the vertex without intravenous contrast. RADIATION DOSE REDUCTION: This exam was performed according to the departmental dose-optimization program which includes automated exposure control, adjustment of the mA and/or kV according to patient size and/or use of iterative reconstruction technique. COMPARISON:  MRI brain 01/24/2019 FINDINGS: Brain: Periventricular white matter and corona radiata hypodensities favor chronic ischemic microvascular white matter disease. Similar appearance shown on 01/24/2019. Otherwise, the brainstem, cerebellum, cerebral peduncles, thalamus, basal ganglia, basilar cisterns, and ventricular system appear within Brian limits. No intracranial hemorrhage, mass lesion, or acute CVA. Vascular: Unremarkable Skull: No calvarial abnormality observed. Sinuses/Orbits: Mild chronic ethmoid sinusitis. Other: Lucency with rim sclerosis place of the right mandibular condyle, possibly related to the patient's remote right mandibular reconstruction, correlate with operative history. IMPRESSION: 1. No acute intracranial findings. 2. Periventricular white matter and corona radiata hypodensities favor chronic ischemic microvascular white matter disease. 3. Mild chronic ethmoid sinusitis. 4. Lucency with rim sclerosis place of the right mandibular condyle, possibly related to the patient's remote right mandibular reconstruction, correlate with  operative history. Electronically Signed   By: Van Clines M.D.   On: 05/13/2022 19:54   ECHOCARDIOGRAM COMPLETE  Result Date: 05/12/2022    ECHOCARDIOGRAM REPORT    Patient Name:   [name] Date of Exam: 05/12/2022 Medical Rec #:  [id number]         Height:       66.0 in Accession #:    [id number]        Weight:       159.4 lb Date of Birth:  05-22-1946         BSA:          1.816 m Patient Age:    28 years          BP:           113/69 mmHg Patient Gender: M                 HR:           94 bpm. Exam Location:  Inpatient Procedure: 2D Echo, Color Doppler and Cardiac Doppler Indications:    Post TAVR Evaluation z95.2  History:        Patient has prior history of Echocardiogram examinations, most                 recent 05/11/2022. Arrythmias:Atrial Fibrillation; Risk                 Factors:Hypertension and Dyslipidemia. 05/11/22 4m Edwards S3U                 TAVR Implanted.  Sonographer:    ERaquel SarnaSenior RDCS Referring Phys: [id number] 1. The aortic valve has been replaced by a 26 mm Sapien valve. Aortic valve regurgitation is mild at most and varies by R-R interval, with a 2D VCA of 0.06 cm2 at longest RR. It comprises < 10% of the circumferential extend. Effective orifice area, by VTI measures 3.16 cm. Aortic valve mean gradient measures 8.0 mmHg. Peak gradient 17 mm Hg. Brian DVI  2. Left ventricular ejection fraction, by estimation, is 60 to 65%. The left ventricle has Brian function. The left ventricle has no regional wall motion abnormalities. Left ventricular diastolic parameters are indeterminate.  3. Right ventricular systolic function is Brian. The right ventricular size is Brian. There is mildly elevated pulmonary artery systolic pressure.  4. Left atrial size was mildly dilated.  5. Right atrial size was severely dilated.  6. The mitral valve is degenerative. Mild mitral valve regurgitation. Moderate mitral annular calcification.  7. Tricuspid valve regurgitation is moderate. Comparison(s): Successful TAVR Placement with, at most, mild PVL. FINDINGS  Left Ventricle: Left ventricular ejection fraction, by estimation, is 60  to 65%. The left ventricle has Brian function. The left ventricle has no regional wall motion abnormalities. The left ventricular internal cavity size was Brian in size. There is  no left ventricular hypertrophy. Left ventricular diastolic parameters are indeterminate. Right Ventricle: The right ventricular size is Brian. No increase in right ventricular wall thickness. Right ventricular systolic function is Brian. There is mildly elevated pulmonary artery systolic pressure. The tricuspid regurgitant velocity is 2.91  m/s, and with an assumed right atrial pressure of 3 mmHg, the estimated right ventricular systolic pressure is 320.9mmHg. Left Atrium: Left atrial size was mildly dilated. Right Atrium: Right atrial size was severely dilated. Pericardium: Trivial pericardial effusion is present. Mitral Valve: The mitral valve is degenerative in appearance. Moderate mitral annular calcification.  Mild mitral valve regurgitation. Tricuspid Valve: The tricuspid valve is Brian in structure. Tricuspid valve regurgitation is moderate . No evidence of tricuspid stenosis. Aortic Valve: The aortic valve has been repaired/replaced. Aortic valve regurgitation is mild. Aortic valve mean gradient measures 8.0 mmHg. Aortic valve peak gradient measures 16.8 mmHg. Aortic valve area, by VTI measures 3.16 cm. Pulmonic Valve: The pulmonic valve was Brian in structure. Pulmonic valve regurgitation is mild. No evidence of pulmonic stenosis. Aorta: The aortic root and ascending aorta are structurally Brian, with no evidence of dilitation. IAS/Shunts: No atrial level shunt detected by color flow Doppler.  LEFT VENTRICLE PLAX 2D LVIDd:         4.55 cm LVIDs:         2.85 cm LV PW:         0.90 cm LV IVS:        0.85 cm LVOT diam:     2.38 cm LV SV:         101 LV SV Index:   56 LVOT Area:     4.45 cm  RIGHT VENTRICLE RV S prime:     13.90 cm/s TAPSE (M-mode): 1.8 cm LEFT ATRIUM             Index        RIGHT ATRIUM           Index LA  diam:        4.30 cm 2.37 cm/m   RA Area:     27.90 cm LA Vol (A2C):   84.5 ml 46.53 ml/m  RA Volume:   94.20 ml  51.87 ml/m LA Vol (A4C):   62.1 ml 34.20 ml/m LA Biplane Vol: 71.9 ml 39.59 ml/m  AORTIC VALVE AV Area (Vmax):    2.76 cm AV Area (Vmean):   3.10 cm AV Area (VTI):     3.16 cm AV Vmax:           205.00 cm/s AV Vmean:          133.000 cm/s AV VTI:            0.320 m AV Peak Grad:      16.8 mmHg AV Mean Grad:      8.0 mmHg LVOT Vmax:         127.00 cm/s LVOT Vmean:        92.700 cm/s LVOT VTI:          0.227 m LVOT/AV VTI ratio: 0.71  AORTA Ao Root diam: 3.40 cm Ao Asc diam:  3.80 cm TRICUSPID VALVE TR Peak grad:   33.9 mmHg TR Vmax:        291.00 cm/s  SHUNTS Systemic VTI:  0.23 m Systemic Diam: 2.38 cm Rudean Haskell MD Electronically signed by Rudean Haskell MD Signature Date/Time: 05/12/2022/1:46:46 PM    Final    ECHOCARDIOGRAM LIMITED  Result Date: 05/11/2022    ECHOCARDIOGRAM LIMITED REPORT   Patient Name:   [name] Date of Exam: 05/11/2022 Medical Rec #:  [id number]         Height:       66.0 in Accession #:    [id number]        Weight:       160.0 lb Date of Birth:  1946-03-22         BSA:          1.819 m Patient Age:    33 years  BP:           125/84 mmHg Patient Gender: M                 HR:           85 bpm. Exam Location:  Inpatient Procedure: Cardiac Doppler, Limited Echo and Color Doppler Indications:     I35.0 Nonrheumatic aortic (valve) stenosis  History:         Patient has prior history of Echocardiogram examinations, most                  recent 02/10/2022. Abnormal ECG, Aortic Valve Disease,                  Arrythmias:Atrial Fibrillation; Risk Factors:Dyslipidemia.                  Aortic stenosis.                  Aortic Valve: 26 mm Sapien prosthetic, stented (TAVR) valve is                  present in the aortic position. Procedure Date: 05/11/2022.  Sonographer:     Roseanna Rainbow RDCS Referring Phys:  Early [place]sm[place] Diagnosing Phys: Rudean Haskell MD IMPRESSIONS  1. Interventional TTE for TAVR Placement.  2. Prior to procedure, calcified aortic valve with severe aortic stenosis. AVA 0.95 cm2. DVI 0.27. Mean gradient 29 mm Hg, Peak gradient 50 mm Hg.  3. Post procedure a 26 mm Sapien valve present. Trivial PVL adjacent to the intraventricular septum seen in the PLAX post wire removal. Mean gradient 3 mm Hg, Peak gradient 5 mm Hg. EOA 4.15 cm2. Brian DVI.  4. Left ventricular ejection fraction, by estimation, is 55 to 60%. The left ventricle has Brian function. The left ventricle has no regional wall motion abnormalities.  5. Right ventricular systolic function is low Brian.  6. Moderate mitral valve regurgitation.  7. Tricuspid valve regurgitation is moderate.  8. Trace perivalvular leak post deployment. . The aortic valve is calcified. Aortic valve regurgitation is mild. There is a 26 mm Sapien prosthetic (TAVR) valve present in the aortic position. Procedure Date: 05/11/2022. Comparison(s): Successful TAVR Placement. FINDINGS  Left Ventricle: Left ventricular ejection fraction, by estimation, is 55 to 60%. The left ventricle has Brian function. The left ventricle has no regional wall motion abnormalities. Right Ventricle: Right ventricular systolic function is low Brian. Pericardium: There is no evidence of pericardial effusion. Mitral Valve: Moderate mitral valve regurgitation. Tricuspid Valve: Tricuspid valve regurgitation is moderate. Aortic Valve: Trace perivalvular leak post deployment. The aortic valve is calcified. Aortic valve regurgitation is mild. Aortic valve mean gradient measures 3.0 mmHg. Aortic valve peak gradient measures 4.8 mmHg. Aortic valve area, by VTI measures 4.15 cm. There is a 26 mm Sapien prosthetic, stented (TAVR) valve present in the aortic position. Procedure Date: 05/11/2022. LEFT VENTRICLE PLAX 2D LVOT diam:     2.60 cm LV SV:         87 LV SV Index:   48 LVOT Area:     5.31 cm  LV Volumes (MOD) LV vol d,  MOD A2C: 85.6 ml LV vol s, MOD A2C: 41.7 ml LV SV MOD A2C:     43.9 ml AORTIC VALVE AV Area (Vmax):    4.48 cm AV Area (Vmean):   3.90 cm AV Area (VTI):     4.15 cm AV Vmax:  110.00 cm/s AV Vmean:          77.000 cm/s AV VTI:            0.210 m AV Peak Grad:      4.8 mmHg AV Mean Grad:      3.0 mmHg LVOT Vmax:         92.80 cm/s LVOT Vmean:        56.500 cm/s LVOT VTI:          0.164 m LVOT/AV VTI ratio: 0.78  AORTA Ao Asc diam: 3.90 cm  SHUNTS Systemic VTI:  0.16 m Systemic Diam: 2.60 cm Rudean Haskell MD Electronically signed by Rudean Haskell MD Signature Date/Time: 05/11/2022/11:54:24 AM    Final    Structural Heart Procedure  Result Date: 05/11/2022 See surgical note for result.   Assessment and Plan:   Near syncope with new LBBB on EKG: s/p successful TAVR with a 26 mm Edwards Sapien 3 Ultra Resilia THV via the TF approach on 05/11/22. Post operative echo with stable valve placement and mild AI. He was discharged 05/12/22 without issues and felt to be low risk for intra or post operative conduction issues with no underlying LBBB/RBBB and membraneous septal length at 74m. [facility], [facility] with new LBBB on EKG. No recurrent issues since admission. Repeat echo today shows stable valve placement, mild AI (noted previously), mean gradient 759mg, peak 12.6363m, AVA by VTI at 2.80cm2 with 69m37mUR valve. Metoprolol held. Plan EP evaluation for possible PPM placement if indicated. Ambulate patient while on telemetry to assess for recurrence.   Severe AS: s/p successful TAVR with a 26 mm Edwards Sapien 3 Ultra Resilia THV via the TF approach on 05/11/22. Post operative echo with stable valve placement and mild AI. Repeat echo today shows stable valve placement, mild AI (noted previously), mean gradient 7mmH37mpeak 12.63mmH79mVA by VTI at 2.80cm2 with 69mm S64mvalve. Continue Eliquis monotherapy. Will discuss dental SBE at follow up.     Chronic afib: Rate well controlled. Previously resumed on home Toprol XL s/p TAVR. Resumed on home Xarelto with plans to transition to Eliquis after current Xarelto RX complete. Tolerating well with no issues.   HTN: Stable today, continue current regimen.    For questions or updates, please contact [facility] consult [email or web address] for contact info under    Signed, Jill McKathyrn Drown1/09/2021 10:16 AM    ATTENDING ATTESTATION:  After conducting a review of all available clinical information with the care team, interviewing the patient, and performing a physical exam, I agree with the findings and plan described in this note.   GEN: No acute distress.   HEENT:  MMM, no JVD, no scleral icterus Cardiac: RRR, no murmurs, rubs, or gallops.  Respiratory: Clear to auscultation bilaterally. GI: Soft, nontender, non-distended  MS: No edema; No deformity. Neuro:  Nonfocal  Vasc:  +2 radial pulses  Patient is a 76 year26ld male with a history of chronic atrial fibrillation on anticoagulation, hypertension, and hyperlipidemia with remote osteosarcoma resection and chemotherapy which is now in remission who underwent TAVR with a 26 mm SAPIEN 3 valve via right transfemoral approach earlier this week.  His EKG following the procedure demonstrated a narrow QRS complex.  His membranous septum length was 9 mm.  On review of the implant it looks like the implant depth is around 4 to 5 mm.  The patient presents with syncope.  On review of his EKG  done on presentation it does demonstrate a left bundle branch block with a QRS duration of around 130 ms.  He does have underlying atrial fibrillation and is at risk for conduction disease.  Agree with plan to monitor on telemetry and very much appreciate electrophysiology recommendations.  Continue to hold beta-blocker.  Lenna Sciara, MD Pager [phone]

## 2022-05-14 NOTE — Plan of Care (Signed)

## 2022-05-14 NOTE — Plan of Care (Signed)

## 2022-05-14 NOTE — Progress Notes (Signed)
Mobility Specialist Progress Note    05/14/22 1107  Mobility  Activity Ambulated with assistance in hallway  Level of Assistance Contact guard assist, steadying assist  Assistive Device Other (Comment) (HHA)  Distance Ambulated (ft) 320 ft  Activity Response Tolerated well  $Mobility charge 1 Mobility   Pre-Mobility: 72 HR, 117/84 (95) BP, 95% SpO2 During Mobility: 100 HR, >/=88% SpO2 Post-Mobility: 74 HR, 140/89 (102) BP, 96% SpO2  Pt received in bed and agreeable. C/o being tired. Tolerated on RA. Returned to bed with call bell in reach.    Hildred Alamin Mobility Specialist  Secure Chat Only

## 2022-05-14 NOTE — Progress Notes (Signed)
  Echocardiogram 2D Echocardiogram has been performed.  Bobbye Charleston 05/14/2022, 8:29 AM

## 2022-05-15 DIAGNOSIS — I447 Left bundle-branch block, unspecified: Secondary | ICD-10-CM | POA: Diagnosis present

## 2022-05-15 DIAGNOSIS — I4821 Permanent atrial fibrillation: Secondary | ICD-10-CM | POA: Diagnosis present

## 2022-05-15 DIAGNOSIS — R42 Dizziness and giddiness: Secondary | ICD-10-CM | POA: Diagnosis not present

## 2022-05-15 DIAGNOSIS — I459 Conduction disorder, unspecified: Secondary | ICD-10-CM | POA: Diagnosis present

## 2022-05-15 DIAGNOSIS — Z8249 Family history of ischemic heart disease and other diseases of the circulatory system: Secondary | ICD-10-CM | POA: Diagnosis not present

## 2022-05-15 DIAGNOSIS — I97190 Other postprocedural cardiac functional disturbances following cardiac surgery: Secondary | ICD-10-CM | POA: Diagnosis present

## 2022-05-15 DIAGNOSIS — Z888 Allergy status to other drugs, medicaments and biological substances status: Secondary | ICD-10-CM | POA: Diagnosis not present

## 2022-05-15 DIAGNOSIS — Z006 Encounter for examination for normal comparison and control in clinical research program: Secondary | ICD-10-CM | POA: Diagnosis not present

## 2022-05-15 DIAGNOSIS — I2510 Atherosclerotic heart disease of native coronary artery without angina pectoris: Secondary | ICD-10-CM | POA: Diagnosis present

## 2022-05-15 DIAGNOSIS — Z953 Presence of xenogenic heart valve: Secondary | ICD-10-CM | POA: Diagnosis not present

## 2022-05-15 DIAGNOSIS — Y848 Other medical procedures as the cause of abnormal reaction of the patient, or of later complication, without mention of misadventure at the time of the procedure: Secondary | ICD-10-CM | POA: Diagnosis present

## 2022-05-15 DIAGNOSIS — Z823 Family history of stroke: Secondary | ICD-10-CM | POA: Diagnosis not present

## 2022-05-15 DIAGNOSIS — E7800 Pure hypercholesterolemia, unspecified: Secondary | ICD-10-CM | POA: Diagnosis present

## 2022-05-15 DIAGNOSIS — I10 Essential (primary) hypertension: Secondary | ICD-10-CM | POA: Diagnosis present

## 2022-05-15 DIAGNOSIS — Z79899 Other long term (current) drug therapy: Secondary | ICD-10-CM | POA: Diagnosis not present

## 2022-05-15 DIAGNOSIS — Z952 Presence of prosthetic heart valve: Secondary | ICD-10-CM | POA: Diagnosis not present

## 2022-05-15 DIAGNOSIS — Z9221 Personal history of antineoplastic chemotherapy: Secondary | ICD-10-CM | POA: Diagnosis not present

## 2022-05-15 DIAGNOSIS — Z85830 Personal history of malignant neoplasm of bone: Secondary | ICD-10-CM | POA: Diagnosis not present

## 2022-05-15 DIAGNOSIS — Z7901 Long term (current) use of anticoagulants: Secondary | ICD-10-CM | POA: Diagnosis not present

## 2022-05-15 DIAGNOSIS — R55 Syncope and collapse: Secondary | ICD-10-CM | POA: Diagnosis not present

## 2022-05-15 LAB — BASIC METABOLIC PANEL WITH GFR
Anion gap: 9 (ref 5–15)
BUN: 11 mg/dL (ref 8–23)
CO2: 21 mmol/L — ABNORMAL LOW (ref 22–32)
Calcium: 8.9 mg/dL (ref 8.9–10.3)
Chloride: 108 mmol/L (ref 98–111)
Creatinine, Ser: 0.91 mg/dL (ref 0.61–1.24)
GFR, Estimated: >60 mL/min
Glucose, Bld: 102 mg/dL — ABNORMAL HIGH (ref 70–99)
Potassium: 4.2 mmol/L (ref 3.5–5.1)
Sodium: 138 mmol/L (ref 135–145)

## 2022-05-15 NOTE — Progress Notes (Signed)
Rounding Note    Patient Name: Brian Klein Date of Encounter: 05/15/2022  [place] Cardiologist: Candee Furbish, MD   Subjective   Well today.  No further episodes of syncope.  Inpatient Medications    Scheduled Meds:  amLODipine  5 mg Oral Daily   ezetimibe  10 mg Oral QHS   And   simvastatin  40 mg Oral QHS   irbesartan  150 mg Oral Daily   rivaroxaban  20 mg Oral Q supper   Continuous Infusions:  PRN Meds: acetaminophen, ondansetron (ZOFRAN) IV   Vital Signs    Vitals:   05/14/22 2333 05/15/22 0143 05/15/22 0337 05/15/22 0825  BP: (!) 129/95  128/84 119/84  Pulse:   84 79  Resp:  15 (!) 22 20  Temp: 97.8 F (36.6 C)  98.1 F (36.7 C) 98.5 F (36.9 C)  TempSrc: Oral  Oral Oral  SpO2:   96% 96%  Weight:      Height:        Intake/Output Summary (Last 24 hours) at 05/15/2022 0852 Last data filed at 05/15/2022 0300 Gross per 24 hour  Intake 240 ml  Output 600 ml  Net -360 ml      05/14/2022   12:05 AM 05/13/2022    5:34 PM 05/12/2022    4:00 AM  Last 3 Weights  Weight (lbs) 154 lb 15.7 oz 158 lb 11.7 oz 159 lb 6.4 oz  Weight (kg) 70.3 kg 72 kg 72.303 kg      Telemetry    Atrial fibrillation- Personally Reviewed  ECG    Atrial fibrillation- Personally Reviewed  Physical Exam   GEN: No acute distress.   Neck: No JVD Cardiac: Irregular, no murmurs, rubs, or gallops.  Respiratory: Clear to auscultation bilaterally. GI: Soft, nontender, non-distended  MS: No edema; No deformity. Neuro:  Nonfocal  Psych: Normal affect   Labs    High Sensitivity Troponin:   Recent Labs  Lab 05/13/22 1740 05/13/22 1950  TROPONINIHS 56* 53*     Chemistry Recent Labs  Lab 05/12/22 0143 05/13/22 1740 05/14/22 0617 05/15/22 0030  NA 137 135 135 138  K 3.6 3.7 3.6 4.2  CL 106 105 106 108  CO2 '22 25 22 '$ 21*  GLUCOSE 93 128* 101* 102*  BUN '13 13 9 11  '$ CREATININE 1.08 1.02 0.88 0.91  CALCIUM 8.9 8.4* 8.6* 8.9  MG 1.9  --   --   --    PROT  --  7.6  --   --   ALBUMIN  --  4.0  --   --   AST  --  28  --   --   ALT  --  13  --   --   ALKPHOS  --  62  --   --   BILITOT  --  1.0  --   --   GFRNONAA >60 >60 >60 >60  ANIONGAP '9 5 7 9    '$ Lipids No results for input(s): "CHOL", "TRIG", "HDL", "LABVLDL", "LDLCALC", "CHOLHDL" in the last 168 hours.  Hematology Recent Labs  Lab 05/12/22 0143 05/13/22 1740 05/14/22 0617  WBC 11.0* 10.4 8.4  RBC 5.00 4.75 4.45  HGB 12.9* 12.4* 11.8*  HCT 39.2 37.0* 34.2*  MCV 78.4* 77.9* 76.9*  MCH 25.8* 26.1 26.5  MCHC 32.9 33.5 34.5  RDW 14.5 14.6 14.5  PLT 100* 78* 71*   Thyroid No results for input(s): "TSH", "FREET4" in the last 168 hours.  BNP Recent Labs  Lab 05/14/22 0617  BNP 323.6*    DDimer No results for input(s): "DDIMER" in the last 168 hours.   Radiology    ECHOCARDIOGRAM COMPLETE  Result Date: 05/14/2022    ECHOCARDIOGRAM REPORT   Patient Name:   Brian Klein Date of Exam: 05/14/2022 Medical Rec #:  [id number]         Height:       66.0 in Accession #:    [id number]        Weight:       155.0 lb Date of Birth:  1946/06/04         BSA:          1.794 m Patient Age:    76 years          BP:           126/91 mmHg Patient Gender: M                 HR:           76 bpm. Exam Location:  Inpatient Procedure: 2D Echo, 3D Echo, Cardiac Doppler and Color Doppler Indications:    I35.8 Other nonrheumatic aortic valve disorders  History:        Patient has prior history of Echocardiogram examinations, most                 recent 05/12/2022. Abnormal ECG, Aortic Valve Disease,                 Signs/Symptoms:Dizziness/Lightheadedness and Syncope; Risk                 Factors:Hypertension and Dyslipidemia.                 Aortic Valve: 26 mm Sapien prosthetic, stented (TAVR) valve is                 present in the aortic position.  Sonographer:    Roseanna Rainbow RDCS Referring Phys: [id number] [place]  1. S/P TAVR with mild AI; mean gradient 7 mmHg; findings similar to  previous study 05/12/22.  2. Left ventricular ejection fraction, by estimation, is 60 to 65%. The left ventricle has normal function. The left ventricle has no regional wall motion abnormalities. There is mild left ventricular hypertrophy of the basal-septal segment. Left ventricular diastolic function could not be evaluated.  3. Right ventricular systolic function is normal. The right ventricular size is mildly enlarged. There is mildly elevated pulmonary artery systolic pressure.  4. Left atrial size was mildly dilated.  5. Right atrial size was moderately dilated.  6. The mitral valve is normal in structure. Mild mitral valve regurgitation. No evidence of mitral stenosis. Moderate mitral annular calcification.  7. Tricuspid valve regurgitation is moderate.  8. The aortic valve has been repaired/replaced. Aortic valve regurgitation is mild. No aortic stenosis is present. There is a 26 mm Sapien prosthetic (TAVR) valve present in the aortic position.  9. The inferior vena cava is normal in size with greater than 50% respiratory variability, suggesting right atrial pressure of 3 mmHg. FINDINGS  Left Ventricle: Left ventricular ejection fraction, by estimation, is 60 to 65%. The left ventricle has normal function. The left ventricle has no regional wall motion abnormalities. The left ventricular internal cavity size was normal in size. There is  mild left ventricular hypertrophy of the basal-septal segment. Left ventricular diastolic function could not be evaluated due to atrial fibrillation. Left ventricular  diastolic function could not be evaluated. Right Ventricle: The right ventricular size is mildly enlarged. Right ventricular systolic function is normal. There is mildly elevated pulmonary artery systolic pressure. The tricuspid regurgitant velocity is 2.82 m/s, and with an assumed right atrial pressure of 8 mmHg, the estimated right ventricular systolic pressure is 38.8 mmHg. Left Atrium: Left atrial size was  mildly dilated. Right Atrium: Right atrial size was moderately dilated. Pericardium: There is no evidence of pericardial effusion. Mitral Valve: The mitral valve is normal in structure. Moderate mitral annular calcification. Mild mitral valve regurgitation. No evidence of mitral valve stenosis. Tricuspid Valve: The tricuspid valve is normal in structure. Tricuspid valve regurgitation is moderate . No evidence of tricuspid stenosis. Aortic Valve: The aortic valve has been repaired/replaced. Aortic valve regurgitation is mild. No aortic stenosis is present. Aortic valve mean gradient measures 7.0 mmHg. Aortic valve peak gradient measures 12.6 mmHg. Aortic valve area, by VTI measures 2.80 cm. There is a 26 mm Sapien prosthetic, stented (TAVR) valve present in the aortic position. Pulmonic Valve: The pulmonic valve was normal in structure. Pulmonic valve regurgitation is trivial. No evidence of pulmonic stenosis. Aorta: The aortic root is normal in size and structure. Venous: The inferior vena cava is normal in size with greater than 50% respiratory variability, suggesting right atrial pressure of 3 mmHg. IAS/Shunts: No atrial level shunt detected by color flow Doppler. Additional Comments: S/P TAVR with mild AI; mean gradient 7 mmHg; findings similar to previous study 05/12/22.  LEFT VENTRICLE PLAX 2D LVIDd:         4.50 cm LVIDs:         3.20 cm LV PW:         1.00 cm LV IVS:        1.40 cm LVOT diam:     2.60 cm LV SV:         90 LV SV Index:   50 LVOT Area:     5.31 cm  LV Volumes (MOD) LV vol d, MOD A2C: 108.0 ml LV vol d, MOD A4C: 63.6 ml LV vol s, MOD A2C: 28.9 ml LV vol s, MOD A4C: 28.1 ml LV SV MOD A2C:     79.1 ml LV SV MOD A4C:     63.6 ml LV SV MOD BP:      59.8 ml RIGHT VENTRICLE            IVC RV S prime:     7.51 cm/s  IVC diam: 2.10 cm TAPSE (M-mode): 0.7 cm LEFT ATRIUM             Index        RIGHT ATRIUM           Index LA diam:        4.60 cm 2.56 cm/m   RA Area:     18.00 cm LA Vol (A2C):   62.7  ml 34.94 ml/m  RA Volume:   45.60 ml  25.41 ml/m LA Vol (A4C):   54.3 ml 30.26 ml/m LA Biplane Vol: 59.7 ml 33.27 ml/m  AORTIC VALVE                     PULMONIC VALVE AV Area (Vmax):    2.81 cm      PR End Diast Vel: 1.58 msec AV Area (Vmean):   2.57 cm AV Area (VTI):     2.80 cm AV Vmax:  177.75 cm/s AV Vmean:          122.750 cm/s AV VTI:            0.323 m AV Peak Grad:      12.6 mmHg AV Mean Grad:      7.0 mmHg LVOT Vmax:         94.20 cm/s LVOT Vmean:        59.400 cm/s LVOT VTI:          0.170 m LVOT/AV VTI ratio: 0.53  AORTA Ao Root diam: 3.30 cm Ao Asc diam:  3.60 cm MITRAL VALVE                TRICUSPID VALVE MV Area (PHT): 3.62 cm     TR Peak grad:   31.8 mmHg MV Decel Time: 210 msec     TR Vmax:        282.00 cm/s MV E velocity: 116.50 cm/s                             SHUNTS                             Systemic VTI:  0.17 m                             Systemic Diam: 2.60 cm Kirk Ruths MD Electronically signed by Kirk Ruths MD Signature Date/Time: 05/14/2022/9:14:21 AM    Final    DG Chest 1 View  Result Date: 05/13/2022 CLINICAL DATA:  Dizziness, status post TAVR. EXAM: CHEST  1 VIEW COMPARISON:  05/07/2022 FINDINGS: TAVR noted with expected orientation. 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 Upper normal heart size. Lungs appear clear without evidence of edema. No blunting of the costophrenic angles. IMPRESSION: 1. TAVR noted with expected orientation. 2. Upper normal heart size, without edema. 3. 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 Electronically Signed   By: Van Clines M.D.   On: 05/13/2022 19:57   CT Head Wo Contrast  Result Date: 05/13/2022 CLINICAL DATA:  Dizziness and nausea.  TAVR procedure today. EXAM: CT HEAD WITHOUT CONTRAST TECHNIQUE: Contiguous axial images were obtained from the base of the skull through the vertex  without intravenous contrast. RADIATION DOSE REDUCTION: This exam was performed according to the departmental dose-optimization program which includes automated exposure control, adjustment of the mA and/or kV according to patient size and/or use of iterative reconstruction technique. COMPARISON:  MRI brain 01/24/2019 FINDINGS: Brain: Periventricular white matter and corona radiata hypodensities favor chronic ischemic microvascular white matter disease. Similar appearance shown on 01/24/2019. Otherwise, the brainstem, cerebellum, cerebral peduncles, thalamus, basal ganglia, basilar cisterns, and ventricular system appear within normal limits. No intracranial hemorrhage, mass lesion, or acute CVA. Vascular: Unremarkable Skull: No calvarial abnormality observed. Sinuses/Orbits: Mild chronic ethmoid sinusitis. Other: Lucency with rim sclerosis place of the right mandibular condyle, possibly related to the patient's remote right mandibular reconstruction, correlate with operative history. IMPRESSION: 1. No acute intracranial findings. 2. Periventricular white matter and corona radiata hypodensities favor chronic ischemic microvascular white matter disease. 3. Mild chronic ethmoid sinusitis. 4. Lucency with rim sclerosis place of the right mandibular condyle, possibly related to the patient's remote right mandibular reconstruction, correlate with  operative history. Electronically Signed   By: Van Clines M.D.   On: 05/13/2022 19:54    Cardiac Studies   TTE 05/14/22  1. S/P TAVR with mild AI; mean gradient 7 mmHg; findings similar to  previous study 05/12/22.   2. Left ventricular ejection fraction, by estimation, is 60 to 65%. The  left ventricle has normal function. The left ventricle has no regional  wall motion abnormalities. There is mild left ventricular hypertrophy of  the basal-septal segment. Left  ventricular diastolic function could not be evaluated.   3. Right ventricular systolic function is  normal. The right ventricular  size is mildly enlarged. There is mildly elevated pulmonary artery  systolic pressure.   4. Left atrial size was mildly dilated.   5. Right atrial size was moderately dilated.   6. The mitral valve is normal in structure. Mild mitral valve  regurgitation. No evidence of mitral stenosis. Moderate mitral annular  calcification.   7. Tricuspid valve regurgitation is moderate.   8. The aortic valve has been repaired/replaced. Aortic valve  regurgitation is mild. No aortic stenosis is present. There is a 26 mm  Sapien prosthetic (TAVR) valve present in the aortic position.   9. The inferior vena cava is normal in size with greater than 50%  respiratory variability, suggesting right atrial pressure of 3 mmHg.   Patient Profile     76 y.o. male with a history of severe aortic stenosis, permanent atrial fibrillation post TAVR who presented to the hospital with syncope.  Assessment & Plan    1.  New left bundle branch block/near syncope: Holding rate controlling medications.  As he has had syncope with EKG changes, likely Maeleigh Buschman need pacemaker implant.  We Jolea Dolle keep him n.p.o. at tonight after midnight in case we can paced tomorrow.  If not, we Djuna Frechette plan for Monday.  2.  Permanent atrial fibrillation: CHA2DS2-VASc of 3.  Holding anticoagulation for now due to possible pacemaker implant.  3.  Severe aortic stenosis: Status post TAVR.  4.  Hypertension: Currently well controlled  5.  Hyperlipidemia: Walfred Bettendorf be discharged on home medications.  For questions or updates, please contact [place] Please consult [email or web address] for contact info under        Signed, Ambrie Carte Meredith Leeds, MD  05/15/2022, 8:52 AM

## 2022-05-15 NOTE — Progress Notes (Addendum)
Patient reported having a presyncopial event around 12:14pm, he was only sitting upright in bed, he felt slightly nauseated, then just starts sweating, it lasted for about 45 seconds, then he is totally recovered and feels back to normal, all vitals are normal, CCMD did not capture any event, also paged Kathleen/PA to double check CCMD strips, she came to floor, but did not see any significant change either---per Dr Curt Bears, patient is to be placed NPO at [facility] 11/5 for possible pacemaker placement on 11/5 if he can get surgical team together, if not able to do 11/5, sx should be on 11/6

## 2022-05-15 NOTE — Plan of Care (Signed)

## 2022-05-15 NOTE — Progress Notes (Signed)
   Notified by RN that patient had an episode of pre-syncope that occurred around 1214. Patient was sitting in his chair when he started to feel lightheaded, sweaty. Symptoms lasted for about 45 seconds then resolved. He described the episode as "a little one" compared to his previous episode. RN contacted tele who noted no significant changes. I reviewed telemetry personally, patient remains in afib with a wide QRS complex. HR in the 70s-90s. No significant changes in telemetry noted when patient was symptomatic. BP stable at 124/86.   As patient has returned to his baseline and is no longer having symptoms, and as episode only lasted 45 seconds and there was no LOC, no need for urgent intervention. Encouraged PO intake. Dr. Curt Bears tentatively planning PPM implant tomorrow. Asked RN to notify me if patient has additional presyncopal episodes.   Margie Billet, PA-C 05/15/2022 12:42 PM

## 2022-05-16 DIAGNOSIS — R42 Dizziness and giddiness: Secondary | ICD-10-CM | POA: Diagnosis not present

## 2022-05-16 DIAGNOSIS — R55 Syncope and collapse: Secondary | ICD-10-CM | POA: Diagnosis not present

## 2022-05-16 MED ORDER — SODIUM CHLORIDE 0.9% FLUSH
3.0000 mL | Freq: Two times a day (BID) | INTRAVENOUS | Status: DC
  Administered 2022-05-16 – 2022-05-18 (×3): 3 mL via INTRAVENOUS

## 2022-05-16 MED ORDER — SODIUM CHLORIDE 0.9% FLUSH: 3.0000 mL | INTRAVENOUS | Status: DC | PRN

## 2022-05-16 NOTE — Plan of Care (Signed)

## 2022-05-16 NOTE — Progress Notes (Signed)
Rounding Note    Patient Name: Brian Klein Date of Encounter: 05/16/2022  [place] Cardiologist: Candee Furbish, MD   Subjective   Feeling well this morning.  Did have an episode of dizziness and lightheadedness, potentially similar to his episode a few days ago.  Sitting in a chair when it occurred and was not active.  Inpatient Medications    Scheduled Meds:  amLODipine  5 mg Oral Daily   ezetimibe  10 mg Oral QHS   And   simvastatin  40 mg Oral QHS   irbesartan  150 mg Oral Daily   rivaroxaban  20 mg Oral Q supper   Continuous Infusions:  PRN Meds: acetaminophen, ondansetron (ZOFRAN) IV   Vital Signs    Vitals:   05/15/22 1947 05/15/22 2303 05/16/22 0403 05/16/22 0728  BP:  134/84 120/88 112/89  Pulse: 83 77 87 84  Resp: 20 (!) 21 (!) 22 (!) 24  Temp:  97.8 F (36.6 C) (!) 97.5 F (36.4 C) 98.2 F (36.8 C)  TempSrc:  Oral Oral Oral  SpO2:  96% 96% 95%  Weight:      Height:        Intake/Output Summary (Last 24 hours) at 05/16/2022 0805 Last data filed at 05/16/2022 4332 Gross per 24 hour  Intake 480 ml  Output 1025 ml  Net -545 ml       05/14/2022   12:05 AM 05/13/2022    5:34 PM 05/12/2022    4:00 AM  Last 3 Weights  Weight (lbs) 154 lb 15.7 oz 158 lb 11.7 oz 159 lb 6.4 oz  Weight (kg) 70.3 kg 72 kg 72.303 kg      Telemetry    Atrial fibrillation-personally reviewed  ECG    None new  Physical Exam   GEN: Well nourished, well developed, in no acute distress  HEENT: normal  Neck: no JVD, carotid bruits, or masses Cardiac: irregular; no murmurs, rubs, or gallops,no edema  Respiratory:  clear to auscultation bilaterally, normal work of breathing GI: soft, nontender, nondistended, + BS MS: no deformity or atrophy  Skin: warm and dry Neuro:  Strength and sensation are intact Psych: euthymic mood, full affect   Labs    High Sensitivity Troponin:   Recent Labs  Lab 05/13/22 1740 05/13/22 1950  TROPONINIHS 56* 53*       Chemistry Recent Labs  Lab 05/12/22 0143 05/13/22 1740 05/14/22 0617 05/15/22 0030  NA 137 135 135 138  K 3.6 3.7 3.6 4.2  CL 106 105 106 108  CO2 '22 25 22 '$ 21*  GLUCOSE 93 128* 101* 102*  BUN '13 13 9 11  '$ CREATININE 1.08 1.02 0.88 0.91  CALCIUM 8.9 8.4* 8.6* 8.9  MG 1.9  --   --   --   PROT  --  7.6  --   --   ALBUMIN  --  4.0  --   --   AST  --  28  --   --   ALT  --  13  --   --   ALKPHOS  --  62  --   --   BILITOT  --  1.0  --   --   GFRNONAA >60 >60 >60 >60  ANIONGAP '9 5 7 9     '$ Lipids No results for input(s): "CHOL", "TRIG", "HDL", "LABVLDL", "LDLCALC", "CHOLHDL" in the last 168 hours.  Hematology Recent Labs  Lab 05/12/22 0143 05/13/22 1740 05/14/22 0617  WBC 11.0*  10.4 8.4  RBC 5.00 4.75 4.45  HGB 12.9* 12.4* 11.8*  HCT 39.2 37.0* 34.2*  MCV 78.4* 77.9* 76.9*  MCH 25.8* 26.1 26.5  MCHC 32.9 33.5 34.5  RDW 14.5 14.6 14.5  PLT 100* 78* 71*    Thyroid No results for input(s): "TSH", "FREET4" in the last 168 hours.  BNP Recent Labs  Lab 05/14/22 0617  BNP 323.6*     DDimer No results for input(s): "DDIMER" in the last 168 hours.   Radiology    ECHOCARDIOGRAM COMPLETE  Result Date: 05/14/2022    ECHOCARDIOGRAM REPORT   Patient Name:   DAISON BRAXTON Klein Date of Exam: 05/14/2022 Medical Rec #:  [id number]         Height:       66.0 in Accession #:    [id number]        Weight:       155.0 lb Date of Birth:  [birth year]         BSA:          1.794 m Patient Age:    25 years          BP:           126/91 mmHg Patient Gender: M                 HR:           76 bpm. Exam Location:  Inpatient Procedure: 2D Echo, 3D Echo, Cardiac Doppler and Color Doppler Indications:    I35.8 Other nonrheumatic aortic valve disorders  History:        Patient has prior history of Echocardiogram examinations, most                 recent 05/12/2022. Abnormal ECG, Aortic Valve Disease,                 Signs/Symptoms:Dizziness/Lightheadedness and Syncope; Risk                  Factors:Hypertension and Dyslipidemia.                 Aortic Valve: 26 mm Sapien prosthetic, stented (TAVR) valve is                 present in the aortic position.  Sonographer:    Roseanna Rainbow RDCS Referring Phys: [id number] [place]  1. S/P TAVR with mild AI; mean gradient 7 mmHg; findings similar to previous study 05/12/22.  2. Left ventricular ejection fraction, by estimation, is 60 to 65%. The left ventricle has normal function. The left ventricle has no regional wall motion abnormalities. There is mild left ventricular hypertrophy of the basal-septal segment. Left ventricular diastolic function could not be evaluated.  3. Right ventricular systolic function is normal. The right ventricular size is mildly enlarged. There is mildly elevated pulmonary artery systolic pressure.  4. Left atrial size was mildly dilated.  5. Right atrial size was moderately dilated.  6. The mitral valve is normal in structure. Mild mitral valve regurgitation. No evidence of mitral stenosis. Moderate mitral annular calcification.  7. Tricuspid valve regurgitation is moderate.  8. The aortic valve has been repaired/replaced. Aortic valve regurgitation is mild. No aortic stenosis is present. There is a 26 mm Sapien prosthetic (TAVR) valve present in the aortic position.  9. The inferior vena cava is normal in size with greater than 50% respiratory variability, suggesting right atrial pressure of 3 mmHg. FINDINGS  Left Ventricle: Left  ventricular ejection fraction, by estimation, is 60 to 65%. The left ventricle has normal function. The left ventricle has no regional wall motion abnormalities. The left ventricular internal cavity size was normal in size. There is  mild left ventricular hypertrophy of the basal-septal segment. Left ventricular diastolic function could not be evaluated due to atrial fibrillation. Left ventricular diastolic function could not be evaluated. Right Ventricle: The right ventricular size is  mildly enlarged. Right ventricular systolic function is normal. There is mildly elevated pulmonary artery systolic pressure. The tricuspid regurgitant velocity is 2.82 m/s, and with an assumed right atrial pressure of 8 mmHg, the estimated right ventricular systolic pressure is 36.6 mmHg. Left Atrium: Left atrial size was mildly dilated. Right Atrium: Right atrial size was moderately dilated. Pericardium: There is no evidence of pericardial effusion. Mitral Valve: The mitral valve is normal in structure. Moderate mitral annular calcification. Mild mitral valve regurgitation. No evidence of mitral valve stenosis. Tricuspid Valve: The tricuspid valve is normal in structure. Tricuspid valve regurgitation is moderate . No evidence of tricuspid stenosis. Aortic Valve: The aortic valve has been repaired/replaced. Aortic valve regurgitation is mild. No aortic stenosis is present. Aortic valve mean gradient measures 7.0 mmHg. Aortic valve peak gradient measures 12.6 mmHg. Aortic valve area, by VTI measures 2.80 cm. There is a 26 mm Sapien prosthetic, stented (TAVR) valve present in the aortic position. Pulmonic Valve: The pulmonic valve was normal in structure. Pulmonic valve regurgitation is trivial. No evidence of pulmonic stenosis. Aorta: The aortic root is normal in size and structure. Venous: The inferior vena cava is normal in size with greater than 50% respiratory variability, suggesting right atrial pressure of 3 mmHg. IAS/Shunts: No atrial level shunt detected by color flow Doppler. Additional Comments: S/P TAVR with mild AI; mean gradient 7 mmHg; findings similar to previous study 05/12/22.  LEFT VENTRICLE PLAX 2D LVIDd:         4.50 cm LVIDs:         3.20 cm LV PW:         1.00 cm LV IVS:        1.40 cm LVOT diam:     2.60 cm LV SV:         90 LV SV Index:   50 LVOT Area:     5.31 cm  LV Volumes (MOD) LV vol d, MOD A2C: 108.0 ml LV vol d, MOD A4C: 63.6 ml LV vol s, MOD A2C: 28.9 ml LV vol s, MOD A4C: 28.1 ml LV  SV MOD A2C:     79.1 ml LV SV MOD A4C:     63.6 ml LV SV MOD BP:      59.8 ml RIGHT VENTRICLE            IVC RV S prime:     7.51 cm/s  IVC diam: 2.10 cm TAPSE (M-mode): 0.7 cm LEFT ATRIUM             Index        RIGHT ATRIUM           Index LA diam:        4.60 cm 2.56 cm/m   RA Area:     18.00 cm LA Vol (A2C):   62.7 ml 34.94 ml/m  RA Volume:   45.60 ml  25.41 ml/m LA Vol (A4C):   54.3 ml 30.26 ml/m LA Biplane Vol: 59.7 ml 33.27 ml/m  AORTIC VALVE  PULMONIC VALVE AV Area (Vmax):    2.81 cm      PR End Diast Vel: 1.58 msec AV Area (Vmean):   2.57 cm AV Area (VTI):     2.80 cm AV Vmax:           177.75 cm/s AV Vmean:          122.750 cm/s AV VTI:            0.323 m AV Peak Grad:      12.6 mmHg AV Mean Grad:      7.0 mmHg LVOT Vmax:         94.20 cm/s LVOT Vmean:        59.400 cm/s LVOT VTI:          0.170 m LVOT/AV VTI ratio: 0.53  AORTA Ao Root diam: 3.30 cm Ao Asc diam:  3.60 cm MITRAL VALVE                TRICUSPID VALVE MV Area (PHT): 3.62 cm     TR Peak grad:   31.8 mmHg MV Decel Time: 210 msec     TR Vmax:        282.00 cm/s MV E velocity: 116.50 cm/s                             SHUNTS                             Systemic VTI:  0.17 m                             Systemic Diam: 2.60 cm Kirk Ruths MD Electronically signed by Kirk Ruths MD Signature Date/Time: 05/14/2022/9:14:21 AM    Final     Cardiac Studies   TTE 05/14/22  1. S/P TAVR with mild AI; mean gradient 7 mmHg; findings similar to  previous study 05/12/22.   2. Left ventricular ejection fraction, by estimation, is 60 to 65%. The  left ventricle has normal function. The left ventricle has no regional  wall motion abnormalities. There is mild left ventricular hypertrophy of  the basal-septal segment. Left  ventricular diastolic function could not be evaluated.   3. Right ventricular systolic function is normal. The right ventricular  size is mildly enlarged. There is mildly elevated pulmonary artery   systolic pressure.   4. Left atrial size was mildly dilated.   5. Right atrial size was moderately dilated.   6. The mitral valve is normal in structure. Mild mitral valve  regurgitation. No evidence of mitral stenosis. Moderate mitral annular  calcification.   7. Tricuspid valve regurgitation is moderate.   8. The aortic valve has been repaired/replaced. Aortic valve  regurgitation is mild. No aortic stenosis is present. There is a 26 mm  Sapien prosthetic (TAVR) valve present in the aortic position.   9. The inferior vena cava is normal in size with greater than 50%  respiratory variability, suggesting right atrial pressure of 3 mmHg.   Patient Profile     76 y.o. male with a history of severe aortic stenosis, permanent atrial fibrillation post TAVR who presented to the hospital with syncope.  Assessment & Plan     1.  New left branch block/near syncope: Currently holding rate controlling medications.  He did have another episode while sitting in a chair  of potentially similar symptoms.  Review of telemetry showed atrial fibrillation without any tachycardia or bradycardia.  Due to that, we Marria Mathison hold off on pacemaker implant for now.  He may require ILR at discharge.  We Yousra Ivens continue telemetry throughout the day today.  2.  Permanent atrial fibrillation: CHA2DS2-VASc of 3.  Holding anticoagulation for now.  Shaakira Borrero restart at discharge.  3.  Severe aortic stenosis: Status post TAVR  4.  Hypertension: Currently well controlled  5.  Hyperlipidemia: Tammy Wickliffe be discharged on home medications   For questions or updates, please contact [place] Please consult [email or web address] for contact info under        Signed, Aaradhya Kysar Meredith Leeds, MD  05/16/2022, 8:05 AM

## 2022-05-17 ENCOUNTER — Ambulatory Visit: Payer: Medicare PPO

## 2022-05-17 ENCOUNTER — Encounter (HOSPITAL_COMMUNITY)
Admission: EM | Disposition: A | Discharge status: Home / Self Care | Payer: Self-pay | Source: Home / Self Care | Attending: Cardiology

## 2022-05-17 DIAGNOSIS — I447 Left bundle-branch block, unspecified: Secondary | ICD-10-CM | POA: Diagnosis not present

## 2022-05-17 DIAGNOSIS — R55 Syncope and collapse: Secondary | ICD-10-CM | POA: Diagnosis not present

## 2022-05-17 HISTORY — PX: PACEMAKER IMPLANT: EP1218

## 2022-05-17 SURGERY — PACEMAKER IMPLANT

## 2022-05-17 MED ORDER — LIDOCAINE HCL (PF) 1 % IJ SOLN
INTRAMUSCULAR | Status: DC | PRN
  Administered 2022-05-17: 60 mL

## 2022-05-17 MED ORDER — CEFAZOLIN SODIUM-DEXTROSE 2-4 GM/100ML-% IV SOLN
2.0000 g | INTRAVENOUS | Status: AC
  Administered 2022-05-17: 2 g via INTRAVENOUS

## 2022-05-17 MED ORDER — SODIUM CHLORIDE 0.9 % IV SOLN: 250.0000 mL | INTRAVENOUS | Status: DC

## 2022-05-17 MED ORDER — CEFAZOLIN SODIUM-DEXTROSE 1-4 GM/50ML-% IV SOLN
1.0000 g | Freq: Four times a day (QID) | INTRAVENOUS | Status: AC
  Administered 2022-05-17 – 2022-05-18 (×3): 1 g via INTRAVENOUS
  Filled 2022-05-17 (×4): qty 50

## 2022-05-17 MED ORDER — HEPARIN (PORCINE) IN NACL 1000-0.9 UT/500ML-% IV SOLN
INTRAVENOUS | Status: AC
  Filled 2022-05-17: qty 500

## 2022-05-17 MED ORDER — HEPARIN (PORCINE) IN NACL 1000-0.9 UT/500ML-% IV SOLN
INTRAVENOUS | Status: DC | PRN
  Administered 2022-05-17: 500 mL

## 2022-05-17 MED ORDER — FENTANYL CITRATE (PF) 100 MCG/2ML IJ SOLN
INTRAMUSCULAR | Status: DC | PRN
  Administered 2022-05-17: 12.5 ug via INTRAVENOUS

## 2022-05-17 MED ORDER — ONDANSETRON HCL 4 MG/2ML IJ SOLN: 4.0000 mg | Freq: Four times a day (QID) | INTRAMUSCULAR | Status: DC | PRN

## 2022-05-17 MED ORDER — CHLORHEXIDINE GLUCONATE 4 % EX LIQD: 60.0000 mL | Freq: Once | CUTANEOUS | Status: DC

## 2022-05-17 MED ORDER — SODIUM CHLORIDE 0.9 % IV SOLN
80.0000 mg | INTRAVENOUS | Status: AC
  Administered 2022-05-17: 80 mg
  Filled 2022-05-17: qty 2

## 2022-05-17 MED ORDER — FENTANYL CITRATE (PF) 100 MCG/2ML IJ SOLN
INTRAMUSCULAR | Status: AC
  Filled 2022-05-17: qty 2

## 2022-05-17 MED ORDER — ACETAMINOPHEN 325 MG PO TABS: 325.0000 mg | ORAL_TABLET | ORAL | Status: DC | PRN

## 2022-05-17 MED ORDER — METOPROLOL TARTRATE 5 MG/5ML IV SOLN
INTRAVENOUS | Status: AC
  Filled 2022-05-17: qty 5

## 2022-05-17 MED ORDER — SODIUM CHLORIDE 0.9 % IV SOLN
INTRAVENOUS | Status: AC
  Filled 2022-05-17: qty 2

## 2022-05-17 MED ORDER — ORAL CARE MOUTH RINSE: 15.0000 mL | OROMUCOSAL | Status: DC | PRN

## 2022-05-17 MED ORDER — CEFAZOLIN SODIUM-DEXTROSE 2-4 GM/100ML-% IV SOLN
INTRAVENOUS | Status: AC
  Filled 2022-05-17: qty 100

## 2022-05-17 MED ORDER — SODIUM CHLORIDE 0.9% FLUSH
3.0000 mL | Freq: Two times a day (BID) | INTRAVENOUS | Status: DC
  Administered 2022-05-17: 3 mL via INTRAVENOUS

## 2022-05-17 MED ORDER — MIDAZOLAM HCL 5 MG/5ML IJ SOLN
INTRAMUSCULAR | Status: DC | PRN
  Administered 2022-05-17: 1 mg via INTRAVENOUS

## 2022-05-17 MED ORDER — LIDOCAINE HCL (PF) 1 % IJ SOLN
INTRAMUSCULAR | Status: AC
  Filled 2022-05-17: qty 60

## 2022-05-17 MED ORDER — METOPROLOL SUCCINATE ER 25 MG PO TB24
25.0000 mg | ORAL_TABLET | Freq: Every day | ORAL | Status: DC
  Administered 2022-05-17 – 2022-05-18 (×2): 25 mg via ORAL
  Filled 2022-05-17 (×2): qty 1

## 2022-05-17 MED ORDER — METOPROLOL TARTRATE 5 MG/5ML IV SOLN
INTRAVENOUS | Status: DC | PRN
  Administered 2022-05-17: 5 mg via INTRAVENOUS

## 2022-05-17 MED ORDER — SODIUM CHLORIDE 0.9 % IV SOLN: INTRAVENOUS | Status: DC

## 2022-05-17 MED ORDER — SODIUM CHLORIDE 0.9% FLUSH: 3.0000 mL | INTRAVENOUS | Status: DC | PRN

## 2022-05-17 MED ORDER — MIDAZOLAM HCL 5 MG/5ML IJ SOLN
INTRAMUSCULAR | Status: AC
  Filled 2022-05-17: qty 5

## 2022-05-17 SURGICAL SUPPLY — 10 items
CABLE SURGICAL S-101-97-12 (CABLE) ×2 IMPLANT
CATH SELECT PACE 669183 (CATHETERS) IMPLANT
CUTTER LV DELIVERY CATHETER 7 (MISCELLANEOUS) IMPLANT
KIT MICROPUNCTURE NIT STIFF (SHEATH) IMPLANT
LEAD INGEVITY 7842 59 (Lead) IMPLANT
PACEMAKER ACCOLADE SR (Pacemaker) IMPLANT
PAD DEFIB RADIO PHYSIO CONN (PAD) ×2 IMPLANT
SHEATH 8FR PRELUDE SNAP 13 (SHEATH) IMPLANT
TRAY PACEMAKER INSERTION (PACKS) ×2 IMPLANT
WIRE HI TORQ VERSACORE-J 145CM (WIRE) IMPLANT

## 2022-05-17 NOTE — Discharge Instructions (Signed)
     Supplemental Discharge Instructions for  Pacemaker/Defibrillator Patients  Tomorrow, 05/18/22, send in a device transmission  Activity No heavy lifting or vigorous activity with your left/right arm for 6 to 8 weeks.  Do not raise your left/right arm above your head for one week.  Gradually raise your affected arm as drawn below.             05/22/22                     05/23/22                     05/24/22                  05/25/22 __  NO DRIVING until cleared to by your doctor  WOUND CARE Keep the wound area clean and dry.  Do not get this area wet , no showers for one week; you may shower on  05/25/22, then keep as dry as possible, pat dry   . The tape/steri-strips on your wound will fall off; do not pull them off.  No bandage is needed on the site.  DO  NOT apply any creams, oils, or ointments to the wound area. If you notice any drainage or discharge from the wound, any swelling or bruising at the site, or you develop a fever > 101? F after you are discharged home, call the office at once.  Special Instructions You are still able to use cellular telephones; use the ear opposite the side where you have your pacemaker/defibrillator.  Avoid carrying your cellular phone near your device. When traveling through airports, show security personnel your identification card to avoid being screened in the metal detectors.  Ask the security personnel to use the hand wand. Avoid arc welding equipment, MRI testing (magnetic resonance imaging), TENS units (transcutaneous nerve stimulators).  Call the office for questions about other devices. Avoid electrical appliances that are in poor condition or are not properly grounded. Microwave ovens are safe to be near or to operate.

## 2022-05-17 NOTE — Progress Notes (Addendum)
Rounding Note    Patient Name: Brian Klein Date of Encounter: 05/17/2022  [place] Cardiologist: Candee Furbish, MD   Subjective   Feels well  Inpatient Medications    Scheduled Meds:  amLODipine  5 mg Oral Daily   ezetimibe  10 mg Oral QHS   And   simvastatin  40 mg Oral QHS   irbesartan  150 mg Oral Daily   rivaroxaban  20 mg Oral Q supper   sodium chloride flush  3 mL Intravenous Q12H   Continuous Infusions:  PRN Meds: acetaminophen, ondansetron (ZOFRAN) IV, mouth rinse, sodium chloride flush   Vital Signs    Vitals:   05/16/22 1959 05/16/22 2341 05/17/22 0350 05/17/22 0720  BP: 117/77 124/88 117/74 118/84  Pulse: (!) 104 81 74 79  Resp: 18 15 (!) 21 19  Temp: 98.3 F (36.8 C) 97.9 F (36.6 C) 97.9 F (36.6 C) 97.9 F (36.6 C)  TempSrc: Oral Oral Oral Oral  SpO2: 94% 95% 94% 96%  Weight:      Height:        Intake/Output Summary (Last 24 hours) at 05/17/2022 9417 Last data filed at 05/17/2022 4081 Gross per 24 hour  Intake 483 ml  Output 600 ml  Net -117 ml      05/14/2022   12:05 AM 05/13/2022    5:34 PM 05/12/2022    4:00 AM  Last 3 Weights  Weight (lbs) 154 lb 15.7 oz 158 lb 11.7 oz 159 lb 6.4 oz  Weight (kg) 70.3 kg 72 kg 72.303 kg      Telemetry    Atrial fibrillation  mostly 80s, no brady or sustained tachy rates with/without BBB morphology - personally reviewed  ECG    None new  Physical Exam   GEN: Well nourished, well developed, in no acute distress  HEENT: normal  Neck: no JVD, carotid bruits, or masses Cardiac: irreg-irreg,  no murmurs, rubs, or gallops,no edema  Respiratory:  CTA b/l, normal work of breathing GI: soft, nontender, nondistended MS: no deformity or atrophy  Skin: warm and dry Neuro:  Strength and sensation are intact Psych: euthymic mood, full affect   Labs    High Sensitivity Troponin:   Recent Labs  Lab 05/13/22 1740 05/13/22 1950  TROPONINIHS 56* 53*     Chemistry Recent Labs   Lab 05/12/22 0143 05/13/22 1740 05/14/22 0617 05/15/22 0030  NA 137 135 135 138  K 3.6 3.7 3.6 4.2  CL 106 105 106 108  CO2 '22 25 22 '$ 21*  GLUCOSE 93 128* 101* 102*  BUN '13 13 9 11  '$ CREATININE 1.08 1.02 0.88 0.91  CALCIUM 8.9 8.4* 8.6* 8.9  MG 1.9  --   --   --   PROT  --  7.6  --   --   ALBUMIN  --  4.0  --   --   AST  --  28  --   --   ALT  --  13  --   --   ALKPHOS  --  62  --   --   BILITOT  --  1.0  --   --   GFRNONAA >60 >60 >60 >60  ANIONGAP '9 5 7 9    '$ Lipids No results for input(s): "CHOL", "TRIG", "HDL", "LABVLDL", "LDLCALC", "CHOLHDL" in the last 168 hours.  Hematology Recent Labs  Lab 05/12/22 0143 05/13/22 1740 05/14/22 0617  WBC 11.0* 10.4 8.4  RBC 5.00 4.75 4.45  HGB 12.9* 12.4* 11.8*  HCT 39.2 37.0* 34.2*  MCV 78.4* 77.9* 76.9*  MCH 25.8* 26.1 26.5  MCHC 32.9 33.5 34.5  RDW 14.5 14.6 14.5  PLT 100* 78* 71*   Thyroid No results for input(s): "TSH", "FREET4" in the last 168 hours.  BNP Recent Labs  Lab 05/14/22 0617  BNP 323.6*    DDimer No results for input(s): "DDIMER" in the last 168 hours.   Radiology    No results found.  Cardiac Studies   TTE 05/14/22  1. S/P TAVR with mild AI; mean gradient 7 mmHg; findings similar to  previous study 05/12/22.   2. Left ventricular ejection fraction, by estimation, is 60 to 65%. The  left ventricle has normal function. The left ventricle has no regional  wall motion abnormalities. There is mild left ventricular hypertrophy of  the basal-septal segment. Left  ventricular diastolic function could not be evaluated.   3. Right ventricular systolic function is normal. The right ventricular  size is mildly enlarged. There is mildly elevated pulmonary artery  systolic pressure.   4. Left atrial size was mildly dilated.   5. Right atrial size was moderately dilated.   6. The mitral valve is normal in structure. Mild mitral valve  regurgitation. No evidence of mitral stenosis. Moderate mitral annular   calcification.   7. Tricuspid valve regurgitation is moderate.   8. The aortic valve has been repaired/replaced. Aortic valve  regurgitation is mild. No aortic stenosis is present. There is a 26 mm  Sapien prosthetic (TAVR) valve present in the aortic position.   9. The inferior vena cava is normal in size with greater than 50%  respiratory variability, suggesting right atrial pressure of 3 mmHg.   Patient Profile     76 y.o. male with a history of severe aortic stenosis, permanent atrial fibrillation post TAVR who presented to the hospital with syncope.  Assessment & Plan     1.  Near syncope ? BP, vagal 2.  New LBBB post TAVR No bradycardia noted on telemetry, particularly none with mild episode of dizziness over the weekend.  Dr. Lovena Le has seen the patient this AM, discussed options of ILR vs PPM and rational for each, discussed procedures as well. The pt his wife will gve both management strategies some thought this AM, he is leaning towards PPM NPO for now   3.  Permanent atrial fibrillation:  CHA2DS2-VASc of 3 Eliquis held for possible procedures  4.  VHD  Status post TAVR 05/11/22  5.  Hypertension:  Currently well controlled     For questions or updates, please contact [place] Please consult [email or web address] for contact info under        Signed, Baldwin [place], PA-C  05/17/2022, 8:08 AM    EP Attending  Patient seen and examined. Discussed with Dr. Curt Bears and the patient and his wife. He has not had any prolonged pauses. He initially mentioned that he had a mild spell on Saturday but then decided he did not. He did not have any prolonged pauses. I have discussed the indications/risks/benefits/goals/expectations of ILR insertion vs single chamber PPM and he wishes to proceed with the PPM.  Salome Spotted

## 2022-05-17 NOTE — Interval H&P Note (Signed)
History and Physical Interval Note:  05/17/2022 2:24 PM  Brian Klein  has presented today for surgery, with the diagnosis of heart block.  The various methods of treatment have been discussed with the patient and family. After consideration of risks, benefits and other options for treatment, the patient has consented to  Procedure(s): PACEMAKER IMPLANT (N/A) as a surgical intervention.  The patient's history has been reviewed, patient examined, no change in status, stable for surgery.  I have reviewed the patient's chart and labs.  Questions were answered to the patient's satisfaction.     Cristopher Peru

## 2022-05-17 NOTE — H&P (View-Only) (Signed)
Rounding Note    Patient Name: Brian Klein Date of Encounter: 05/17/2022  [place] Cardiologist: Candee Furbish, MD   Subjective   Feels well  Inpatient Medications    Scheduled Meds:  amLODipine  5 mg Oral Daily   ezetimibe  10 mg Oral QHS   And   simvastatin  40 mg Oral QHS   irbesartan  150 mg Oral Daily   rivaroxaban  20 mg Oral Q supper   sodium chloride flush  3 mL Intravenous Q12H   Continuous Infusions:  PRN Meds: acetaminophen, ondansetron (ZOFRAN) IV, mouth rinse, sodium chloride flush   Vital Signs    Vitals:   05/16/22 1959 05/16/22 2341 05/17/22 0350 05/17/22 0720  BP: 117/77 124/88 117/74 118/84  Pulse: (!) 104 81 74 79  Resp: 18 15 (!) 21 19  Temp: 98.3 F (36.8 C) 97.9 F (36.6 C) 97.9 F (36.6 C) 97.9 F (36.6 C)  TempSrc: Oral Oral Oral Oral  SpO2: 94% 95% 94% 96%  Weight:      Height:        Intake/Output Summary (Last 24 hours) at 05/17/2022 3382 Last data filed at 05/17/2022 5053 Gross per 24 hour  Intake 483 ml  Output 600 ml  Net -117 ml      05/14/2022   12:05 AM 05/13/2022    5:34 PM 05/12/2022    4:00 AM  Last 3 Weights  Weight (lbs) 154 lb 15.7 oz 158 lb 11.7 oz 159 lb 6.4 oz  Weight (kg) 70.3 kg 72 kg 72.303 kg      Telemetry    Atrial fibrillation  mostly 80s, no brady or sustained tachy rates with/without BBB morphology - personally reviewed  ECG    None new  Physical Exam   GEN: Well nourished, well developed, in no acute distress  HEENT: normal  Neck: no JVD, carotid bruits, or masses Cardiac: irreg-irreg,  no murmurs, rubs, or gallops,no edema  Respiratory:  CTA b/l, normal work of breathing GI: soft, nontender, nondistended MS: no deformity or atrophy  Skin: warm and dry Neuro:  Strength and sensation are intact Psych: euthymic mood, full affect   Labs    High Sensitivity Troponin:   Recent Labs  Lab 05/13/22 1740 05/13/22 1950  TROPONINIHS 56* 53*     Chemistry Recent Labs   Lab 05/12/22 0143 05/13/22 1740 05/14/22 0617 05/15/22 0030  NA 137 135 135 138  K 3.6 3.7 3.6 4.2  CL 106 105 106 108  CO2 '22 25 22 '$ 21*  GLUCOSE 93 128* 101* 102*  BUN '13 13 9 11  '$ CREATININE 1.08 1.02 0.88 0.91  CALCIUM 8.9 8.4* 8.6* 8.9  MG 1.9  --   --   --   PROT  --  7.6  --   --   ALBUMIN  --  4.0  --   --   AST  --  28  --   --   ALT  --  13  --   --   ALKPHOS  --  62  --   --   BILITOT  --  1.0  --   --   GFRNONAA >60 >60 >60 >60  ANIONGAP '9 5 7 9    '$ Lipids No results for input(s): "CHOL", "TRIG", "HDL", "LABVLDL", "LDLCALC", "CHOLHDL" in the last 168 hours.  Hematology Recent Labs  Lab 05/12/22 0143 05/13/22 1740 05/14/22 0617  WBC 11.0* 10.4 8.4  RBC 5.00 4.75 4.45  HGB 12.9* 12.4* 11.8*  HCT 39.2 37.0* 34.2*  MCV 78.4* 77.9* 76.9*  MCH 25.8* 26.1 26.5  MCHC 32.9 33.5 34.5  RDW 14.5 14.6 14.5  PLT 100* 78* 71*   Thyroid No results for input(s): "TSH", "FREET4" in the last 168 hours.  BNP Recent Labs  Lab 05/14/22 0617  BNP 323.6*    DDimer No results for input(s): "DDIMER" in the last 168 hours.   Radiology    No results found.  Cardiac Studies   TTE 05/14/22  1. S/P TAVR with mild AI; mean gradient 7 mmHg; findings similar to  previous study 05/12/22.   2. Left ventricular ejection fraction, by estimation, is 60 to 65%. The  left ventricle has normal function. The left ventricle has no regional  wall motion abnormalities. There is mild left ventricular hypertrophy of  the basal-septal segment. Left  ventricular diastolic function could not be evaluated.   3. Right ventricular systolic function is normal. The right ventricular  size is mildly enlarged. There is mildly elevated pulmonary artery  systolic pressure.   4. Left atrial size was mildly dilated.   5. Right atrial size was moderately dilated.   6. The mitral valve is normal in structure. Mild mitral valve  regurgitation. No evidence of mitral stenosis. Moderate mitral annular   calcification.   7. Tricuspid valve regurgitation is moderate.   8. The aortic valve has been repaired/replaced. Aortic valve  regurgitation is mild. No aortic stenosis is present. There is a 26 mm  Sapien prosthetic (TAVR) valve present in the aortic position.   9. The inferior vena cava is normal in size with greater than 50%  respiratory variability, suggesting right atrial pressure of 3 mmHg.   Patient Profile     76 y.o. male with a history of severe aortic stenosis, permanent atrial fibrillation post TAVR who presented to the hospital with syncope.  Assessment & Plan     1.  Near syncope ? BP, vagal 2.  New LBBB post TAVR No bradycardia noted on telemetry, particularly none with mild episode of dizziness over the weekend.  Dr. Lovena Le has seen the patient this AM, discussed options of ILR vs PPM and rational for each, discussed procedures as well. The pt his wife will gve both management strategies some thought this AM, he is leaning towards PPM NPO for now   3.  Permanent atrial fibrillation:  CHA2DS2-VASc of 3 Eliquis held for possible procedures  4.  VHD  Status post TAVR 05/11/22  5.  Hypertension:  Currently well controlled     For questions or updates, please contact [place] Please consult [email or web address] for contact info under        Signed, Baldwin Jamaica, PA-C  05/17/2022, 8:08 AM    EP Attending  Patient seen and examined. Discussed with Dr. Curt Bears and the patient and his wife. He has not had any prolonged pauses. He initially mentioned that he had a mild spell on Saturday but then decided he did not. He did not have any prolonged pauses. I have discussed the indications/risks/benefits/goals/expectations of ILR insertion vs single chamber PPM and he wishes to proceed with the PPM.  Salome Spotted

## 2022-05-18 ENCOUNTER — Encounter (HOSPITAL_COMMUNITY): Payer: Self-pay | Admitting: Internal Medicine

## 2022-05-18 ENCOUNTER — Inpatient Hospital Stay (HOSPITAL_COMMUNITY): Payer: Medicare PPO

## 2022-05-18 ENCOUNTER — Other Ambulatory Visit (HOSPITAL_COMMUNITY): Payer: Self-pay

## 2022-05-18 DIAGNOSIS — Z006 Encounter for examination for normal comparison and control in clinical research program: Secondary | ICD-10-CM | POA: Diagnosis not present

## 2022-05-18 DIAGNOSIS — I97190 Other postprocedural cardiac functional disturbances following cardiac surgery: Secondary | ICD-10-CM | POA: Diagnosis not present

## 2022-05-18 DIAGNOSIS — R55 Syncope and collapse: Secondary | ICD-10-CM | POA: Diagnosis not present

## 2022-05-18 DIAGNOSIS — Z953 Presence of xenogenic heart valve: Secondary | ICD-10-CM | POA: Diagnosis not present

## 2022-05-18 DIAGNOSIS — I4821 Permanent atrial fibrillation: Secondary | ICD-10-CM | POA: Diagnosis not present

## 2022-05-18 DIAGNOSIS — I447 Left bundle-branch block, unspecified: Secondary | ICD-10-CM | POA: Diagnosis not present

## 2022-05-18 MED ORDER — METOPROLOL SUCCINATE ER 25 MG PO TB24
25.0000 mg | ORAL_TABLET | Freq: Every day | ORAL | 6 refills | Status: DC
  Filled 2022-05-18: qty 30, 30d supply, fill #0

## 2022-05-18 NOTE — Discharge Summary (Cosign Needed)
DISCHARGE SUMMARY    Patient ID: Brian Klein,  MRN: [id number], DOB/AGE: 18-Mar-1946 76 y.o.  Admit date: 05/13/2022 Discharge date: 05/18/2022  Primary Care Physician: Crist Infante, MD  Primary Cardiologist: Dr. Marlou Porch Structural: Dr. Ali Lowe Electrophysiologist: new, dr. Lovena Le  Primary Discharge Diagnosis:  Near syncope New LBBB, s/p TAVR  Secondary Discharge Diagnosis:  Permanent AFib VHD S/p TAVR 05/12/22 HTN  Allergies  Allergen Reactions   Crestor [Rosuvastatin] Other (See Comments)    Legs aches and pain      Procedures This Admission:  1.  Implantation of a BSCi dual chamber PPM on 05/17/22 by Dr Lovena Le.   See procedure report for full details There were no immediate post procedure complications. 2.  CXR on 05/19/22 demonstrated no pneumothorax status post device implantation.   Brief HPI: Brian Klein is a 76 y.o. male was home s/p TAVR on 05/11/22, discharged on 05/12/22, doing well, though the following morning on 05/13/22 had a near syncopal event, had finished eating and was washing up, felt lightheaeded, near fainting and sat on the floor. He had another spell after showing as well, and came in for evaluation. Labs were unrevealing though did have new LBBB and admitted for further evaluation, his home metoprolol held  Hospital Course:  The patient was admitted and monitored on telemetry.  No significant bradycardia was observed, he did have some transient faster AFib rates.  He did have one mild episode of feeling a little lightheaded without brady or tachy noted.   Highly suspect that with new LBBB post TAVR that he may have had symptomatic bradycardia at home though.  Discussed treatment options with the patient and  rational for both a ILR vs PPM. The patient and his wife both felt best pursuing PPM.  He underwent implantation of a PPM with details as outlined above.  He was monitored on telemetry throughout his stay.  Left chest was  without hematoma or ecchymosis.  The device was interrogated and found to be functioning normally.  CXR was obtained and demonstrated no pneumothorax status post device implantation.  Wound care, arm mobility, and restrictions were reviewed with the patient.  The patient feels a little weak in general, though well otherwise, denies any P/SOB, with minimal site discomfort.  He was examined by Dr. Lovena Le and considered stable for discharge to home.   He was on Xarelto '20mg'$  daily pre-TAVR and had supply at home, had been recommended to transition to Eliquis '5mg'$  BID once he completed his home supply of Xarelto. We will continue with that plan and was discussed with the patient/his wife at bedside  He is to resume Xarelto Saturday 05/22/22 evening If he decides to transition to the Eliquis to start on Sunday 05/23/22 morning  WE are increasing his home Toprol to '25mg'$  daily   Physical Exam: Vitals:   05/17/22 1923 05/17/22 2310 05/18/22 0332 05/18/22 0834  BP: 109/87 96/83 123/83 112/85  Pulse: 88 72 72 80  Resp: '19 19 18 20  '$ Temp: 98.5 F (36.9 C) 97.9 F (36.6 C) 97.6 F (36.4 C) 98 F (36.7 C)  TempSrc: Oral Oral Oral Oral  SpO2: 98% 96% 99%   Weight:      Height:        GEN- The patient is well appearing, alert and oriented x 3 today.   HEENT: normocephalic, atraumatic; sclera clear, conjunctiva pink; hearing intact; oropharynx clear; neck supple, no JVP Lungs- CTA b/l, normal work  of breathing.  No wheezes, rales, rhonchi Heart- irreg-irreg, no murmurs, rubs or gallops, PMI not laterally displaced GI- soft, non-tender, non-distended Extremities- no clubbing, cyanosis, or edema MS- no significant deformity or atrophy Skin- warm and dry, no rash or lesion,  left chest without hematoma/ecchymosis Psych- euthymic mood, full affect Neuro- no gross deficits   Labs:   Lab Results  Component Value Date   WBC 8.4 05/14/2022   HGB 11.8 (L) 05/14/2022   HCT 34.2 (L) 05/14/2022    MCV 76.9 (L) 05/14/2022   PLT 71 (L) 05/14/2022    Recent Labs  Lab 05/13/22 1740 05/14/22 0617 05/15/22 0030  NA 135   < > 138  K 3.7   < > 4.2  CL 105   < > 108  CO2 25   < > 21*  BUN 13   < > 11  CREATININE 1.02   < > 0.91  CALCIUM 8.4*   < > 8.9  PROT 7.6  --   --   BILITOT 1.0  --   --   ALKPHOS 62  --   --   ALT 13  --   --   AST 28  --   --   GLUCOSE 128*   < > 102*   < > = values in this interval not displayed.    Discharge Medications:  Allergies as of 05/18/2022       Reactions   Crestor [rosuvastatin] Other (See Comments)   Legs aches and pain         Medication List     TAKE these medications    acetaminophen 500 MG tablet Commonly known as: TYLENOL Take 1,000 mg by mouth daily as needed (pain).   amLODipine 5 MG tablet Commonly known as: NORVASC Take 5 mg by mouth daily.   amoxicillin 500 MG capsule Commonly known as: AMOXIL TAKE 4 CAPSULES BY MOUTH 1 HOUR BEFORE DENTAL APPOINTMENT   Belsomra 20 MG Tabs Generic drug: Suvorexant Take 20 mg by mouth at bedtime.   Co Q 10 100 MG Caps Take 100 mg by mouth daily.   Eliquis 5 MG Tabs tablet Generic drug: apixaban Take 1 tablet (5 mg total) by mouth 2 (two) times daily. (Begin after Xarelto completed). What changed: Another medication with the same name was removed. Continue taking this medication, and follow the directions you see here. Notes to patient: START this once you have completed you home supply of Xarelto, though not before 05/23/22 as we discussed   ezetimibe-simvastatin 10-40 MG tablet Commonly known as: VYTORIN Take 1 tablet by mouth daily.   fish oil-omega-3 fatty acids 1000 MG capsule Take 1 g by mouth daily.   fluticasone 50 MCG/ACT nasal spray Commonly known as: FLONASE Place 1 spray into both nostrils daily as needed for allergies or rhinitis.   metoprolol succinate 25 MG 24 hr tablet Commonly known as: TOPROL-XL Take 1 tablet (25 mg total) by mouth daily. What  changed: See the new instructions.   metroNIDAZOLE 0.75 % gel Commonly known as: METROGEL Apply 1 Application topically 2 (two) times daily as needed (rosacea).   olmesartan 20 MG tablet Commonly known as: BENICAR Take 20 mg by mouth daily.   rivaroxaban 20 MG Tabs tablet Commonly known as: XARELTO Take 20 mg by mouth daily. Notes to patient: Do NOT resume until 05/22/22 ecening   Vitamin D 50 MCG (2000 UT) tablet Take 2,000 Units by mouth daily.  Disposition: Home Discharge Instructions     Diet - low sodium heart healthy   Complete by: As directed    Increase activity slowly   Complete by: As directed         Duration of Discharge Encounter: Greater than 30 minutes including physician time.  Venetia Night, PA-C 05/18/2022 9:09 AM  EP Attending  Patient seen and examined. Agree with above. The patient is stable for DC home. His CXR looks good. Device interrogation under my direction demonstrates normal VVI PM function. Usual followup.  Carleene Overlie Linnette Panella,MD

## 2022-05-18 NOTE — Progress Notes (Signed)
Mobility Specialist Progress Note    05/18/22 0924  Mobility  Activity Ambulated with assistance in hallway  Level of Assistance Contact guard assist, steadying assist  Assistive Device Other (Comment) (HHA)  Distance Ambulated (ft) 300 ft  Activity Response Tolerated well  $Mobility charge 1 Mobility   Pre-Mobility: 76 HR, 118/76 (91) BP, 99% SpO2 During Mobility: 103 HR, 93% SpO2 Post-Mobility: 77 HR, 109/76 (88) BP, 99% SpO2  Pt received sitting EOB and agreeable. No complaints on walk. Returned to sitting EOB with call bell in reach.    Hildred Alamin Mobility Specialist  Secure Chat Only

## 2022-05-18 NOTE — Care Management Important Message (Signed)
Important Message  Patient Details  Name: Brian Klein MRN: [id number] Date of Birth: 05/26/1946   Medicare Important Message Given:  Yes     Orbie Pyo 05/18/2022, 2:13 PM

## 2022-05-20 ENCOUNTER — Encounter: Payer: Self-pay | Admitting: Internal Medicine

## 2022-05-21 ENCOUNTER — Telehealth: Payer: Self-pay

## 2022-05-21 NOTE — Telephone Encounter (Signed)
Pt called regarding [facility] triage message.    Pt stated yesterday 05/20/2022 at 315 pm he felt dizzy and had nausea for 5-10 seconds.  The symptoms then went away.    Pt was asymptomatic at time of call.  Pt had PPM placed with Dr. Lovena Le on 05/17/22.  Pt also has TAVR.    Transmission sent to [facility] Device team 05/21/22 at 316 pm today.    Pt has appointment with Dr. Lovena Le and Device Team on 05/27/2022, Thursday.    Device Team aware of his concern, and will review the transmission.  Follow up may be required.

## 2022-05-21 NOTE — Telephone Encounter (Signed)
I got the transmission from the patient. I let him know that the nurse will review it and either Brian Klein or Brian Klein will give him a call back.

## 2022-05-21 NOTE — Telephone Encounter (Signed)
Pt called back and made aware no abnormal events were recorded on his device.  The report was reviewed by [facility] Device Team.   Pt also advised, if any extreme events were to occur over the weekend while we were closed, and he became symptomatic with no relief, to not ignore the symptoms and go to nearest ER over the weekend. Pt told if he has another brief 5-10 sec episode that goes away, to document and share with [place] during the 11/16 upcoming appointment with Dr. Lovena Le.  Pt understood advisement, and will f/u with [place] next week.

## 2022-05-21 NOTE — Telephone Encounter (Signed)
Transmission received and reviewed.  Presenting rhythm was NSR with 28% V pacing.  No episodes that correlate with dizzy spell.  Please keep appt next week with device clinic.   Merrilee Seashore, RN aware and will follow up with patient to communicate.

## 2022-05-24 DIAGNOSIS — Z950 Presence of cardiac pacemaker: Secondary | ICD-10-CM | POA: Diagnosis not present

## 2022-05-24 DIAGNOSIS — D696 Thrombocytopenia, unspecified: Secondary | ICD-10-CM | POA: Diagnosis not present

## 2022-05-24 DIAGNOSIS — I480 Paroxysmal atrial fibrillation: Secondary | ICD-10-CM | POA: Diagnosis not present

## 2022-05-24 DIAGNOSIS — G43109 Migraine with aura, not intractable, without status migrainosus: Secondary | ICD-10-CM | POA: Diagnosis not present

## 2022-05-24 DIAGNOSIS — I350 Nonrheumatic aortic (valve) stenosis: Secondary | ICD-10-CM | POA: Diagnosis not present

## 2022-05-24 DIAGNOSIS — D6869 Other thrombophilia: Secondary | ICD-10-CM | POA: Diagnosis not present

## 2022-05-24 DIAGNOSIS — I10 Essential (primary) hypertension: Secondary | ICD-10-CM | POA: Diagnosis not present

## 2022-05-27 ENCOUNTER — Ambulatory Visit: Payer: Medicare PPO | Attending: Cardiovascular Disease

## 2022-05-27 DIAGNOSIS — R55 Syncope and collapse: Secondary | ICD-10-CM

## 2022-05-27 LAB — CUP PACEART INCLINIC DEVICE CHECK
Date Time Interrogation Session: 20231116165958
Implantable Lead Connection Status: 753985
Implantable Lead Implant Date: 20231106
Implantable Lead Location: 753860
Implantable Lead Model: 7842
Implantable Lead Serial Number: 1220619
Implantable Pulse Generator Implant Date: 20231106
Lead Channel Impedance Value: 729 Ohm
Lead Channel Pacing Threshold Amplitude: 0.7 V
Lead Channel Pacing Threshold Pulse Width: 0.4 ms
Lead Channel Sensing Intrinsic Amplitude: 5.3 mV
Lead Channel Setting Pacing Amplitude: 3.5 V
Lead Channel Setting Pacing Pulse Width: 0.4 ms
Lead Channel Setting Sensing Sensitivity: 2.5 mV
Pulse Gen Serial Number: 868568
Zone Setting Status: 755011

## 2022-05-27 NOTE — Progress Notes (Signed)

## 2022-05-27 NOTE — Patient Instructions (Addendum)

## 2022-06-04 ENCOUNTER — Emergency Department (HOSPITAL_BASED_OUTPATIENT_CLINIC_OR_DEPARTMENT_OTHER): Payer: Medicare PPO

## 2022-06-04 ENCOUNTER — Emergency Department (HOSPITAL_BASED_OUTPATIENT_CLINIC_OR_DEPARTMENT_OTHER)
Admission: EM | Admit: 2022-06-04 | Discharge: 2022-06-04 | Disposition: A | Discharge status: Home / Self Care | Payer: Medicare PPO | Attending: Emergency Medicine | Admitting: Emergency Medicine

## 2022-06-04 ENCOUNTER — Other Ambulatory Visit: Payer: Self-pay

## 2022-06-04 ENCOUNTER — Encounter (HOSPITAL_BASED_OUTPATIENT_CLINIC_OR_DEPARTMENT_OTHER): Payer: Self-pay | Admitting: Emergency Medicine

## 2022-06-04 DIAGNOSIS — Z79899 Other long term (current) drug therapy: Secondary | ICD-10-CM | POA: Diagnosis not present

## 2022-06-04 DIAGNOSIS — I10 Essential (primary) hypertension: Secondary | ICD-10-CM | POA: Diagnosis not present

## 2022-06-04 DIAGNOSIS — I4891 Unspecified atrial fibrillation: Secondary | ICD-10-CM | POA: Diagnosis not present

## 2022-06-04 DIAGNOSIS — I6523 Occlusion and stenosis of bilateral carotid arteries: Secondary | ICD-10-CM | POA: Diagnosis not present

## 2022-06-04 DIAGNOSIS — Z7901 Long term (current) use of anticoagulants: Secondary | ICD-10-CM | POA: Insufficient documentation

## 2022-06-04 DIAGNOSIS — Z950 Presence of cardiac pacemaker: Secondary | ICD-10-CM | POA: Insufficient documentation

## 2022-06-04 DIAGNOSIS — R413 Other amnesia: Secondary | ICD-10-CM | POA: Diagnosis not present

## 2022-06-04 LAB — CBC WITH DIFFERENTIAL/PLATELET
Abs Immature Granulocytes: 0.03 10*3/uL (ref 0.00–0.07)
Basophils Absolute: 0 10*3/uL (ref 0.0–0.1)
Basophils Relative: 0 %
Eosinophils Absolute: 0 10*3/uL (ref 0.0–0.5)
Eosinophils Relative: 0 %
HCT: 40.2 % (ref 39.0–52.0)
Hemoglobin: 13.4 g/dL (ref 13.0–17.0)
Immature Granulocytes: 1 %
Lymphocytes Relative: 28 %
Lymphs Abs: 1.6 10*3/uL (ref 0.7–4.0)
MCH: 26 pg (ref 26.0–34.0)
MCHC: 33.3 g/dL (ref 30.0–36.0)
MCV: 77.9 fL — ABNORMAL LOW (ref 80.0–100.0)
Monocytes Absolute: 1.1 10*3/uL — ABNORMAL HIGH (ref 0.1–1.0)
Monocytes Relative: 19 %
Neutro Abs: 2.9 10*3/uL (ref 1.7–7.7)
Neutrophils Relative %: 52 %
Platelets: 116 10*3/uL — ABNORMAL LOW (ref 150–400)
RBC: 5.16 MIL/uL (ref 4.22–5.81)
RDW: 14.3 % (ref 11.5–15.5)
WBC: 5.6 10*3/uL (ref 4.0–10.5)
nRBC: 0 % (ref 0.0–0.2)

## 2022-06-04 LAB — URINALYSIS, MICROSCOPIC (REFLEX)
Squamous Epithelial / HPF: NONE SEEN (ref 0–5)
WBC, UA: NONE SEEN WBC/hpf (ref 0–5)

## 2022-06-04 LAB — URINALYSIS, ROUTINE W REFLEX MICROSCOPIC
Bilirubin Urine: NEGATIVE
Glucose, UA: NEGATIVE mg/dL
Ketones, ur: NEGATIVE mg/dL
Leukocytes,Ua: NEGATIVE
Nitrite: NEGATIVE
Protein, ur: NEGATIVE mg/dL
Specific Gravity, Urine: 1.01 (ref 1.005–1.030)
pH: 6 (ref 5.0–8.0)

## 2022-06-04 LAB — COMPREHENSIVE METABOLIC PANEL
ALT: 18 U/L (ref 0–44)
AST: 27 U/L (ref 15–41)
Albumin: 4.5 g/dL (ref 3.5–5.0)
Alkaline Phosphatase: 81 U/L (ref 38–126)
Anion gap: 6 (ref 5–15)
BUN: 17 mg/dL (ref 8–23)
CO2: 24 mmol/L (ref 22–32)
Calcium: 9 mg/dL (ref 8.9–10.3)
Chloride: 107 mmol/L (ref 98–111)
Creatinine, Ser: 1.1 mg/dL (ref 0.61–1.24)
GFR, Estimated: >60 mL/min (ref >60)
Glucose, Bld: 96 mg/dL (ref 70–99)
Potassium: 4.3 mmol/L (ref 3.5–5.1)
Sodium: 137 mmol/L (ref 135–145)
Total Bilirubin: 0.7 mg/dL (ref 0.3–1.2)
Total Protein: 8.1 g/dL (ref 6.5–8.1)

## 2022-06-04 MED ORDER — IOHEXOL 350 MG/ML SOLN
75.0000 mL | Freq: Once | INTRAVENOUS | Status: AC | PRN
  Administered 2022-06-04: 75 mL via INTRAVENOUS

## 2022-06-04 MED ORDER — LORAZEPAM 1 MG PO TABS: 0.5000 mg | ORAL_TABLET | Freq: Once | ORAL | Status: DC

## 2022-06-04 NOTE — ED Provider Notes (Signed)
[facility] Provider Note   CSN: [id number] Arrival date & time: 06/04/22  1547     History  Chief Complaint  Patient presents with   Memory Loss    Brian Klein is a 76 y.o. male.  Patient is a 76 year old male with a past medical history of A-fib on Eliquis, recent TAVR 2 weeks ago followed by pacemaker placement and hypertension presenting to the emergency department with an episode of memory loss.  The patient states that he was improving since his pacemaker placement with only occasional episodes of palpitations and lightheadedness.  He states however today he was looking at his calendar when his wife when he suddenly forgot what he was doing.  His wife states that the episode lasted about 10 minutes and he could not remember anything about the last 24 hours.  She states that his speech sounded clear at this time and had no evidence of facial droop.  The patient denied any numbness or weakness.  He denies any headaches or chest pain.  He states that he now feels back to his baseline.  He states he has been taking all his medications as prescribed.  He denies any recent fevers or chills, nausea or vomiting, diarrhea or constipation, dysuria or hematuria.  The history is provided by the patient and the spouse.       Home Medications Prior to Admission medications   Medication Sig Start Date End Date Taking? Authorizing Provider  acetaminophen (TYLENOL) 500 MG tablet Take 1,000 mg by mouth daily as needed (pain).    [provider]  amLODipine (NORVASC) 5 MG tablet Take 5 mg by mouth daily.  08/17/18   [provider]  amoxicillin (AMOXIL) 500 MG capsule TAKE 4 CAPSULES BY MOUTH 1 HOUR BEFORE DENTAL APPOINTMENT 09/04/21   Jerline Pain, MD  apixaban (ELIQUIS) 5 MG TABS tablet Take 1 tablet (5 mg total) by mouth 2 (two) times daily. (Begin after Xarelto completed). Patient not taking: Reported on 05/14/2022 05/12/22   Tommie Raymond, NP  BELSOMRA 20 MG TABS Take 20 mg by mouth at bedtime. 01/20/22   [provider]  Cholecalciferol (VITAMIN D) 2000 UNITS tablet Take 2,000 Units by mouth daily.    [provider]  Coenzyme Q10 (CO Q 10) 100 MG CAPS Take 100 mg by mouth daily.    [provider]  ezetimibe-simvastatin (VYTORIN) 10-40 MG per tablet Take 1 tablet by mouth daily.    [provider]  fish oil-omega-3 fatty acids 1000 MG capsule Take 1 g by mouth daily.    [provider]  fluticasone (FLONASE) 50 MCG/ACT nasal spray Place 1 spray into both nostrils daily as needed for allergies or rhinitis.     [provider]  metoprolol succinate (TOPROL-XL) 25 MG 24 hr tablet Take 1 tablet (25 mg total) by mouth daily. 05/18/22   Baldwin Jamaica, PA-C  metroNIDAZOLE (METROGEL) 0.75 % gel Apply 1 Application topically 2 (two) times daily as needed (rosacea). Patient not taking: Reported on 05/14/2022    [provider]  olmesartan (BENICAR) 20 MG tablet Take 20 mg by mouth daily.    [provider]  rivaroxaban (XARELTO) 20 MG TABS tablet Take 20 mg by mouth daily.    [provider]      Allergies    Crestor [rosuvastatin]    Review of Systems   Review of Systems  Physical Exam Updated Vital Signs BP Marland Kitchen)  140/90 (BP Location: Right Arm)   Pulse 72   Temp 98 F (36.7 C) (Oral)   Resp 16   Ht '5\' 6"'$  (1.676 m)   Wt 72.6 kg   SpO2 99%   BMI 25.82 kg/m  Physical Exam Vitals and nursing note reviewed.  Constitutional:      General: He is not in acute distress.    Appearance: Normal appearance.  HENT:     Head: Normocephalic and atraumatic.     Nose: Nose normal.     Mouth/Throat:     Mouth: Mucous membranes are moist.     Pharynx: Oropharynx is clear.  Eyes:     Extraocular Movements: Extraocular movements intact.     Conjunctiva/sclera: Conjunctivae normal.     Pupils: Pupils are equal, round, and reactive to light.   Cardiovascular:     Rate and Rhythm: Normal rate and regular rhythm.     Pulses: Normal pulses.     Heart sounds: Normal heart sounds.  Pulmonary:     Effort: Pulmonary effort is normal.     Breath sounds: Normal breath sounds.  Abdominal:     General: Abdomen is flat.     Palpations: Abdomen is soft.     Tenderness: There is no abdominal tenderness.  Musculoskeletal:        General: Normal range of motion.     Cervical back: Normal range of motion and neck supple.     Right lower leg: No edema.     Left lower leg: No edema.  Skin:    General: Skin is warm and dry.  Neurological:     General: No focal deficit present.     Mental Status: He is alert and oriented to person, place, and time.     Cranial Nerves: No cranial nerve deficit.     Sensory: No sensory deficit.     Motor: No weakness.  Psychiatric:        Mood and Affect: Mood normal.        Behavior: Behavior normal.     ED Results / Procedures / Treatments   Labs (all labs ordered are listed, but only abnormal results are displayed) Labs Reviewed  CBC WITH DIFFERENTIAL/PLATELET - Abnormal; Notable for the following components:      Result Value   MCV 77.9 (*)    Platelets 116 (*)    Monocytes Absolute 1.1 (*)    All other components within normal limits  URINALYSIS, ROUTINE W REFLEX MICROSCOPIC - Abnormal; Notable for the following components:   Color, Urine STRAW (*)    Hgb urine dipstick TRACE (*)    All other components within normal limits  URINALYSIS, MICROSCOPIC (REFLEX) - Abnormal; Notable for the following components:   Bacteria, UA RARE (*)    All other components within normal limits  COMPREHENSIVE METABOLIC PANEL    EKG EKG Interpretation  Date/Time:  Friday June 04 2022 16:00:30 EST Ventricular Rate:  79 PR Interval:    QRS Duration: 92 QT Interval:  350 QTC Calculation: 401 R Axis:   -7 Text Interpretation: Atrial fibrillation with occasional ventricular-paced complexes Incomplete  right bundle branch block Septal infarct , age undetermined Abnormal ECG  No longer ventricularly paced compared to prior EKG Confirmed by Leanord Asal ([id number]) on 06/04/2022 4:13:08 PM  Radiology CT ANGIO HEAD NECK W WO CM  Result Date: 06/04/2022 CLINICAL DATA:  TIA, memory loss EXAM: CT ANGIOGRAPHY HEAD AND NECK TECHNIQUE: Multidetector CT imaging of the head and neck  was performed using the standard protocol during bolus administration of intravenous contrast. Multiplanar CT image reconstructions and MIPs were obtained to evaluate the vascular anatomy. Carotid stenosis measurements (when applicable) are obtained utilizing NASCET criteria, using the distal internal carotid diameter as the denominator. RADIATION DOSE REDUCTION: This exam was performed according to the departmental dose-optimization program which includes automated exposure control, adjustment of the mA and/or kV according to patient size and/or use of iterative reconstruction technique. CONTRAST:  23m OMNIPAQUE IOHEXOL 350 MG/ML SOLN COMPARISON:  No prior CTA, correlation is made with 06/04/2022 CT head FINDINGS: CT HEAD FINDINGS For noncontrast findings, please see same day CT head. CTA NECK FINDINGS Aortic arch: Standard branching. Imaged portion shows no evidence of aneurysm or dissection. No significant stenosis of the major arch vessel origins. Right carotid system: No evidence of dissection, occlusion, or hemodynamically significant stenosis (greater than 50%). Atherosclerotic disease at the bifurcation and in the proximal ICA is not hemodynamically significant. Left carotid system: No evidence of dissection, occlusion, or hemodynamically significant stenosis (greater than 50%). Atherosclerotic disease at the bifurcation and in the proximal ICA is not hemodynamically significant. Vertebral arteries: No evidence of dissection, occlusion, or hemodynamically significant stenosis (greater than 50%). Skeleton: No acute osseous  abnormality. Degenerative changes in the cervical spine. Plate and screw fixation of the medial and left mandible. Other neck: Bilateral thyroid nodules, which were most recently evaluated by ultrasound on 04/17/2021. Upper chest: No focal pulmonary opacity or pleural effusion. Review of the MIP images confirms the above findings CTA HEAD FINDINGS Anterior circulation: Both internal carotid arteries are patent to the termini, without significant stenosis. A1 segments patent. Normal anterior communicating artery. Anterior cerebral arteries are patent to their distal aspects. No M1 stenosis or occlusion. MCA branches perfused and symmetric. Posterior circulation: Vertebral arteries patent to the vertebrobasilar junction without stenosis. Posterior inferior cerebellar artery patent on the left. Prominent right AICA, which supplies the right PICA territory. Basilar patent to its distal aspect. Superior cerebellar arteries patent proximally. Patent P1 segments. PCAs perfused to their distal aspects without stenosis. The bilateral posterior communicating arteries are not visualized. Venous sinuses: As permitted by contrast timing, patent. Anatomic variants: None significant. Review of the MIP images confirms the above findings IMPRESSION: 1. No intracranial large vessel occlusion or significant stenosis. 2. No hemodynamically significant stenosis in the neck. Electronically Signed   By: AMerilyn BabaM.D.   On: 06/04/2022 21:06   CT HEAD WO CONTRAST  Result Date: 06/04/2022 CLINICAL DATA:  Memory loss. EXAM: CT HEAD WITHOUT CONTRAST TECHNIQUE: Contiguous axial images were obtained from the base of the skull through the vertex without intravenous contrast. RADIATION DOSE REDUCTION: This exam was performed according to the departmental dose-optimization program which includes automated exposure control, adjustment of the mA and/or kV according to patient size and/or use of iterative reconstruction technique.  COMPARISON:  Head CT 05/13/2022 brain MRI 01/24/2019 FINDINGS: Brain: There is no evidence for acute hemorrhage, hydrocephalus, mass lesion, or abnormal extra-axial fluid collection. No definite CT evidence for acute infarction. Diffuse loss of parenchymal volume is consistent with atrophy. Patchy low attenuation in the deep hemispheric and periventricular white matter is nonspecific, but likely reflects chronic microvascular ischemic demyelination. Vascular: No hyperdense vessel or unexpected calcification. Skull: Normal. Negative for fracture or focal lesion. Sinuses/Orbits: Postal thickening noted in scattered ethmoid air cells. Remaining visualized paranasal sinuses are clear. Visualized portions of the globes and intraorbital fat are unremarkable. Other: Lucency in the right mandibular condyle again noted, potentially  postsurgical. IMPRESSION: 1. No acute intracranial abnormality. 2. Atrophy with chronic small vessel ischemic disease. Electronically Signed   By: Misty Stanley M.D.   On: 06/04/2022 16:56    Procedures Procedures    Medications Ordered in ED Medications  iohexol (OMNIPAQUE) 350 MG/ML injection 75 mL (75 mLs Intravenous Contrast Given 06/04/22 1957)    ED Course/ Medical Decision Making/ A&P Clinical Course as of 06/04/22 2159  Fri Jun 04, 2022  1808 [facility] without acute disease and labs and urine within normal range. Case will be discussed with neurology for possible TIA. [VK]  1826 I spoke with Dr. Leonel Ramsay of neurology who initially recommends MRA/MRI and if negative can be discharged however after talking to the MRI techs, patient's unable to undergo MRI until 6 weeks postimplantation of his pacemaker.  He discussed this with Dr. Leonel Ramsay who stated CT angio of head and neck can be performed here to evaluate for LVO and if negative he can be discharged and continued on his anticoagulation.  Patient and wife are agreeable with plan.  Patient remains asymptomatic at this time.  [VK]  2130 CTA without acute abnormalities. Patient is stable for discharge home with outpatient primary care and neurology follow up. [VK]    Clinical Course User Index [VK] Kemper Durie, DO                           Medical Decision Making This patient presents to the ED with chief complaint(s) of episodic memory loss with pertinent past medical history of A-fib current focal neurologic deficits making acute CVA unlikely on Eliquis, recent TAVR and pacemaker placement, hypertension which further complicates the presenting complaint. The complaint involves an extensive differential diagnosis and also carries with it a high risk of complications and morbidity.    The differential diagnosis includes ICH, mass effect, electrolyte abnormality, anemia, TIA, no focal deficits at this time making CVA less likely   Additional history obtained: Additional history obtained from spouse Records reviewed previous admission documents  ED Course and Reassessment: Upon patient's arrival to the emergency department he is awake and alert with no focal neurologic deficits at his neurologic baseline.  He will have labs and CT head performed to evaluate for the cause of his episodic memory loss.  EKG was performed on arrival that showed rate controlled A-fib unchanged from his prior.  Independent labs interpretation:  The following labs were independently interpreted: Within normal range  Independent visualization of imaging: - I independently visualized the following imaging with scope of interpretation limited to determining acute life threatening conditions related to emergency care: CT head/CTA, which revealed no acute disease  Consultation: - Consulted or discussed management/test interpretation w/ external professional: Neurology  Consideration for admission or further workup: Patient has no emergent conditions requiring admission or further work-up at this time and is stable for discharge home  with primary care and neurology follow-up Social Determinants of health: N/A   Amount and/or Complexity of Data Reviewed Labs: ordered. Radiology: ordered.  Risk Prescription drug management.          Final Clinical Impression(s) / ED Diagnoses Final diagnoses:  Transient memory loss    Rx / DC Orders ED Discharge Orders          Ordered    Ambulatory referral to Neurology       Comments: An appointment is requested in approximately: 4 weeks   06/04/22 2157  Kemper Durie, DO 06/04/22 2159

## 2022-06-04 NOTE — ED Triage Notes (Signed)
Reported had recent TVAR and pacemaker put in; and is on Xarelto. Reported was sent by PCP for memory loss 45 minutes ago. Stated he was on the computer, when he forgot what he was on there for and no recollection of significant other having a conversation with him along with other details ie; thanksgiving was yesterday. Denies unilateral deficits.

## 2022-06-04 NOTE — ED Notes (Signed)
Pts IV secured pt going to [facility] ER POV, report called to Charge nurse at [facility]

## 2022-06-04 NOTE — ED Notes (Addendum)
TAVR (10/31) and pacemaker implant (11/6) recently. Noticed he had intermittent dizziness and heaviness after returning home from hospital. Today was on the computer and had a moment of confusion. States he couldn't remember why he was on the computer. This occurred ~ 1 hr pta. Denies current dizziness/heaviness.

## 2022-06-04 NOTE — ED Notes (Signed)
To CT

## 2022-06-04 NOTE — Discharge Instructions (Signed)
You were seen in the emergency department for your episode of memory loss.  Your work-up here showed no obvious signs of stroke or narrowing of your blood vessels and you had normal electrolytes and no signs of infection.  It is unclear what caused your symptoms today you should follow-up with your primary doctor and you can follow-up with neurology as well for further work-up if this was a mini stroke.  You should return to the emergency department if you have drooping of your face, slurred speech, numbness or weakness on one side of the body compared to the other, you pass out

## 2022-06-09 ENCOUNTER — Ambulatory Visit: Payer: Medicare PPO | Admitting: Physician Assistant

## 2022-06-09 ENCOUNTER — Telehealth: Payer: Self-pay

## 2022-06-09 NOTE — Telephone Encounter (Signed)
Patients wife reports after valve replacement patient has experienced these symptoms since. Patient is now feeling better and currently laying in bed resting per wife. Remote transmission reviewed and shows normal device function. No episodes noted today. Advised I will forward to general cardiology for follow up.   ED precautions given if patient experiences syncope or symptoms return he needs to go to the ED. Voiced understanding.

## 2022-06-09 NOTE — Telephone Encounter (Signed)
Spoke with pt who has been having episodes of dizziness at random times.  He reports they last about 15-20 seconds and cause him to feel hot.  He also has nausea and fatigue after the dizziness resolves. Pt reports he feels better after eating but is not diabetic.  He does not have episodes when laying down at night or early in the day.  Pt has not checked his VS when dizziness is occurring.  He is scheduled with neurology in the AM for evaluation and also has appts on Friday for 2 D Echo and f/u with the structural heart team s/p TAVR.  He was appreciative of the call back and information.  He will keep appts as scheduled.

## 2022-06-09 NOTE — Telephone Encounter (Signed)
The patient wife states he had another dizzy spell. When he get dizzy he get hot, nauseated, fatigue, and this time he had double vision. He also fell into the couch but did not pass out. I asked them to send a manual transmission for the nurse to look at. They are wanting the pacemaker to be adjusted. I explain to her the Dr. Lovena Le would be the one to make the decision to adjust the pacemaker.

## 2022-06-10 ENCOUNTER — Encounter: Payer: Self-pay | Admitting: Physician Assistant

## 2022-06-10 ENCOUNTER — Ambulatory Visit: Payer: Medicare PPO | Admitting: Physician Assistant

## 2022-06-10 ENCOUNTER — Other Ambulatory Visit (INDEPENDENT_AMBULATORY_CARE_PROVIDER_SITE_OTHER): Payer: Medicare PPO

## 2022-06-10 ENCOUNTER — Ambulatory Visit: Payer: Medicare PPO | Admitting: Cardiology

## 2022-06-10 VITALS — BP 128/79 | HR 95 | Resp 18 | Ht 66.0 in | Wt 156.0 lb

## 2022-06-10 DIAGNOSIS — D649 Anemia, unspecified: Secondary | ICD-10-CM

## 2022-06-10 DIAGNOSIS — E079 Disorder of thyroid, unspecified: Secondary | ICD-10-CM | POA: Diagnosis not present

## 2022-06-10 DIAGNOSIS — G454 Transient global amnesia: Secondary | ICD-10-CM

## 2022-06-10 DIAGNOSIS — R413 Other amnesia: Secondary | ICD-10-CM

## 2022-06-10 DIAGNOSIS — R404 Transient alteration of awareness: Secondary | ICD-10-CM | POA: Diagnosis not present

## 2022-06-10 LAB — CBC
HCT: 39.4 % (ref 39.0–52.0)
Hemoglobin: 13.3 g/dL (ref 13.0–17.0)
MCHC: 33.8 g/dL (ref 30.0–36.0)
MCV: 76.9 fl — ABNORMAL LOW (ref 78.0–100.0)
Platelets: 103 10*3/uL — ABNORMAL LOW (ref 150.0–400.0)
RBC: 5.12 Mil/uL (ref 4.22–5.81)
RDW: 15.1 % (ref 11.5–15.5)
WBC: 6.7 10*3/uL (ref 4.0–10.5)

## 2022-06-10 LAB — FERRITIN: Ferritin: 170 ng/mL (ref 22.0–322.0)

## 2022-06-10 LAB — FOLATE: Folate: >23.8 ng/mL (ref >5.9)

## 2022-06-10 LAB — VITAMIN B12: Vitamin B-12: 697 pg/mL (ref 211–911)

## 2022-06-10 NOTE — Progress Notes (Signed)
[facility] [place]                                     Cardiology Office Note:    Date:  06/11/2022   ID:  Brian Klein, DOB July 20, 1945, MRN [id number]  PCP:  Crist Infante, MD  [facility] Cardiologist:  Candee Furbish, MD  / Dr. Ali Lowe & Dr. Tenny Craw (TAVR)  [place] Electrophysiologist:  None   Referring MD: Crist Infante, MD   1 month s/p TAVR  History of Present Illness:    Brian Klein is a 76 y.o. male with a hx of chronic afib on Xarelto, HTN, HLD, osteosarcoma of jaw s/p remote resection/chemo, mild MR and paradoxical LFLG AS s/p TAVR (05/11/22) who presents to clinic for follow up.  He was seen by Dr. Marlou Porch in 05/2021 and reported some shortness of breath with moderate activity. Echocardiogram 02/2022 showed EF 55% and severe LFLG AS with a mean gradient of 22 mm hg, AVA 0.67cm2, DVI 0.23, SVI 29 as well as mild AI/MR. [facility] 03/26/22 showed mild to moderate obstructive coronary artery disease.    He was evaluated by the multidisciplinary valve team and underwent successful TAVR with a 26 mm Edwards Sapien 3 Ultra Resilia THV via the TF approach on 05/11/22. Post operative echo showed EF 60%, normally functioning TAVR with a mean gradient of 8 mmHg and mild PVL. At discharge his Xarelto was switched to Eliquis. Of note, ECG showed afib with narrow QRS post op.   He then returned to the hospital with dizziness and near syncope. ECG showed afib with new LBBB. He was seen by EP and offered ILR vs PPM and patient ultimately underwent successful implantation of a BSCi dual chamber PPM on 05/17/22 by Dr Lovena Le.     Seen in the ER on 06/04/22 for an episode of memory loss. Labs and CTA head/neck were unremarkable.   He called into our office 11/30 to report ongoing dizziness. Pacemaker was interrogated and unremarkable. He was seen by neuro and brain MRI and EEG ordered.   Today the patient presents to clinic for follow up. He  is here with his wife. He has overall notices an improvement in his breathing but continues to have episodic dizzy spells. These come and go. Today he has felt pretty good but earlier this week he had several in a day.  No CP or SOB. No LE edema, orthopnea or PND.  No blood in stool or urine. No palpitations. Had an episode of what they think was transient global amnesia, which he had about 7 years ago.    Past Medical History:  Diagnosis Date   Abnormal CT scan 11/26/2015   Atrial fibrillation (HCC)    Atrial fibrillation, chronic ([place]) 11/26/2015   AVD (aortic valve disease)    with bicupsid aortic valve   Chronic anticoagulation 11/26/2015   Erectile dysfunction 01/10/2013   Hypercholesterolemia 12/16/2010   Hyperlipidemia    Hypertension    MVP (mitral valve prolapse)    Osteogenic sarcoma ([place]) 1976   jaw bone   S/P TAVR (transcatheter aortic valve replacement) 05/11/2022   s/p TAVR with a 26 mm Edwards S3UR via the TF appoach by Dr. Ali Lowe & Dr. Tenny Craw   Seasonal allergies 11/19/2014    Past Surgical History:  Procedure Laterality Date   EPIGASTRIC HERNIA REPAIR N/A 05/19/2020  Procedure: OPEN EPIGASTRIC HERNIA REPAIR WITH MESH;  Surgeon: Kinsinger, Arta Bruce, MD;  Location: [facility];  Service: General;  Laterality: N/A;   EYELID LACERATION REPAIR  2008   left and right   INTRAOPERATIVE TRANSTHORACIC ECHOCARDIOGRAM N/A 05/11/2022   Procedure: INTRAOPERATIVE TRANSTHORACIC ECHOCARDIOGRAM;  Surgeon: Early Osmond, MD;  Location: [place] [facility];  Service: Open Heart Surgery;  Laterality: N/A;   [place]   right removal -- cancer   MANDIBLE SURGERY  1987   right reconstruction   PACEMAKER IMPLANT N/A 05/17/2022   Procedure: PACEMAKER IMPLANT;  Surgeon: Evans Lance, MD;  Location: [place] [facility];  Service: Cardiovascular;  Laterality: N/A;   RIGHT/LEFT HEART CATH AND CORONARY ANGIOGRAPHY N/A 03/26/2022   Procedure: RIGHT/LEFT HEART CATH AND CORONARY  ANGIOGRAPHY;  Surgeon: Early Osmond, MD;  Location: [place] [facility];  Service: Cardiovascular;  Laterality: N/A;   TRANSCATHETER AORTIC VALVE REPLACEMENT, TRANSFEMORAL N/A 05/11/2022   Procedure: Transcatheter Aortic Valve Replacement, Transfemoral;  Surgeon: Early Osmond, MD;  Location: [place] [facility];  Service: Open Heart Surgery;  Laterality: N/A;   TRANSTHORACIC ECHOCARDIOGRAM  07/18/2002   ef 71%    Current Medications: Current Meds  Medication Sig   acetaminophen (TYLENOL) 500 MG tablet Take 1,000 mg by mouth daily as needed (pain).   amLODipine (NORVASC) 5 MG tablet Take 5 mg by mouth daily.    amoxicillin (AMOXIL) 500 MG capsule TAKE 4 CAPSULES BY MOUTH 1 HOUR BEFORE DENTAL APPOINTMENT   apixaban (ELIQUIS) 5 MG TABS tablet Take 1 tablet (5 mg total) by mouth 2 (two) times daily. (Begin after Xarelto completed).   BELSOMRA 20 MG TABS Take 20 mg by mouth at bedtime.   Cholecalciferol (VITAMIN D) 2000 UNITS tablet Take 2,000 Units by mouth daily.   Coenzyme Q10 (CO Q 10) 100 MG CAPS Take 100 mg by mouth daily.   ezetimibe-simvastatin (VYTORIN) 10-40 MG per tablet Take 1 tablet by mouth daily.   fish oil-omega-3 fatty acids 1000 MG capsule Take 1 g by mouth daily.   fluticasone (FLONASE) 50 MCG/ACT nasal spray Place 1 spray into both nostrils daily as needed for allergies or rhinitis.    metoprolol succinate (TOPROL-XL) 25 MG 24 hr tablet Take 1 tablet (25 mg total) by mouth daily.   metroNIDAZOLE (METROGEL) 0.75 % gel Apply 1 Application topically 2 (two) times daily as needed (rosacea).   olmesartan (BENICAR) 20 MG tablet Take 20 mg by mouth daily.     Allergies:   Crestor [rosuvastatin]   Social History   Socioeconomic History   Marital status: Married    Spouse name: [name]   Number of children: 2   Years of education: 18   Highest education level: Not on file  Occupational History   Occupation: retired  Tobacco Use   Smoking status: Never   Smokeless  tobacco: Never  Vaping Use   Vaping Use: Never used  Substance and Sexual Activity   Alcohol use: No   Drug use: No   Sexual activity: Not on file  Other Topics Concern   Not on file  Social History Narrative   Right handed   Drinks caffeine, tea   One level   Retired   Lives with wife   Social Determinants of Radio broadcast assistant Strain: Not on file  Food Insecurity: Not on file  Transportation Needs: Not on file  Physical Activity: Not on file  Stress: Not on file  Social Connections: Not on file     Family History: The patient's family history includes Heart attack in his father; Hypertension in his father and sister; Stroke in his mother. There is no history of Colon cancer, Stomach cancer, Rectal cancer, or Esophageal cancer.  ROS:   Please see the history of present illness.    All other systems reviewed and are negative.  EKGs/Labs/Other Studies Reviewed:    The following studies were reviewed today:  TAVR OPERATIVE NOTE     Date of Procedure:                05/11/2022   Preoperative Diagnosis:      Severe Aortic Stenosis    Postoperative Diagnosis:    Same    Procedure:        Transcatheter Aortic Valve Replacement - Transfemoral Approach             Edwards Sapien 3 Resilia THV (size 26 mm, model # 9755RLS, serial # [id number])              Co-Surgeons:                         Justice Rocher, MD and Lenna Sciara, MD Anesthesiologist:                  Rochele Pages, DO   Echocardiographer:              Rudean Haskell, MD   Pre-operative Echo Findings: Severe aortic stenosis Normal left ventricular systolic function   Post-operative Echo Findings: Trace paravalvular leak Normal left ventricular systolic function   Left Heart Catheterization Findings: Left ventricular end-diastolic pressure of 54mHg _____________   Echo 05/12/22: IMPRESSIONS   1. The aortic valve has been replaced by a 26 mm Sapien valve. Aortic  valve  regurgitation is mild at most and varies by R-R interval, with a 2D  VCA of 0.06 cm2 at longest RR. It comprises < 10% of the circumferential  extend. Effective orifice area, by  VTI measures 3.16 cm. Aortic valve mean gradient measures 8.0 mmHg. Peak  gradient 17 mm Hg. Normal DVI   2. Left ventricular ejection fraction, by estimation, is 60 to 65%. The  left ventricle has normal function. The left ventricle has no regional  wall motion abnormalities. Left ventricular diastolic parameters are  indeterminate.   3. Right ventricular systolic function is normal. The right ventricular  size is normal. There is mildly elevated pulmonary artery systolic  pressure.   4. Left atrial size was mildly dilated.   5. Right atrial size was severely dilated.   6. The mitral valve is degenerative. Mild mitral valve regurgitation.  Moderate mitral annular calcification.   7. Tricuspid valve regurgitation is moderate.   Comparison(s): Successful TAVR Placement with, at most, mild PVL.   EKG:  EKG is NOT ordered today.    Recent Labs: 05/12/2022: Magnesium 1.9 05/14/2022: B Natriuretic Peptide 323.6 06/04/2022: ALT 18; BUN 17; Creatinine, Ser 1.10; Potassium 4.3; Sodium 137 06/10/2022: Hemoglobin 13.3; Platelets 103.0  Recent Lipid Panel    Component Value Date/Time   CHOL 113 04/22/2020 0900   TRIG 80 04/22/2020 0900   HDL 38 (L) 04/22/2020 0900   CHOLHDL 3.0 04/22/2020 0900   LDLCALC 59 04/22/2020 0900     Risk Assessment/Calculations:       Physical Exam:    VS:  BP 118/70 (BP Location: Left Arm, Patient Position: Sitting, Cuff Size: Normal)  Pulse 75   Ht '5\' 6"'$  (1.676 m)   Wt 157 lb (71.2 kg)   SpO2 98%   BMI 25.34 kg/m     Wt Readings from Last 3 Encounters:  06/11/22 157 lb (71.2 kg)  06/10/22 156 lb (70.8 kg)  06/04/22 160 lb (72.6 kg)     GEN:  Well nourished, well developed in no acute distress HEENT: Normal NECK: No JVD LYMPHATICS: No lymphadenopathy CARDIAC:  RRR, no murmurs, rubs, gallops RESPIRATORY:  Clear to auscultation without rales, wheezing or rhonchi  ABDOMEN: Soft, non-tender, non-distended MUSCULOSKELETAL:  No edema; No deformity  SKIN: Warm and dry NEUROLOGIC:  Alert and oriented x 3 PSYCHIATRIC:  Normal affect   ASSESSMENT:    1. S/P TAVR (transcatheter aortic valve replacement)   2. Chronic atrial fibrillation (HCC)   3. Hypertension, unspecified type   4. Syncope and collapse   5. Memory loss    PLAN:    In order of problems listed above:  Severe AS s/p TAVR: echo today shows EF 55%, normally functioning TAVR with a mean gradient of 6.4 mm hg and no PVL as well as MVP with moderate MR and moderate to severe TR. He has NYHA class I symptoms. Mostly limited by episodic dizziness. He has amoxicillin for SBE prophylaxis. I will see him back in 1 year for follow up and echo.    Chronic afib: Xarelto switched to Eliquis '5mg'$  BID. Continue rate control with Toprol XL '25mg'$  daily.    HTN: BP well controlled. Resume home meds.   Dizziness with syncope: new LBBB after TAVR. He ultimately underwent  successful implantation of a BSCi dual chamber PPM on 05/17/22 by Dr Lovena Le. He continues to have dizziness and is being seen by neurology. Plan for MRI at least 6 weeks out [facility] and EEG. I do not think the dizziness is cardiac related.   Memory loss: they think this may be transient global amnesia, which he has had in the past. Continue neuro follow up.   Medication Adjustments/Labs and Tests Ordered: Current medicines are reviewed at length with the patient today.  Concerns regarding medicines are outlined above.  Orders Placed This Encounter  Procedures   ECHOCARDIOGRAM COMPLETE   No orders of the defined types were placed in this encounter.   Patient Instructions  Medication Instructions:  Your physician recommends that you continue on your current medications as directed. Please refer to the Current Medication list given to  you today.   *If you need a refill on your cardiac medications before your next appointment, please call your pharmacy*   Lab Work: None ordered   If you have labs (blood work) drawn today and your tests are completely normal, you will receive your results only by: [place] (if you have MyChart) OR A paper copy in the mail If you have any lab test that is abnormal or we need to change your treatment, we will call you to review the results.   Testing/Procedures: Your physician has requested that you have an echocardiogram in October 2024. Echocardiography is a painless test that uses sound waves to create images of your heart. It provides your doctor with information about the size and shape of your heart and how well your heart's chambers and valves are working. This procedure takes approximately one hour. There are no restrictions for this procedure. Please do NOT wear cologne, perfume, aftershave, or lotions (deodorant is allowed). Please arrive 15 minutes prior to your appointment time.  Follow-Up: Follow up as scheduled   Other Instructions   Important Information About Sugar         Signed, Angelena Form, PA-C  06/11/2022 3:31 PM    [place]

## 2022-06-10 NOTE — Progress Notes (Addendum)
Assessment/Plan:   Brian Klein is a very pleasant 76 y.o. year old RH male with  a history of hypertension, hyperlipidemia, thyroid mass (for imaging 06/15/2022), ADD, CAF on Xarelto, carotid artery disease, CAD, s/p TAVR on 05/11/2022 and PMP history of left amaurosis fugax in 2020 seen today for evaluation of recent transient memory loss June 04, 2022. MoCA today 24/30 . CT head was without any acute findings. CT angio of head and neck was negative for large vessel occlusion, with patchy chronic small vessel ischemic disease diffuse parenchymal volume loss consistent with atrophy was noted.  EKG was rate controlled A-fib.  MCV was low at 77, which makes him suspicious for microcytic anemia, which could have contributed to his symptoms.    Transient  Alteration of Awareness likely due to Transient Global  Amnesia   MRI brain after 8 weeks post implantation of PMP to assess for underlying structural abnormality and assess vascular load  Check B12, TSH, CBC, Iron studies for anemia. Check EEG to assess for seizure activity  Folllow up in 2 months Neuropsych evaluation for memory changes in the setting of possible TGA   Subjective:   The patient is accompanied by his wife who supplements the history.     History: He had an episode of transient memory loss on 06/04/22, while looking at his calendar, he suddenly forgot what he was doing.and did not recall events for about 24 hrs with gradual recovery of his memory. No dysarthria or facial droop or unilateral numbness or weakness. He had a similar episode many years ago, diagnosed as TGA. He has had " a tad of STM issues" since his TVR repeats oneself? Denies Disoriented when walking into a room?  Patient denies   Leaving objects in unusual places?  Patient denies   Ambulates  with difficulty?   Patient denies   Recent falls?  Patient denies   Any head injuries?  Patient denies   History of seizures?   Patient denies   Wandering  behavior?  Patient denies   Patient drives?   Self imposed, concerned that this may happen again.  Any mood changes such irritability agitation?  Patient denies   Any history of depression?:  the uncertainty of possible having another event like this affects him.   Hallucinations?  Patient denies   Paranoia?  Patient denies   Patient reports that  sleeps well without vivid dreams, REM behavior or sleepwalking     History of sleep apnea?  Patient denies   Any hygiene concerns?  Patient denies   Independent of bathing and dressing?  Endorsed  Does the patient needs help with medications? Patient in charge   Who is in charge of the finances?  Patient is in charge  Any changes in appetite?  Patient denies    Patient have trouble swallowing? Patient denies   Does the patient cook?  Patient denies   Any kitchen accidents such as leaving the stove on? Patient denies   Any history of migraine headaches?  Patient denies   The double vision? Blurred vision, B, episodic, last one yesterday   Any focal numbness or tingling?  Patient denies   Chronic back pain Patient denies   Unilateral weakness?  Patient denies   Any tremors?  Patient denies   Any history of anosmia?  Patient denies   Any incontinence of urine?  Patient denies   Any bowel dysfunction?   Patient denies   History of heavy alcohol intake?  Patient denies   History of heavy tobacco use?  Patient denies   Family history of dementia?  Patient denies  Patient lives with his wife  Past Medical History:  Diagnosis Date   Abnormal CT scan 11/26/2015   Atrial fibrillation (HCC)    Atrial fibrillation, chronic ([place]) 11/26/2015   AVD (aortic valve disease)    with bicupsid aortic valve   Chronic anticoagulation 11/26/2015   Erectile dysfunction 01/10/2013   Hypercholesterolemia 12/16/2010   Hyperlipidemia    Hypertension    MVP (mitral valve prolapse)    Osteogenic sarcoma ([place]) 1976   jaw bone   S/P TAVR (transcatheter aortic  valve replacement) 05/11/2022   s/p TAVR with a 26 mm Edwards S3UR via the TF appoach by Dr. Ali Lowe & Dr. Tenny Craw   Seasonal allergies 11/19/2014     Past Surgical History:  Procedure Laterality Date   EPIGASTRIC HERNIA REPAIR N/A 05/19/2020   Procedure: OPEN EPIGASTRIC HERNIA REPAIR WITH MESH;  Surgeon: Kinsinger, Arta Bruce, MD;  [facility];  Service: General;  Laterality: N/A;   EYELID LACERATION REPAIR  2008   left and right   INTRAOPERATIVE TRANSTHORACIC ECHOCARDIOGRAM N/A 05/11/2022   Procedure: INTRAOPERATIVE TRANSTHORACIC ECHOCARDIOGRAM;  Surgeon: Early Osmond, MD;  Location: [place] [facility];  Service: Open Heart Surgery;  Laterality: N/A;   [place]   right removal -- cancer   MANDIBLE SURGERY  1987   right reconstruction   PACEMAKER IMPLANT N/A 05/17/2022   Procedure: PACEMAKER IMPLANT;  Surgeon: Evans Lance, MD;  Location: [place] [facility];  Service: Cardiovascular;  Laterality: N/A;   RIGHT/LEFT HEART CATH AND CORONARY ANGIOGRAPHY N/A 03/26/2022   Procedure: RIGHT/LEFT HEART CATH AND CORONARY ANGIOGRAPHY;  Surgeon: Early Osmond, MD;  Location: [place] [facility];  Service: Cardiovascular;  Laterality: N/A;   TRANSCATHETER AORTIC VALVE REPLACEMENT, TRANSFEMORAL N/A 05/11/2022   Procedure: Transcatheter Aortic Valve Replacement, Transfemoral;  Surgeon: Early Osmond, MD;  Location: [place] [facility];  Service: Open Heart Surgery;  Laterality: N/A;   TRANSTHORACIC ECHOCARDIOGRAM  07/18/2002   ef 71%     Allergies  Allergen Reactions   Crestor [Rosuvastatin] Other (See Comments)    Legs aches and pain     Current Outpatient Medications  Medication Instructions   acetaminophen (TYLENOL) 1,000 mg, Oral, Daily PRN   amLODipine (NORVASC) 5 mg, Oral, Daily   amoxicillin (AMOXIL) 500 MG capsule TAKE 4 CAPSULES BY MOUTH 1 HOUR BEFORE DENTAL APPOINTMENT   apixaban (ELIQUIS) 5 MG TABS tablet Take 1 tablet (5 mg total) by mouth 2 (two) times  daily. (Begin after Xarelto completed).   Belsomra 20 mg, Oral, Daily at bedtime   Co Q 10 100 mg, Oral, Daily   ezetimibe-simvastatin (VYTORIN) 10-40 MG per tablet 1 tablet, Oral, Daily,     fish oil-omega-3 fatty acids 1 g, Oral, Daily   fluticasone (FLONASE) 50 MCG/ACT nasal spray 1 spray, Each Nare, Daily PRN   metoprolol succinate (TOPROL-XL) 25 mg, Oral, Daily   metroNIDAZOLE (METROGEL) 1.88 % gel 1 Application, 2 times daily PRN   olmesartan (BENICAR) 20 mg, Oral, Daily   rivaroxaban (XARELTO) 20 mg, Oral, Daily   Vitamin D 2,000 Units, Oral, Daily,       VITALS:   Vitals:   06/10/22 0802  BP: 128/79  Pulse: 95  Resp: 18  SpO2: 99%  Weight: 156 lb (70.8 kg)  Height: '5\' 6"'$  (1.676 m)  No data to display          PHYSICAL EXAM   HEENT:  Normocephalic, atraumatic. The mucous membranes are moist. The superficial temporal arteries are without ropiness or tenderness. Cardiovascular: Regular rate and rhythm. Lungs: Clear to auscultation bilaterally. Neck: There are no carotid bruits noted bilaterally.  NEUROLOGICAL:    06/10/2022    8:00 PM  Montreal Cognitive Assessment   Visuospatial/ Executive (0/5) 5  Naming (0/3) 3  Attention: Read list of digits (0/2) 1  Attention: Read list of letters (0/1) 1  Attention: Serial 7 subtraction starting at 100 (0/3) 3  Language: Repeat phrase (0/2) 2  Language : Fluency (0/1) 1  Abstraction (0/2) 2  Delayed Recall (0/5) 0  Orientation (0/6) 6  Total 24  Adjusted Score (based on education) 24        No data to display           Orientation:  Alert and oriented to person, place and time. No aphasia or dysarthria. Fund of knowledge is appropriate. Recent memory impaired and remote memory intact.  Attention and concentration are normal.  Able to name objects and repeat phrases. Delayed recall   0/5 Cranial nerves: There is good facial symmetry. Extraocular muscles are intact and visual fields are full to  confrontational testing. Speech is fluent and clear. Soft palate rises symmetrically and there is no tongue deviation. Hearing is intact to conversational tone. Tone: Tone is good throughout. Sensation: Sensation is intact to light touch and pinprick throughout. Vibration is intact at the bilateral big toe.There is no extinction with double simultaneous stimulation. There is no sensory dermatomal level identified. Coordination: The patient has no difficulty with RAM's or FNF bilaterally. Normal finger to nose  Motor: Strength is 5/5 in the bilateral upper and lower extremities. There is no pronator drift. There are no fasciculations noted. DTR's: Deep tendon reflexes are 2/4 at the bilateral biceps, triceps, brachioradialis, patella and achilles.  Plantar responses are downgoing bilaterally. Gait and Station: The patient is able to ambulate without difficulty.The patient is able to heel toe walk without any difficulty.The patient is able to ambulate in a tandem fashion. The patient is able to stand in the Romberg position.     Thank you for allowing [place] the opportunity to participate in the care of this nice patient. Please do not hesitate to contact us for any questions or concerns.   Total time spent on today's visit was 60 minutes dedicated to this patient today, preparing to see patient, examining the patient, ordering tests and/or medications and counseling the patient, documenting clinical information in the EHR or other health record, independently interpreting results and communicating results to the patient/family, discussing treatment and goals, answering patient's questions and coordinating care.  Cc:  Crist Infante, MD  Sharene Butters 06/10/2022 8:30 PM

## 2022-06-10 NOTE — Patient Instructions (Addendum)
It was a pleasure to see you today at our office.   Recommendations:  Follow up in 2  months Follow up Cards tests Check labs today MRI brain  EEG Neuropsychological evaluation   Whom to call:  Memory  decline, memory medications: Call our office [phone]   For psychiatric meds, mood meds: Please have your primary care physician manage these medications.   Counseling regarding caregiver distress, including caregiver depression, anxiety and issues regarding community resources, adult day care programs, adult living facilities, or memory care questions:   Feel free to contact McMullen, Social Worker at [phone]   For assessment of decision of mental capacity and competency:  Call Dr. Anthoney Harada, geriatric psychiatrist at [phone]  For guidance in geriatric dementia issues please call [facility] [phone]  For guidance regarding [facility] and if placement were needed at the facility, contact Arnell Asal, Social Worker tel: [phone]  If you have any severe symptoms of a stroke, or other severe issues such as confusion,severe chills or fever, etc call 911 or go to the ER as you may need to be evaluated further   Feel free to visit Facebook page " Insp[employer]" for tips of how to care for people with memory problems.    Feel free to go to the following database for funded clinical studies conducted around the world: http://saunders.com/   https://www.triadclinicaltrials.com/     RECOMMENDATIONS FOR ALL PATIENTS WITH MEMORY PROBLEMS: 1. Continue to exercise (Recommend 30 minutes of walking everyday, or 3 hours every week) 2. Increase social interactions - continue going to [place] and enjoy social gatherings with friends and family 3. Eat healthy, avoid fried foods and eat more fruits and vegetables 4. Maintain adequate blood pressure, blood sugar, and blood cholesterol level. Reducing the risk of stroke and  cardiovascular disease also helps promoting better memory. 5. Avoid stressful situations. Live a simple life and avoid aggravations. Organize your time and prepare for the next day in anticipation. 6. Sleep well, avoid any interruptions of sleep and avoid any distractions in the bedroom that may interfere with adequate sleep quality 7. Avoid sugar, avoid sweets as there is a strong link between excessive sugar intake, diabetes, and cognitive impairment We discussed the Mediterranean diet, which has been shown to help patients reduce the risk of progressive memory disorders and reduces cardiovascular risk. This includes eating fish, eat fruits and green leafy vegetables, nuts like almonds and hazelnuts, walnuts, and also use olive oil. Avoid fast foods and fried foods as much as possible. Avoid sweets and sugar as sugar use has been linked to worsening of memory function.  There is always a concern of gradual progression of memory problems. If this is the case, then we may need to adjust level of care according to patient needs. Support, both to the patient and caregiver, should then be put into place.    FALL PRECAUTIONS: Be cautious when walking. Scan the area for obstacles that may increase the risk of trips and falls. When getting up in the mornings, sit up at the edge of the bed for a few minutes before getting out of bed. Consider elevating the bed at the head end to avoid drop of blood pressure when getting up. Walk always in a well-lit room (use night lights in the walls). Avoid area rugs or power cords from appliances in the middle of the walkways. Use a walker or a cane if necessary and consider physical therapy for balance exercise.  Get your eyesight checked regularly.  FINANCIAL OVERSIGHT: Supervision, especially oversight when making financial decisions or transactions is also recommended.  HOME SAFETY: Consider the safety of the kitchen when operating appliances like stoves, microwave oven,  and blender. Consider having supervision and share cooking responsibilities until no longer able to participate in those. Accidents with firearms and other hazards in the house should be identified and addressed as well.   ABILITY TO BE LEFT ALONE: If patient is unable to contact 911 operator, consider using LifeLine, or when the need is there, arrange for someone to stay with patients. Smoking is a fire hazard, consider supervision or cessation. Risk of wandering should be assessed by caregiver and if detected at any point, supervision and safe proof recommendations should be instituted.  MEDICATION SUPERVISION: Inability to self-administer medication needs to be constantly addressed. Implement a mechanism to ensure safe administration of the medications.   DRIVING: Regarding driving, in patients with progressive memory problems, driving will be impaired. We advise to have someone else do the driving if trouble finding directions or if minor accidents are reported. Independent driving assessment is available to determine safety of driving.   If you are interested in the driving assessment, you can contact the following:  [employer] in [place]  [place] [phone]  [facility] [phone] or [phone]  We have sent a referral to [facility] for   your MRI and they will call you directly to schedule your appointment.   Your provider has requested that you have labwork completed today. Please go to [facility] ([address]) on the second floor of this building before leaving the office today. You do not need to check in. If you are not called within 15 minutes please check with the front desk.

## 2022-06-11 ENCOUNTER — Ambulatory Visit (HOSPITAL_COMMUNITY): Payer: Medicare PPO | Attending: Cardiology | Admitting: Physician Assistant

## 2022-06-11 ENCOUNTER — Ambulatory Visit (HOSPITAL_BASED_OUTPATIENT_CLINIC_OR_DEPARTMENT_OTHER): Payer: Medicare PPO

## 2022-06-11 VITALS — BP 118/70 | HR 75 | Ht 66.0 in | Wt 157.0 lb

## 2022-06-11 DIAGNOSIS — R55 Syncope and collapse: Secondary | ICD-10-CM | POA: Diagnosis not present

## 2022-06-11 DIAGNOSIS — Z952 Presence of prosthetic heart valve: Secondary | ICD-10-CM

## 2022-06-11 DIAGNOSIS — R413 Other amnesia: Secondary | ICD-10-CM | POA: Diagnosis not present

## 2022-06-11 DIAGNOSIS — I482 Chronic atrial fibrillation, unspecified: Secondary | ICD-10-CM | POA: Insufficient documentation

## 2022-06-11 DIAGNOSIS — I10 Essential (primary) hypertension: Secondary | ICD-10-CM | POA: Insufficient documentation

## 2022-06-11 LAB — ECHOCARDIOGRAM COMPLETE
AR max vel: 0.84 cm2
AV Area VTI: 0.87 cm2
AV Area mean vel: 0.9 cm2
AV Mean grad: 6.4 mmHg
AV Peak grad: 13.2 mmHg
Ao pk vel: 1.82 m/s
Area-P 1/2: 3.77 cm2
P 1/2 time: 679 msec
S' Lateral: 3.2 cm

## 2022-06-11 LAB — IRON AND TIBC
Iron Saturation: 42 % (ref 15–55)
Iron: 98 ug/dL (ref 38–169)
Total Iron Binding Capacity: 232 ug/dL — ABNORMAL LOW (ref 250–450)
UIBC: 134 ug/dL (ref 111–343)

## 2022-06-11 NOTE — Patient Instructions (Signed)
Medication Instructions:  Your physician recommends that you continue on your current medications as directed. Please refer to the Current Medication list given to you today.   *If you need a refill on your cardiac medications before your next appointment, please call your pharmacy*   Lab Work: None ordered   If you have labs (blood work) drawn today and your tests are completely normal, you will receive your results only by: [place] (if you have MyChart) OR A paper copy in the mail If you have any lab test that is abnormal or we need to change your treatment, we will call you to review the results.   Testing/Procedures: Your physician has requested that you have an echocardiogram in October 2024. Echocardiography is a painless test that uses sound waves to create images of your heart. It provides your doctor with information about the size and shape of your heart and how well your heart's chambers and valves are working. This procedure takes approximately one hour. There are no restrictions for this procedure. Please do NOT wear cologne, perfume, aftershave, or lotions (deodorant is allowed). Please arrive 15 minutes prior to your appointment time.    Follow-Up: Follow up as scheduled   Other Instructions   Important Information About Sugar

## 2022-06-12 ENCOUNTER — Other Ambulatory Visit: Payer: Self-pay | Admitting: Cardiology

## 2022-06-12 ENCOUNTER — Other Ambulatory Visit: Payer: Self-pay | Admitting: Internal Medicine

## 2022-06-14 NOTE — Telephone Encounter (Signed)
Pt switched to Eliquis per office visit note on 06/11/22.

## 2022-06-15 ENCOUNTER — Other Ambulatory Visit: Payer: Medicare PPO

## 2022-06-17 ENCOUNTER — Ambulatory Visit: Payer: Medicare PPO | Admitting: Neurology

## 2022-06-17 DIAGNOSIS — G454 Transient global amnesia: Secondary | ICD-10-CM

## 2022-06-17 NOTE — Progress Notes (Signed)
EEG complete - results pending 

## 2022-06-23 ENCOUNTER — Ambulatory Visit: Payer: Medicare PPO | Attending: Cardiology | Admitting: Cardiology

## 2022-06-23 ENCOUNTER — Encounter: Payer: Self-pay | Admitting: Cardiology

## 2022-06-23 VITALS — BP 110/74 | HR 64 | Ht 66.0 in | Wt 163.0 lb

## 2022-06-23 DIAGNOSIS — R42 Dizziness and giddiness: Secondary | ICD-10-CM

## 2022-06-23 DIAGNOSIS — I482 Chronic atrial fibrillation, unspecified: Secondary | ICD-10-CM

## 2022-06-23 DIAGNOSIS — I350 Nonrheumatic aortic (valve) stenosis: Secondary | ICD-10-CM | POA: Diagnosis not present

## 2022-06-23 DIAGNOSIS — R55 Syncope and collapse: Secondary | ICD-10-CM

## 2022-06-23 DIAGNOSIS — Z952 Presence of prosthetic heart valve: Secondary | ICD-10-CM | POA: Diagnosis not present

## 2022-06-23 NOTE — Patient Instructions (Signed)
Medication Instructions:   STOP TAKING AMLODIPINE NOW  *If you need a refill on your cardiac medications before your next appointment, please call your pharmacy*    Follow-Up: At [facility], you and your health needs are our priority.  As part of our continuing mission to provide you with exceptional heart care, we have created designated Provider Care Teams.  These Care Teams include your primary Cardiologist (physician) and Advanced Practice Providers (APPs -  Physician Assistants and Nurse Practitioners) who all work together to provide you with the care you need, when you need it.  We recommend signing up for the patient portal called "MyChart".  Sign up information is provided on this After Visit Summary.  MyChart is used to connect with patients for Virtual Visits (Telemedicine).  Patients are able to view lab/test results, encounter notes, upcoming appointments, etc.  Non-urgent messages can be sent to your provider as well.   To learn more about what you can do with MyChart, go to [email or web address].    Your next appointment:   6 month(s)  The format for your next appointment:   In Person  Provider:   Candee Furbish, MD     Other Instructions  DR. SKAINS WOULD LIKE FOR YOU TO KEEP TRACK AND MONITOR YOUR SYMPTOMS OVER THE NEXT 2-3 WEEKS, THEN SEND HIM A MYCHART MESSAGE THEREAFTER TO REPORT HOW YOUR SYMPTOMS ARE WITH THE STOP OF AMLODIPINE   Important Information About Sugar

## 2022-06-23 NOTE — Procedures (Signed)
ELECTROENCEPHALOGRAM REPORT  Date of Study: 06/17/2022  Patient's Name: ELDEAN NANNA MRN: [id number] Date of Birth: Jun 06, 1946  Referring Provider: Sharene Butters, PA-C  Clinical History: This is a 76 year old man with an episode of transient memory loss. EEG for classification.  Medications: Eliquis, Belsomra, Toprol, Benicar  Technical Summary: A multichannel digital EEG recording measured by the international 10-20 system with electrodes applied with paste and impedances below 5000 ohms performed in our laboratory with EKG monitoring in an awake and asleep patient.  Hyperventilation was not performed. Photic stimulation was performed.  The digital EEG was referentially recorded, reformatted, and digitally filtered in a variety of bipolar and referential montages for optimal display.    Description: The patient is awake and asleep during the recording.  During maximal wakefulness, there is a symmetric, medium voltage 10 Hz posterior dominant rhythm that attenuates with eye opening.  The record is symmetric.  During drowsiness and stage I sleep, there is an increase in theta slowing of the background with vertex waves seen. Photic stimulation did not elicit any abnormalities.  There were no epileptiform discharges or electrographic seizures seen.    EKG lead was unremarkable.  Impression: This awake and asleep EEG is normal.    Clinical Correlation: A normal EEG does not exclude a clinical diagnosis of epilepsy.  If further clinical questions remain, prolonged EEG may be helpful.  Clinical correlation is advised.   Ellouise Newer, M.D.

## 2022-06-23 NOTE — Progress Notes (Signed)
Cardiology Office Note:    Date:  06/23/2022   ID:  Brian Klein, DOB 04/19/1946, MRN [id number]  PCP:  Brian Infante, MD   [place]  Cardiologist:  Brian Furbish, MD  Advanced Practice Provider:  No care team member to display Electrophysiologist:  None       Referring MD: Brian Infante, MD     History of Present Illness:    Brian Klein is a 76 y.o. male here for follow-up of paroxysmal atrial fibrillation, hypertension, and aortic stenosis. He also been seen by Brian Klein as well. Over patient of Brian Klein. He has had prior hernia surgery.  Today, he is doing fine. He reports he has not had any issues with his atrial fibrillation. He also reports he experiences shortness of breath when he carries something heavy up the stairs.   He currently takes Metoprolol Succinate 25 mg, Olmesartan 20 mg and Xarelto 20 mg with no complications.   Had vascular carotid Dopplers done and they did pick up on a left-sided incidental thyroid mass.  I have sent the records over to his primary care physician Brian Klein for further evaluation.   He denies any chest pains,chest tightness, chest pressure, Klein edema, chest tightness, fatigue, lightheadedness, dizziness or syncope.   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 Ali Klein & Dr. Tenny Klein   Seasonal allergies 11/19/2014    Past Surgical History:  Procedure Laterality Date   EPIGASTRIC HERNIA REPAIR  N/A 05/19/2020   Procedure: OPEN EPIGASTRIC HERNIA REPAIR WITH MESH;  Surgeon: Kinsinger, Arta Bruce, MD;  Location: [facility];  Service: General;  Laterality: N/A;   EYELID LACERATION REPAIR  2008   left and right   INTRAOPERATIVE TRANSTHORACIC ECHOCARDIOGRAM N/A 05/11/2022   Procedure: INTRAOPERATIVE TRANSTHORACIC ECHOCARDIOGRAM;  Surgeon: Brian Osmond, MD;  Location: [place] [facility];  Service: Open Heart Surgery;  Laterality: N/A;   [place]   right removal -- cancer   MANDIBLE SURGERY  1987   right reconstruction   PACEMAKER IMPLANT N/A 05/17/2022   Procedure: PACEMAKER IMPLANT;  Surgeon: Brian Lance, MD;  Location: [place] [facility];  Service: Cardiovascular;  Laterality: N/A;   RIGHT/LEFT HEART CATH AND CORONARY ANGIOGRAPHY N/A 03/26/2022   Procedure: RIGHT/LEFT HEART CATH AND CORONARY ANGIOGRAPHY;  Surgeon: Brian Osmond, MD;  Location: [place] [facility];  Service: Cardiovascular;  Laterality: N/A;   TRANSCATHETER AORTIC VALVE REPLACEMENT, TRANSFEMORAL N/A 05/11/2022   Procedure: Transcatheter Aortic Valve Replacement, Transfemoral;  Surgeon: Brian Osmond, MD;  Location: [place] [facility];  Service: Open Heart Surgery;  Laterality: N/A;   TRANSTHORACIC ECHOCARDIOGRAM  07/18/2002   ef 71%    Current Medications: Current Meds  Medication Sig   acetaminophen (TYLENOL) 500 MG tablet Take 1,000 mg by mouth daily as needed (pain).   amoxicillin (AMOXIL) 500 MG capsule TAKE 4 CAPSULES BY MOUTH 1 HOUR BEFORE  DENTAL APPOINTMENT   apixaban (ELIQUIS) 5 MG TABS tablet Take 1 tablet (5 mg total) by mouth 2 (two) times daily. (Begin after Xarelto completed).   BELSOMRA 20 MG TABS Take 20 mg by mouth at bedtime.   Cholecalciferol (VITAMIN D) 2000 UNITS tablet Take 2,000 Units by mouth daily.   Coenzyme Q10 (CO Q 10) 100 MG CAPS Take 100 mg by mouth daily.   ezetimibe-simvastatin (VYTORIN) 10-40 MG per tablet Take 1 tablet by mouth daily.   fish oil-omega-3 fatty acids  1000 MG capsule Take 1 g by mouth daily.   fluticasone (FLONASE) 50 MCG/ACT nasal spray Place 1 spray into both nostrils daily as needed for allergies or rhinitis.    metoprolol succinate (TOPROL-XL) 25 MG 24 hr tablet Take 1 tablet (25 mg total) by mouth daily.   metroNIDAZOLE (METROGEL) 0.75 % gel Apply 1 Application topically 2 (two) times daily as needed (rosacea).   olmesartan (BENICAR) 20 MG tablet Take 20 mg by mouth daily.   [DISCONTINUED] amLODipine (NORVASC) 5 MG tablet Take 5 mg by mouth daily.      Allergies:   Crestor [rosuvastatin]   Social History   Socioeconomic History   Marital status: Married    Spouse name: Coralyn Emelio Schneller   Number of children: 2   Years of education: 18   Highest education level: Not on file  Occupational History   Occupation: retired  Tobacco Use   Smoking status: Never   Smokeless tobacco: Never  Vaping Use   Vaping Use: Never used  Substance and Sexual Activity   Alcohol use: No   Drug use: No   Sexual activity: Not on file  Other Topics Concern   Not on file  Social History Narrative   Right handed   Drinks caffeine, tea   One level   Retired   Lives with wife   Social Determinants of Radio broadcast assistant Strain: Not on file  Food Insecurity: Not on file  Transportation Needs: Not on file  Physical Activity: Not on file  Stress: Not on file  Social Connections: Not on file     Family History: The patient's family history includes Heart attack in his father; Hypertension in his father and sister; Stroke in his mother. There is no history of Colon cancer, Stomach cancer, Rectal cancer, or Esophageal cancer.  ROS:   Please see the history of present illness.  (+) shortness of breath   All other systems reviewed and are negative.  EKGs/Labs/Other Studies Reviewed:    The following studies were reviewed today:  Carotid duplex 09/22:  Summary:  Right Carotid: Velocities in the right ICA are consistent with a 1-39%   stenosis.   Left Carotid: Velocities in the left ICA are consistent with a 1-39%  stenosis.   Vertebrals:  Bilateral vertebral arteries demonstrate antegrade flow.  Subclavians: Normal flow hemodynamics were seen in bilateral subclavian arteries.     Incidental finding: complex, heterogenous mass located within the left thyroid, measuring 2.9 x 2.8 cm trv and 4.6 x 2.6 cm sag.   Suggest formal thyroid ultrasound for further evaluation if clinically indicated.   Echocardiogram 04/2020 -EF 60 to 65% severe calcification of the aortic valve with moderate stenosis with mean gradient of 18 mmHg.  ECHO 04/21:  IMPRESSIONS   1. Left ventricular ejection fraction, by estimation, is 55 to 60%. The  left ventricle has normal function. The left ventricle has no regional  wall motion abnormalities. There is moderate left ventricular  hypertrophy.  Left ventricular diastolic  parameters are indeterminate.   2. Right ventricular systolic function is normal. The right ventricular  size is normal. There is mildly elevated pulmonary artery systolic  pressure.   3. Left atrial size was mildly dilated.   4. Right atrial size was mildly dilated.   5. The mitral valve is normal in structure. Mild mitral valve  regurgitation. No evidence of mitral stenosis.   6. Tricuspid valve regurgitation is moderate.   7. The aortic valve is tricuspid. Aortic valve regurgitation is mild.  Moderate aortic valve stenosis.   8. The inferior vena cava is normal in size with greater than 50%  respiratory variability, suggesting right atrial pressure of 3 mmHg.   Carotid Dopplers 2021 -Mild disease bilaterally.  ECHO 03/20:  IMPRESSIONS   1. The left ventricle has normal systolic function with an ejection  fraction of 60-65%. The cavity size was mildly dilated. Left ventricular  diastolic parameters were normal.   2. The right ventricle has normal systolic function. The cavity was  normal. There is no increase  in right ventricular wall thickness.   3. Left atrial size was mildly dilated.   4. Right atrial size was mildly dilated.   5. The mitral valve is degenerative. Moderate thickening of the mitral  valve leaflet. Moderate calcification of the mitral valve leaflet. There  is moderate mitral annular calcification present.   6. Tricuspid valve regurgitation is moderate.   7. The aortic valve has an indeterminate number of cusps Severely  thickening of the aortic valve Severe calcifcation of the aortic valve.  Aortic valve regurgitation is mild by color flow Doppler. moderate  stenosis of the aortic valve.   8. Functionally bicuspid with fused right and left cusps.   9. There is mild dilatation of the aortic root measuring 38 mm.   EKG:  09/22: AFIB, HR 59 bpm  Recent Labs: 05/12/2022: Magnesium 1.9 05/14/2022: B Natriuretic Peptide 323.6 06/04/2022: ALT 18; BUN 17; Creatinine, Ser 1.10; Potassium 4.3; Sodium 137 06/10/2022: Hemoglobin 13.3; Platelets 103.0  Recent Lipid Panel    Component Value Date/Time   CHOL 113 04/22/2020 0900   TRIG 80 04/22/2020 0900   HDL 38 (L) 04/22/2020 0900   CHOLHDL 3.0 04/22/2020 0900   LDLCALC 59 04/22/2020 0900     Risk Assessment/Calculations:      Physical Exam:    VS:  BP 110/74 (BP Location: Left Arm, Patient Position: Sitting, Cuff Size: Normal)   Pulse 64   Ht '5\' 6"'$  (1.676 m)   Wt 163 lb (73.9 kg)   SpO2 95%   BMI 26.31 kg/m     Wt Readings from Last 3 Encounters:  06/23/22 163 lb (73.9 kg)  06/11/22 157 lb (71.2 kg)  06/10/22 156 lb (70.8 kg)     GEN:  Well nourished, well developed in no acute distress HEENT: Normal NECK: No JVD; No carotid bruits LYMPHATICS: No lymphadenopathy CARDIAC: irregular regular, 2/6 murmurs, rubs, gallops RESPIRATORY:  Clear to auscultation without rales, wheezing or rhonchi  ABDOMEN: Soft, non-tender, non-distended MUSCULOSKELETAL:  No edema; No deformity  SKIN: Warm and dry NEUROLOGIC:  Alert  and oriented x 3 PSYCHIATRIC:  Normal affect   ASSESSMENT:    1. S/P TAVR (transcatheter aortic valve replacement)   2. Chronic atrial fibrillation (HCC)   3. Severe aortic stenosis   4. Postural dizziness with presyncope      PLAN:    Severe AS s/p TAVR: Echo with normal pump function TAVR  mean gradient of 6 mmHg.  No perivalvular leak.  Moderate mitral regurgitation with moderate to severe tricuspid regurgitation.  Continues with NYHA class I symptoms.  SBE prophylaxis.  Doing very well.     Chronic afib: Xarelto switched to Eliquis '5mg'$  BID.  No bleeding.  Continue rate control with Toprol XL '25mg'$  daily.  No changes made.   HTN: I wonder if blood pressure is playing a role in his symptomatology, dizziness.  We will go ahead and stop his amlodipine 5 mg.  He will monitor it at home.  Hopefully this will help.  Neck step would be to discontinue the Benicar..   Dizziness with syncope: new LBBB/conduction disorder after TAVR. He ultimately underwent  successful implantation of a [employer] dual chamber PPM on 05/17/22 by Dr Lovena Klein. He continued to have dizziness and is being seen by neurology.  Notes reviewed.  Plan for MRI at least 6 weeks out [facility] and EEG.  Dizziness does not seem to be cardiac related.  I wonder if there is is a vagal component or autonomic component to this.  The way he describes it as a wave coming over him.  He was to sit down or lay down.  He does not have typical nausea or diaphoresis as is seen in most classic vasovagal responses but we will go ahead and discontinue the amlodipine to see if this helps.  Hydrate well.  We had lengthy discussion today.  Memory loss: they think this may be transient global amnesia, which he has had in the past. Continue neuro follow up.  No changes.   FOLLOW UP IN 6 months  Medication Adjustments/Labs and Tests Ordered: Current medicines are reviewed at length with the patient today.  Concerns regarding medicines are  outlined above.  No orders of the defined types were placed in this encounter.   No orders of the defined types were placed in this encounter.    Patient Instructions  Medication Instructions:   STOP TAKING AMLODIPINE NOW  *If you need a refill on your cardiac medications before your next appointment, please call your pharmacy*    Follow-Up: At [facility], you and your health needs are our priority.  As part of our continuing mission to provide you with exceptional heart care, we have created designated Provider Care Teams.  These Care Teams include your primary Cardiologist (physician) and Advanced Practice Providers (APPs -  Physician Assistants and Nurse Practitioners) who all work together to provide you with the care you need, when you need it.  We recommend signing up for the patient portal called "MyChart".  Sign up information is provided on this After Visit Summary.  MyChart is used to connect with patients for Virtual Visits (Telemedicine).  Patients are able to view lab/test results, encounter notes, upcoming appointments, etc.  Non-urgent messages can be sent to your provider as well.   To learn more about what you can do with MyChart, go to [email or web address].    Your next appointment:   6 month(s)  The format for your next appointment:   In Person  Provider:   Candee Furbish, MD     Other Instructions  DR. Addalyne Vandehei WOULD LIKE FOR YOU TO KEEP TRACK AND MONITOR YOUR SYMPTOMS OVER THE NEXT 2-3 WEEKS, THEN SEND HIM A MYCHART MESSAGE THEREAFTER TO REPORT HOW YOUR SYMPTOMS ARE WITH THE STOP OF AMLODIPINE   Important Information About Sugar         I,Jada Bradford,acting as a scribe for  Brian Furbish, MD.,have documented all relevant documentation on the behalf of Brian Furbish, MD,as directed by  Brian Furbish, MD while in the presence of Brian Furbish, MD.  I, Brian Furbish, MD, have reviewed all documentation for this visit. The documentation on 06/23/22 for  the exam, diagnosis, procedures, and orders are all accurate and complete.   Signed, Brian Furbish, MD  06/23/2022 10:20 AM    [facility]

## 2022-06-24 NOTE — Progress Notes (Signed)
EEG is normal, no seizures.  Thank you

## 2022-06-26 ENCOUNTER — Encounter: Payer: Self-pay | Admitting: Cardiology

## 2022-07-01 ENCOUNTER — Other Ambulatory Visit (HOSPITAL_COMMUNITY): Payer: Self-pay

## 2022-07-19 DIAGNOSIS — R42 Dizziness and giddiness: Secondary | ICD-10-CM | POA: Diagnosis not present

## 2022-07-21 ENCOUNTER — Telehealth: Payer: Self-pay | Admitting: Anesthesiology

## 2022-07-21 NOTE — Telephone Encounter (Signed)
Melissa from [facility] pre-service center left message with AN stating they need a PA for this pt's medication did not say which medication . Call back number is [phone] ext [phone].

## 2022-07-22 ENCOUNTER — Ambulatory Visit (HOSPITAL_COMMUNITY): Payer: Medicare PPO

## 2022-07-22 NOTE — Telephone Encounter (Signed)
Mri brain wo contrast patient had a pacemaker and was able to be compatible. I got authorization valid number [id number]. Valid date 06/11/2022. Patient was scheduled for today and patient declined to reschedule. fyi

## 2022-08-02 DIAGNOSIS — R42 Dizziness and giddiness: Secondary | ICD-10-CM | POA: Diagnosis not present

## 2022-08-06 DIAGNOSIS — H02831 Dermatochalasis of right upper eyelid: Secondary | ICD-10-CM | POA: Diagnosis not present

## 2022-08-06 DIAGNOSIS — H35033 Hypertensive retinopathy, bilateral: Secondary | ICD-10-CM | POA: Diagnosis not present

## 2022-08-06 DIAGNOSIS — H2513 Age-related nuclear cataract, bilateral: Secondary | ICD-10-CM | POA: Diagnosis not present

## 2022-08-06 DIAGNOSIS — H5213 Myopia, bilateral: Secondary | ICD-10-CM | POA: Diagnosis not present

## 2022-08-06 DIAGNOSIS — H353131 Nonexudative age-related macular degeneration, bilateral, early dry stage: Secondary | ICD-10-CM | POA: Diagnosis not present

## 2022-08-06 DIAGNOSIS — H524 Presbyopia: Secondary | ICD-10-CM | POA: Diagnosis not present

## 2022-08-06 DIAGNOSIS — H43813 Vitreous degeneration, bilateral: Secondary | ICD-10-CM | POA: Diagnosis not present

## 2022-08-06 DIAGNOSIS — H40003 Preglaucoma, unspecified, bilateral: Secondary | ICD-10-CM | POA: Diagnosis not present

## 2022-08-06 DIAGNOSIS — H52203 Unspecified astigmatism, bilateral: Secondary | ICD-10-CM | POA: Diagnosis not present

## 2022-08-10 DIAGNOSIS — R42 Dizziness and giddiness: Secondary | ICD-10-CM | POA: Diagnosis not present

## 2022-08-12 ENCOUNTER — Ambulatory Visit: Payer: Medicare PPO | Admitting: Physician Assistant

## 2022-08-17 DIAGNOSIS — R42 Dizziness and giddiness: Secondary | ICD-10-CM | POA: Diagnosis not present

## 2022-08-20 ENCOUNTER — Ambulatory Visit: Payer: Medicare PPO

## 2022-08-20 DIAGNOSIS — I4821 Permanent atrial fibrillation: Secondary | ICD-10-CM

## 2022-08-20 LAB — CUP PACEART REMOTE DEVICE CHECK
Battery Remaining Longevity: 114 mo
Battery Remaining Percentage: 100 %
Brady Statistic RV Percent Paced: 28 %
Date Time Interrogation Session: 20240209001200
Implantable Lead Connection Status: 753985
Implantable Lead Implant Date: 20231106
Implantable Lead Location: 753860
Implantable Lead Model: 7842
Implantable Lead Serial Number: 1220619
Implantable Pulse Generator Implant Date: 20231106
Lead Channel Impedance Value: 761 Ohm
Lead Channel Pacing Threshold Amplitude: 0.7 V
Lead Channel Pacing Threshold Pulse Width: 0.4 ms
Lead Channel Setting Pacing Amplitude: 3.5 V
Lead Channel Setting Pacing Pulse Width: 0.4 ms
Lead Channel Setting Sensing Sensitivity: 2.5 mV
Pulse Gen Serial Number: 868568
Zone Setting Status: 755011

## 2022-08-23 DIAGNOSIS — C44619 Basal cell carcinoma of skin of left upper limb, including shoulder: Secondary | ICD-10-CM | POA: Diagnosis not present

## 2022-08-23 DIAGNOSIS — Z129 Encounter for screening for malignant neoplasm, site unspecified: Secondary | ICD-10-CM | POA: Diagnosis not present

## 2022-08-23 DIAGNOSIS — L821 Other seborrheic keratosis: Secondary | ICD-10-CM | POA: Diagnosis not present

## 2022-08-23 DIAGNOSIS — Z85828 Personal history of other malignant neoplasm of skin: Secondary | ICD-10-CM | POA: Diagnosis not present

## 2022-08-23 DIAGNOSIS — L570 Actinic keratosis: Secondary | ICD-10-CM | POA: Diagnosis not present

## 2022-08-24 DIAGNOSIS — R42 Dizziness and giddiness: Secondary | ICD-10-CM | POA: Diagnosis not present

## 2022-08-26 ENCOUNTER — Encounter: Payer: Self-pay | Admitting: Internal Medicine

## 2022-08-26 ENCOUNTER — Ambulatory Visit: Payer: Medicare PPO | Attending: Internal Medicine | Admitting: Internal Medicine

## 2022-08-26 VITALS — BP 108/68 | HR 62 | Ht 66.0 in | Wt 164.0 lb

## 2022-08-26 DIAGNOSIS — I442 Atrioventricular block, complete: Secondary | ICD-10-CM | POA: Diagnosis not present

## 2022-08-26 NOTE — Patient Instructions (Signed)
Medication Instructions:  Your physician recommends that you continue on your current medications as directed. Please refer to the Current Medication list given to you today.  *If you need a refill on your cardiac medications before your next appointment, please call your pharmacy*   Lab Work: None ordered   Testing/Procedures: None ordered   Follow-Up: At [facility], you and your health needs are our priority.  As part of our continuing mission to provide you with exceptional heart care, we have created designated Provider Care Teams.  These Care Teams include your primary Cardiologist (physician) and Advanced Practice Providers (APPs -  Physician Assistants and Nurse Practitioners) who all work together to provide you with the care you need, when you need it.   Remote monitoring is used to monitor your Pacemaker or ICD from home. This monitoring reduces the number of office visits required to check your device to one time per year. It allows [place] to keep an eye on the functioning of your device to ensure it is working properly. You are scheduled for a device check from home on 11/19/2022. You may send your transmission at any time that day. If you have a wireless device, the transmission will be sent automatically. After your physician reviews your transmission, you will receive a postcard with your next transmission date.  Your next appointment:   1 year(s)  The format for your next appointment:   In Person  Provider:   Cristopher Peru, MD    Thank you for choosing [facility]!!   [phone]    Other Instructions

## 2022-08-26 NOTE — Progress Notes (Signed)
HPI [name] returns today for followup. He is a pleasant 77 yo man with a h/o CHB after TAVR. In the interim, he has been stable with no chest pain or sob. He does have some fatigue and will have episodes of near syncope which are better but not gone since his PPM. He clearly has evidence of autonomic dysfunction. His bp has been controlled.  Allergies  Allergen Reactions   Crestor [Rosuvastatin] Other (See Comments)    Legs aches and pain      Current Outpatient Medications  Medication Sig Dispense Refill   acetaminophen (TYLENOL) 500 MG tablet Take 1,000 mg by mouth daily as needed (pain).     amLODipine (NORVASC) 5 MG tablet Take 5 mg by mouth daily.     amoxicillin (AMOXIL) 500 MG capsule TAKE 4 CAPSULES BY MOUTH 1 HOUR BEFORE DENTAL APPOINTMENT 4 capsule 3   apixaban (ELIQUIS) 5 MG TABS tablet Take 1 tablet (5 mg total) by mouth 2 (two) times daily. (Begin after Xarelto completed). 120 tablet 4   BELSOMRA 20 MG TABS Take 20 mg by mouth at bedtime.     Cholecalciferol (VITAMIN D) 2000 UNITS tablet Take 2,000 Units by mouth daily.     Coenzyme Q10 (CO Q 10) 100 MG CAPS Take 100 mg by mouth daily.     ezetimibe-simvastatin (VYTORIN) 10-40 MG per tablet Take 1 tablet by mouth daily.     fish oil-omega-3 fatty acids 1000 MG capsule Take 1 g by mouth daily.     fluticasone (FLONASE) 50 MCG/ACT nasal spray Place 1 spray into both nostrils daily as needed for allergies or rhinitis.      metoprolol succinate (TOPROL-XL) 25 MG 24 hr tablet Take 1 tablet (25 mg total) by mouth daily. 30 tablet 6   metroNIDAZOLE (METROGEL) 0.75 % gel Apply 1 Application topically 2 (two) times daily as needed (rosacea).     olmesartan (BENICAR) 20 MG tablet Take 20 mg by mouth daily.     No current facility-administered medications for this visit.     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 Ali Lowe & Dr. Tenny Craw   Seasonal allergies 11/19/2014    ROS:   All systems reviewed and negative except as noted in the HPI.   Past Surgical History:  Procedure Laterality Date   EPIGASTRIC HERNIA REPAIR N/A 05/19/2020   Procedure: OPEN EPIGASTRIC HERNIA REPAIR WITH MESH;  Surgeon: Kinsinger, Arta Bruce, MD;  Location: [facility];  Service: General;  Laterality: N/A;   EYELID LACERATION REPAIR  2008   left and right   INTRAOPERATIVE TRANSTHORACIC ECHOCARDIOGRAM N/A 05/11/2022   Procedure: INTRAOPERATIVE TRANSTHORACIC ECHOCARDIOGRAM;  Surgeon: Early Osmond, MD;  Location: [place] [facility];  Service: Open Heart Surgery;  Laterality: N/A;   MANDIBLE SURGERY  1976   right removal -- cancer   MANDIBLE SURGERY  1987   right reconstruction   PACEMAKER IMPLANT N/A  05/17/2022   Procedure: PACEMAKER IMPLANT;  Surgeon: Evans Lance, MD;  Location: [place] [facility];  Service: Cardiovascular;  Laterality: N/A;   RIGHT/LEFT HEART CATH AND CORONARY ANGIOGRAPHY N/A 03/26/2022   Procedure: RIGHT/LEFT HEART CATH AND CORONARY ANGIOGRAPHY;  Surgeon: Early Osmond, MD;  Location: [place] [facility];  Service: Cardiovascular;  Laterality: N/A;   TRANSCATHETER AORTIC VALVE REPLACEMENT, TRANSFEMORAL N/A 05/11/2022   Procedure: Transcatheter Aortic Valve Replacement, Transfemoral;  Surgeon: Early Osmond, MD;  Location: [place] [facility];  Service: Open Heart Surgery;  Laterality: N/A;   TRANSTHORACIC ECHOCARDIOGRAM  07/18/2002   ef 71%     Family History  Problem Relation Age of Onset   Stroke  Mother    Heart attack Father    Hypertension Father    Hypertension Sister    Colon cancer Neg Hx    Stomach cancer Neg Hx    Rectal cancer Neg Hx    Esophageal cancer Neg Hx      Social History   Socioeconomic History   Marital status: Married    Spouse name: Brian Klein   Number of children: 2   Years of education: 18   Highest education level: Not on file  Occupational History   Occupation: retired  Tobacco Use   Smoking status: Never   Smokeless tobacco: Never  Vaping Use   Vaping Use: Never used  Substance and Sexual Activity   Alcohol use: No   Drug use: No   Sexual activity: Not on file  Other Topics Concern   Not on file  Social History Narrative   Right handed   Drinks caffeine, tea   One level   Retired   Lives with wife   Social Determinants of Radio broadcast assistant Strain: Not on file  Food Insecurity: Not on file  Transportation Needs: Not on file  Physical Activity: Not on file  Stress: Not on file  Social Connections: Not on file  Intimate Partner Violence: Not on file     BP 108/68   Pulse 62   Ht 5' 6"$  (1.676 m)   Wt 164 lb (74.4 kg)   SpO2 98%   BMI 26.47 kg/m   Physical Exam:  Well appearing NAD HEENT: Unremarkable Neck:  No JVD, no thyromegally Lymphatics:  No adenopathy Back:  No CVA tenderness Lungs:  Clear with no wheezes HEART:  Regular rate rhythm, no murmurs, no rubs, no clicks Abd:  soft, positive bowel sounds, no organomegally, no rebound, no guarding Ext:  2 plus pulses, no edema, no cyanosis, no clubbing Skin:  No rashes no nodules Neuro:  CN II through XII intact, motor grossly intact  EKG - atrial fib with ventricular sensing and pacing  DEVICE  Normal device function.  See PaceArt for details.   Assess/Plan: Heart block - he has some conduction.  PPM - his BS single chamber PM is working normally. Over 10 years on the battery. AS - he is asymptomatic s/p TAVR. Autonomic dysfunction - he is still having  some spells but not as bad as before his PPM. I encouraged him to stay hydrated and active. Brian Overlie Bilaal Leib,MD

## 2022-08-30 ENCOUNTER — Other Ambulatory Visit: Payer: Self-pay

## 2022-08-30 MED ORDER — METOPROLOL SUCCINATE ER 25 MG PO TB24: 25.0000 mg | ORAL_TABLET | Freq: Every day | ORAL | 3 refills | Status: DC

## 2022-08-30 NOTE — Telephone Encounter (Signed)
Pt's medication was sent to pt's pharmacy as requested. Confirmation received.  °

## 2022-08-31 DIAGNOSIS — R42 Dizziness and giddiness: Secondary | ICD-10-CM | POA: Diagnosis not present

## 2022-09-07 NOTE — Progress Notes (Signed)
Remote pacemaker transmission.   

## 2022-09-08 DIAGNOSIS — R42 Dizziness and giddiness: Secondary | ICD-10-CM | POA: Diagnosis not present

## 2022-09-15 DIAGNOSIS — R42 Dizziness and giddiness: Secondary | ICD-10-CM | POA: Diagnosis not present

## 2022-09-17 DIAGNOSIS — R42 Dizziness and giddiness: Secondary | ICD-10-CM | POA: Diagnosis not present

## 2022-09-21 DIAGNOSIS — R109 Unspecified abdominal pain: Secondary | ICD-10-CM | POA: Diagnosis not present

## 2022-09-21 DIAGNOSIS — I480 Paroxysmal atrial fibrillation: Secondary | ICD-10-CM | POA: Diagnosis not present

## 2022-09-21 DIAGNOSIS — C419 Malignant neoplasm of bone and articular cartilage, unspecified: Secondary | ICD-10-CM | POA: Diagnosis not present

## 2022-11-10 ENCOUNTER — Encounter: Payer: Self-pay | Admitting: Internal Medicine

## 2022-11-16 ENCOUNTER — Encounter: Payer: Self-pay | Admitting: Internal Medicine

## 2022-11-19 ENCOUNTER — Ambulatory Visit (INDEPENDENT_AMBULATORY_CARE_PROVIDER_SITE_OTHER): Payer: Medicare PPO

## 2022-11-19 DIAGNOSIS — I442 Atrioventricular block, complete: Secondary | ICD-10-CM | POA: Diagnosis not present

## 2022-11-19 LAB — CUP PACEART REMOTE DEVICE CHECK
Battery Remaining Longevity: 108 mo
Battery Remaining Percentage: 100 %
Brady Statistic RV Percent Paced: 29 %
Date Time Interrogation Session: 20240510001100
Implantable Lead Connection Status: 753985
Implantable Lead Implant Date: 20231106
Implantable Lead Location: 753860
Implantable Lead Model: 7842
Implantable Lead Serial Number: 1220619
Implantable Pulse Generator Implant Date: 20231106
Lead Channel Impedance Value: 680 Ohm
Lead Channel Pacing Threshold Amplitude: 0.7 V
Lead Channel Pacing Threshold Pulse Width: 0.4 ms
Lead Channel Setting Pacing Amplitude: 1.2 V
Lead Channel Setting Pacing Pulse Width: 0.4 ms
Lead Channel Setting Sensing Sensitivity: 2.5 mV
Pulse Gen Serial Number: 868568
Zone Setting Status: 755011

## 2022-11-23 ENCOUNTER — Other Ambulatory Visit (HOSPITAL_COMMUNITY): Payer: Self-pay

## 2022-11-23 ENCOUNTER — Telehealth: Payer: Self-pay

## 2022-11-23 ENCOUNTER — Telehealth: Payer: Self-pay | Admitting: Cardiology

## 2022-11-23 NOTE — Telephone Encounter (Addendum)
INSURANCE NEEDED [place] TO VERIFY INFO FOR PA REQUEST. FAXED INFO TO [employer] 11/23/22

## 2022-11-23 NOTE — Telephone Encounter (Signed)
Pt c/o medication issue:  1. Name of Medication:   apixaban (ELIQUIS) 5 MG TABS tablet   2. How are you currently taking this medication (dosage and times per day)?   3. Are you having a reaction (difficulty breathing--STAT)?   4. What is your medication issue?    Caller wants to get information for prior authorization for patient's

## 2022-11-24 MED ORDER — APIXABAN 5 MG PO TABS: 5.0000 mg | ORAL_TABLET | Freq: Two times a day (BID) | ORAL | 1 refills | Status: DC

## 2022-11-24 NOTE — Telephone Encounter (Signed)
Received fax PA was approved, refill sent to pharmacy.

## 2022-11-29 ENCOUNTER — Other Ambulatory Visit (HOSPITAL_COMMUNITY): Payer: Self-pay

## 2022-11-29 NOTE — Telephone Encounter (Signed)
Per test claim PA is approved. Pharmacy filled 11/24/22 next fill due 12/19/22

## 2022-12-07 NOTE — Progress Notes (Signed)
Remote pacemaker transmission.   

## 2023-01-06 ENCOUNTER — Ambulatory Visit: Payer: Medicare PPO | Admitting: Cardiology

## 2023-01-24 ENCOUNTER — Other Ambulatory Visit: Payer: Self-pay | Admitting: Family Medicine

## 2023-01-24 ENCOUNTER — Ambulatory Visit
Admission: RE | Admit: 2023-01-24 | Discharge: 2023-01-24 | Disposition: A | Discharge status: Home / Self Care | Payer: Medicare PPO | Source: Ambulatory Visit | Attending: Family Medicine | Admitting: Family Medicine

## 2023-01-24 DIAGNOSIS — S30860A Insect bite (nonvenomous) of lower back and pelvis, initial encounter: Secondary | ICD-10-CM | POA: Diagnosis not present

## 2023-01-24 DIAGNOSIS — D696 Thrombocytopenia, unspecified: Secondary | ICD-10-CM | POA: Diagnosis not present

## 2023-01-24 DIAGNOSIS — M79644 Pain in right finger(s): Secondary | ICD-10-CM | POA: Diagnosis not present

## 2023-01-24 DIAGNOSIS — W57XXXA Bitten or stung by nonvenomous insect and other nonvenomous arthropods, initial encounter: Secondary | ICD-10-CM | POA: Diagnosis not present

## 2023-01-24 DIAGNOSIS — M19041 Primary osteoarthritis, right hand: Secondary | ICD-10-CM | POA: Diagnosis not present

## 2023-02-10 ENCOUNTER — Telehealth: Payer: Self-pay

## 2023-02-10 ENCOUNTER — Encounter: Payer: Self-pay | Admitting: Internal Medicine

## 2023-02-10 ENCOUNTER — Ambulatory Visit: Payer: Medicare PPO | Admitting: Internal Medicine

## 2023-02-10 VITALS — BP 110/70 | HR 85 | Ht 66.0 in | Wt 161.0 lb

## 2023-02-10 DIAGNOSIS — Z7901 Long term (current) use of anticoagulants: Secondary | ICD-10-CM

## 2023-02-10 DIAGNOSIS — Z01818 Encounter for other preprocedural examination: Secondary | ICD-10-CM | POA: Diagnosis not present

## 2023-02-10 DIAGNOSIS — Z860100 Personal history of colon polyps, unspecified: Secondary | ICD-10-CM

## 2023-02-10 MED ORDER — PLENVU 140 G PO SOLR: 1.0000 | Freq: Once | ORAL | 0 refills | Status: AC

## 2023-02-10 NOTE — Telephone Encounter (Signed)
[facility] Pre-operative Risk Assessment     Request for surgical clearance:     Endoscopy Procedure  What type of surgery is being performed?     Colonoscopy  When is this surgery scheduled?     04/14/2023  What type of clearance is required ?   Pharmacy  Are there any medications that need to be held prior to surgery and how long? Eliquis - 3 days  Practice name and name of physician performing surgery?      [facility]  What is your office phone and fax number?      Phone- [phone]  Fax- [phone]  Anesthesia type (None, local, MAC, general) ?       MAC

## 2023-02-10 NOTE — Patient Instructions (Signed)
You have been scheduled for a colonoscopy. Please follow written instructions given to you at your visit today.   Please pick up your prep supplies at the pharmacy within the next 1-3 days.  If you use inhalers (even only as needed), please bring them with you on the day of your procedure.  DO NOT TAKE 7 DAYS PRIOR TO TEST- Trulicity (dulaglutide) Ozempic, Wegovy (semaglutide) Mounjaro (tirzepatide) Bydureon Bcise (exanatide extended release)  DO NOT TAKE 1 DAY PRIOR TO YOUR TEST Rybelsus (semaglutide) Adlyxin (lixisenatide) Victoza (liraglutide) Byetta (exanatide) ___________________________________________________________________________ _______________________________________________________  If your blood pressure at your visit was 140/90 or greater, please contact your primary care physician to follow up on this.  _______________________________________________________  If you are age 79 or older, your body mass index should be between 23-30. Your Body mass index is 25.99 kg/m. If this is out of the aforementioned range listed, please consider follow up with your Primary Care Provider.  If you are age 66 or younger, your body mass index should be between 19-25. Your Body mass index is 25.99 kg/m. If this is out of the aformentioned range listed, please consider follow up with your Primary Care Provider.   ________________________________________________________  The [facility] providers would like to encourage you to use [facility] to communicate with providers for non-urgent requests or questions.  Due to long hold times on the telephone, sending your provider a message by [facility] may be a faster and more efficient way to get a response.  Please allow 48 business hours for a response.  Please remember that this is for non-urgent requests.  _______________________________________________________

## 2023-02-10 NOTE — Progress Notes (Signed)
HISTORY OF PRESENT ILLNESS:  Brian Klein is a 77 y.o. male with a history of aortic valve disease status post TAVR October 2023 with normal EF, chronic atrial fibrillation on Eliquis, hypertension, hyperlipidemia, and multiple adenomatous colon polyps-for which he is followed in this office.  The patient presents today regarding surveillance colonoscopy.  He has undergone previous colonoscopy in 2003, 2008, 2013, most recently 2019.  3 polyps at the time of his last exam.  Due to a history of multiple adenomas, follow-up in 5 years recommended.  At that time his chronic anticoagulation was interrupted short-term for his procedure.  Patient tells me that he is doing well from cardiac standpoint.  He walks 1 hour every day.  In terms of his GI review of systems, entirely negative.  Blood work from November 2023 shows a normal hemoglobin of 13.3.  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 Lynnette Caffey & Dr. Delia Chimes   Seasonal allergies 11/19/2014    Past Surgical History:  Procedure Laterality Date   EPIGASTRIC HERNIA REPAIR N/A 05/19/2020   Procedure: OPEN EPIGASTRIC HERNIA REPAIR WITH MESH;  Surgeon: Kinsinger, De Blanch, MD;  Location: [facility];  Service: General;  Laterality: N/A;   EYELID LACERATION REPAIR  2008   left and right   INTRAOPERATIVE TRANSTHORACIC ECHOCARDIOGRAM N/A  05/11/2022   Procedure: INTRAOPERATIVE TRANSTHORACIC ECHOCARDIOGRAM;  Surgeon: Orbie Pyo, MD;  [facility]: [facility];  Service: Open Heart Surgery;  Laterality: N/A;   MANDIBLE SURGERY  1976   right removal -- cancer   MANDIBLE SURGERY  1987   right reconstruction   PACEMAKER IMPLANT N/A 05/17/2022   Procedure: PACEMAKER IMPLANT;  Surgeon: Marinus Maw, MD;  Location: [facility];  Service: Cardiovascular;  Laterality: N/A;   RIGHT/LEFT HEART CATH AND CORONARY ANGIOGRAPHY N/A 03/26/2022   Procedure: RIGHT/LEFT HEART CATH AND CORONARY ANGIOGRAPHY;  Surgeon: Orbie Pyo, MD;  Location: [facility];  Service: Cardiovascular;  Laterality: N/A;   TRANSCATHETER AORTIC VALVE REPLACEMENT, TRANSFEMORAL N/A 05/11/2022   Procedure: Transcatheter Aortic Valve Replacement, Transfemoral;  Surgeon: Orbie Pyo, MD;  Location: [facility];  Service: Open Heart Surgery;  Laterality: N/A;   TRANSTHORACIC ECHOCARDIOGRAM  07/18/2002   ef 71%    Social History Brian Klein  reports that he has never smoked. He has never used smokeless tobacco. He reports that he does not drink alcohol and does not use drugs.  family history includes Heart attack in his father; Hypertension in his father and sister; Stroke in his mother.  Allergies  Allergen Reactions   Crestor [Rosuvastatin] Other (See Comments)    Legs aches and pain        PHYSICAL EXAMINATION: Vital signs: BP 110/70   Pulse 85   Ht 5\' 6"  (1.676 m)  Wt 161 lb (73 kg)   BMI 25.99 kg/m   Constitutional: generally well-appearing, no acute distress Psychiatric: alert and oriented x3, cooperative Eyes: extraocular movements intact, anicteric, conjunctiva pink Mouth: oral pharynx moist, no lesions.  Distortion of the right mandible from prior surgery Neck: supple no lymphadenopathy Cardiovascular: heart irregular rate and rhythm, no murmur Lungs: clear to auscultation bilaterally Abdomen: soft,  nontender, nondistended, no obvious ascites, no peritoneal signs, normal bowel sounds, no organomegaly Rectal: Deferred until colonoscopy Extremities: no clubbing, cyanosis, or lower extremity edema bilaterally Skin: no lesions on visible extremities Neuro: No focal deficits.  Cranial nerves intact  ASSESSMENT:  1.  Personal history of multiple adenomatous colon polyps.  Due for surveillance 2.  Multiple general medical problems.  Stable 3.  History of chronic atrial fibrillation on chronic anticoagulation   PLAN:  1.  Surveillance colonoscopy.  We discussed the pros and cons of colonoscopy at his age with his comorbidities.  As he is active and doing well, he is motivated to proceed with surveillance colonoscopy.  He is higher than baseline risk due to his age and comorbidities.  As well, the need to address his chronic anticoagulation.The nature of the procedure, as well as the risks, benefits, and alternatives were carefully and thoroughly reviewed with the patient. Ample time for discussion and questions allowed. The patient understood, was satisfied, and agreed to proceed. 2.  Will hold his anticoagulation 2 to 3 days prior to his procedure, as previous.  We will confirm this with his cardiologist, Dr. Anne Fu.

## 2023-02-10 NOTE — Telephone Encounter (Signed)
Please advise holding Eliquis prior to endoscopy scheduled for 04/14/2023.  Thank you!  DW

## 2023-02-11 ENCOUNTER — Telehealth: Payer: Self-pay

## 2023-02-11 ENCOUNTER — Telehealth: Payer: Self-pay | Admitting: Internal Medicine

## 2023-02-11 NOTE — Telephone Encounter (Signed)
Inbound call from patient stating that his insurance will not cover his prep. Requesting alternative medication. Please advise.

## 2023-02-11 NOTE — Telephone Encounter (Signed)
Pt is scheduled for 09/23 at 2:20 pm. Med rec and consent done

## 2023-02-11 NOTE — Telephone Encounter (Signed)
Pt is scheduled for 09/23 at 2:20 pm. Med rec and consent done    Patient Consent for Virtual Visit        Brian Klein has provided verbal consent on 02/11/2023 for a virtual visit (video or telephone).   CONSENT FOR VIRTUAL VISIT FOR:  Brian Klein  By participating in this virtual visit I agree to the following:  I hereby voluntarily request, consent and authorize [facility] and its employed or contracted physicians, physician assistants, nurse practitioners or other licensed health care professionals (the Practitioner), to provide me with telemedicine health care services (the "Services") as deemed necessary by the treating Practitioner. I acknowledge and consent to receive the Services by the Practitioner via telemedicine. I understand that the telemedicine visit will involve communicating with the Practitioner through live audiovisual communication technology and the disclosure of certain medical information by electronic transmission. I acknowledge that I have been given the opportunity to request an in-person assessment or other available alternative prior to the telemedicine visit and am voluntarily participating in the telemedicine visit.  I understand that I have the right to withhold or withdraw my consent to the use of telemedicine in the course of my care at any time, without affecting my right to future care or treatment, and that the Practitioner or I may terminate the telemedicine visit at any time. I understand that I have the right to inspect all information obtained and/or recorded in the course of the telemedicine visit and may receive copies of available information for a reasonable fee.  I understand that some of the potential risks of receiving the Services via telemedicine include:  Delay or interruption in medical evaluation due to technological equipment failure or disruption; Information transmitted may not be sufficient (e.g. poor resolution of  images) to allow for appropriate medical decision making by the Practitioner; and/or  In rare instances, security protocols could fail, causing a breach of personal health information.  Furthermore, I acknowledge that it is my responsibility to provide information about my medical history, conditions and care that is complete and accurate to the best of my ability. I acknowledge that Practitioner's advice, recommendations, and/or decision may be based on factors not within their control, such as incomplete or inaccurate data provided by me or distortions of diagnostic images or specimens that may result from electronic transmissions. I understand that the practice of medicine is not an exact science and that Practitioner makes no warranties or guarantees regarding treatment outcomes. I acknowledge that a copy of this consent can be made available to me via my patient portal [facility]), or I can request a printed copy by calling the office of [facility].    I understand that my insurance will be billed for this visit.   I have read or had this consent read to me. I understand the contents of this consent, which adequately explains the benefits and risks of the Services being provided via telemedicine.  I have been provided ample opportunity to ask questions regarding this consent and the Services and have had my questions answered to my satisfaction. I give my informed consent for the services to be provided through the use of telemedicine in my medical care

## 2023-02-11 NOTE — Telephone Encounter (Signed)
Patient with diagnosis of atrial fibrillation on Eliquis for anticoagulation.    What type of surgery is being performed?     Colonoscopy  When is this surgery scheduled?     04/14/2023    CHA2DS2-VASc Score = 3   This indicates a 3.2% annual risk of stroke. The patient's score is based upon: CHF History: 0 HTN History: 1 Diabetes History: 0 Stroke History: 0 Vascular Disease History: 0 Age Score: 2 Gender Score: 0    CrCl 56 Platelet count 103  Per office protocol, patient can hold Eliquis for 2-3 days prior to procedure.   Patient will not need bridging with Lovenox (enoxaparin) around procedure.  **This guidance is not considered finalized until pre-operative APP has relayed final recommendations.**

## 2023-02-11 NOTE — Telephone Encounter (Signed)
   Name: Brian Klein  DOB: Jul 12, 1946  MRN: [id number]  Primary Cardiologist: Donato Schultz, MD   Preoperative team, please contact this patient and set up a phone call appointment for further preoperative risk assessment. Please obtain consent and complete medication review. Thank you for your help.  I confirm that guidance regarding antiplatelet and oral anticoagulation therapy has been completed and, if necessary, noted below.  Per pharm D, patient may hold Eliquis 2-3 days prior to procedure. Will not need bridging.    Carlos Levering, NP 02/11/2023, 11:48 AM [facility]

## 2023-02-14 NOTE — Telephone Encounter (Signed)
Spoke to patient and told him I would put a Plenvu sample up front for him to pick up.  Patient agreed.

## 2023-02-15 DIAGNOSIS — H353131 Nonexudative age-related macular degeneration, bilateral, early dry stage: Secondary | ICD-10-CM | POA: Diagnosis not present

## 2023-02-15 DIAGNOSIS — H35363 Drusen (degenerative) of macula, bilateral: Secondary | ICD-10-CM | POA: Diagnosis not present

## 2023-02-18 ENCOUNTER — Ambulatory Visit (INDEPENDENT_AMBULATORY_CARE_PROVIDER_SITE_OTHER): Payer: Medicare PPO

## 2023-02-18 DIAGNOSIS — I442 Atrioventricular block, complete: Secondary | ICD-10-CM

## 2023-02-18 LAB — CUP PACEART REMOTE DEVICE CHECK
Battery Remaining Longevity: 102 mo
Battery Remaining Percentage: 100 %
Brady Statistic RV Percent Paced: 27 %
Date Time Interrogation Session: 20240809001200
Implantable Lead Connection Status: 753985
Implantable Lead Implant Date: 20231106
Implantable Lead Location: 753860
Implantable Lead Model: 7842
Implantable Lead Serial Number: 1220619
Implantable Pulse Generator Implant Date: 20231106
Lead Channel Impedance Value: 635 Ohm
Lead Channel Pacing Threshold Amplitude: 0.9 V
Lead Channel Pacing Threshold Pulse Width: 0.4 ms
Lead Channel Setting Pacing Amplitude: 1.5 V
Lead Channel Setting Pacing Pulse Width: 0.4 ms
Lead Channel Setting Sensing Sensitivity: 2.5 mV
Pulse Gen Serial Number: 868568
Zone Setting Status: 755011

## 2023-02-23 ENCOUNTER — Other Ambulatory Visit: Payer: Self-pay | Admitting: Cardiology

## 2023-02-23 NOTE — Telephone Encounter (Signed)
Prescription refill request for Eliquis received. Indication: AF Last office visit: 08/26/22  Rosette Reveal MD Scr: 1.10 on 06/04/22 Age: 77 Weight: 74.4kg  Based on above findings Eliquis 5mg  twice daily is the appropriate dose.  Refill approved.

## 2023-03-04 NOTE — Progress Notes (Signed)
Remote pacemaker transmission.   

## 2023-03-23 ENCOUNTER — Other Ambulatory Visit: Payer: Self-pay | Admitting: Cardiology

## 2023-03-23 NOTE — Telephone Encounter (Signed)
Please advise if ok to refill non cardiac medication last filled by Dr. Anne Fu.

## 2023-03-29 ENCOUNTER — Encounter: Payer: Medicare PPO | Admitting: Psychology

## 2023-03-29 DIAGNOSIS — E041 Nontoxic single thyroid nodule: Secondary | ICD-10-CM | POA: Diagnosis not present

## 2023-03-29 DIAGNOSIS — E875 Hyperkalemia: Secondary | ICD-10-CM | POA: Diagnosis not present

## 2023-03-29 DIAGNOSIS — I10 Essential (primary) hypertension: Secondary | ICD-10-CM | POA: Diagnosis not present

## 2023-03-29 DIAGNOSIS — R7989 Other specified abnormal findings of blood chemistry: Secondary | ICD-10-CM | POA: Diagnosis not present

## 2023-03-29 DIAGNOSIS — Z125 Encounter for screening for malignant neoplasm of prostate: Secondary | ICD-10-CM | POA: Diagnosis not present

## 2023-03-29 DIAGNOSIS — Z1212 Encounter for screening for malignant neoplasm of rectum: Secondary | ICD-10-CM | POA: Diagnosis not present

## 2023-03-29 DIAGNOSIS — E785 Hyperlipidemia, unspecified: Secondary | ICD-10-CM | POA: Diagnosis not present

## 2023-03-31 ENCOUNTER — Encounter: Payer: Self-pay | Admitting: Internal Medicine

## 2023-04-04 ENCOUNTER — Ambulatory Visit: Payer: Medicare PPO | Attending: Cardiology | Admitting: Student

## 2023-04-04 DIAGNOSIS — Z01810 Encounter for preprocedural cardiovascular examination: Secondary | ICD-10-CM | POA: Diagnosis not present

## 2023-04-04 NOTE — Progress Notes (Signed)
Virtual Visit via Telephone Note   Because of Brian Klein's co-morbid illnesses, he is at least at moderate risk for complications without adequate follow up.  This format is felt to be most appropriate for this patient at this time.  The patient did not have access to video technology/had technical difficulties with video requiring transitioning to audio format only (telephone).  All issues noted in this document were discussed and addressed.  No physical exam could be performed with this format.  Please refer to the patient's chart for his consent to telehealth for [facility].  Evaluation Performed:  Preoperative cardiovascular risk assessment _____________   Date:  04/04/2023   Patient ID:  Brian Klein, DOB 10/07/1945, MRN [id number] Patient Location:  Home Provider location:   Office  Primary Care Provider:  Rodrigo Ran, MD Primary Cardiologist:  Donato Schultz, MD  Chief Complaint / Patient Profile   77 y.o. y/o male with a h/o permanent afib on anticoagulation, bicuspid aortic valve s/p TAVR October 2023, complete heart block s/p PPM, near-syncope, carotid artery disease, hypertension, hyperlipidemia who is pending colonoscopy by [facility] and presents today for telephonic preoperative cardiovascular risk assessment.  History of Present Illness    Brian Klein is a 77 y.o. male who presents via audio/video conferencing for a telehealth visit today.  Pt was last seen in cardiology clinic on 08/26/2022 by Dr. Ladona Ridgel.  At that time Brian Klein was stable from a cardiac standpoint.  The patient is now pending procedure as outlined above. Since his last visit, he is doing well. Patient denies shortness of breath, dyspnea on exertion, lower extremity edema, orthopnea or PND. No chest pain, pressure, or tightness. No palpitations. He will occasionally have some lightheadedness if he is bending over and gets up too fast. It is very brief and he has never passed  out.  He is very active walking for 1 hour every day.   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 Lynnette Caffey & Dr. Delia Chimes   Seasonal allergies 11/19/2014   Past Surgical History:  Procedure Laterality Date   EPIGASTRIC HERNIA REPAIR N/A 05/19/2020   Procedure: OPEN EPIGASTRIC HERNIA REPAIR WITH MESH;  Surgeon: Kinsinger, De Blanch, MD;  Location: [facility];  Service: General;  Laterality: N/A;   EYELID LACERATION REPAIR  2008   left and right   INTRAOPERATIVE TRANSTHORACIC ECHOCARDIOGRAM N/A 05/11/2022   Procedure: INTRAOPERATIVE TRANSTHORACIC ECHOCARDIOGRAM;  Surgeon: Orbie Pyo, MD;  [facility];  Service: Open Heart Surgery;  Laterality: N/A;   MANDIBLE SURGERY  1976   right removal -- cancer   MANDIBLE SURGERY  1987   right reconstruction   PACEMAKER IMPLANT N/A 05/17/2022   Procedure: PACEMAKER IMPLANT;  Surgeon: Marinus Maw, MD;  Location: [facility];  Service: Cardiovascular;  Laterality: N/A;   RIGHT/LEFT HEART CATH AND CORONARY  ANGIOGRAPHY N/A 03/26/2022   Procedure: RIGHT/LEFT HEART CATH AND CORONARY ANGIOGRAPHY;  Surgeon: Orbie Pyo, MD;  [facility]: [facility];  Service: Cardiovascular;  Laterality: N/A;   TRANSCATHETER AORTIC VALVE REPLACEMENT, TRANSFEMORAL N/A 05/11/2022   Procedure: Transcatheter Aortic Valve Replacement, Transfemoral;  Surgeon: Orbie Pyo, MD;  [facility]: [facility];  Service:  Open Heart Surgery;  Laterality: N/A;   TRANSTHORACIC ECHOCARDIOGRAM  07/18/2002   ef 71%    Allergies  Allergies  Allergen Reactions   Crestor [Rosuvastatin] Other (See Comments)    Legs aches and pain     Home Medications    Prior to Admission medications   Medication Sig Start Date End Date Taking? Authorizing Provider  acetaminophen (TYLENOL) 500 MG tablet Take 1,000 mg by mouth daily as needed (pain).    [provider]  amLODipine (NORVASC) 5 MG tablet Take 5 mg by mouth daily. 07/26/22   [provider]  amoxicillin (AMOXIL) 500 MG capsule TAKE FOUR CAPSULES BY MOUTH 1 HOUR BEFORE DENTAL APPOINTMENT 03/23/23   Jake Bathe, MD  apixaban (ELIQUIS) 5 MG TABS tablet TAKE 1 TABLET BY MOUTH TWICE DAILY 02/23/23   Jake Bathe, MD  BELSOMRA 20 MG TABS Take 20 mg by mouth at bedtime. 01/20/22   [provider]  Cholecalciferol (VITAMIN D) 2000 UNITS tablet Take 2,000 Units by mouth daily.    [provider]  Coenzyme Q10 (CO Q 10) 100 MG CAPS Take 100 mg by mouth daily.    [provider]  ezetimibe-simvastatin (VYTORIN) 10-40 MG per tablet Take 1 tablet by mouth daily.    [provider]  fish oil-omega-3 fatty acids 1000 MG capsule Take 1 g by mouth daily.    [provider]  fluticasone (FLONASE) 50 MCG/ACT nasal spray Place 1 spray into both nostrils daily as needed for allergies or rhinitis.     [provider]  metoprolol succinate (TOPROL-XL) 25 MG 24 hr tablet Take 1 tablet (25 mg total) by mouth daily. 08/30/22   Marinus Maw, MD  metroNIDAZOLE (METROGEL) 0.75 % gel Apply 1 Application topically 2 (two) times daily as needed (rosacea).    [provider]  olmesartan (BENICAR) 20 MG tablet Take 20 mg by mouth daily.    [provider]    Physical Exam    Vital Signs:  Brian Klein does not have vital signs available for review today.  Given telephonic nature of communication,  physical exam is limited. AAOx3. NAD. Normal affect.  Speech and respirations are unlabored.  Accessory Clinical Findings    None  Assessment & Plan    Primary Cardiologist: Donato Schultz, MD  Preoperative cardiovascular risk assessment. Colonoscopy by [facility] on 04/14/2023.  Chart reviewed as part of pre-operative protocol coverage. According to the RCRI, patient has a 0.9% risk of MACE. Patient reports activity equivalent to >4.0 METS (walks 1 hour daily).   Given past medical history and time since last visit, based on ACC/AHA guidelines, LACIE KULIGOWSKI Klein would be at acceptable risk for the planned procedure without further cardiovascular testing.   Patient was advised that if he develops new symptoms prior to surgery to contact our office to arrange a follow-up appointment.  he verbalized understanding.  Per Pharm D, patient may hold Eliquis for 2-3 days prior to procedure.  Patient will not need bridging with Lovenox around procedure.   I will route this recommendation to the requesting party via  Epic fax function.  Please call with questions.  Time:   Today, I have spent 5 minutes with the patient with telehealth technology discussing medical history, symptoms, and management plan.     Carlos Levering, NP  04/04/2023, 8:45 AM

## 2023-04-05 DIAGNOSIS — Z23 Encounter for immunization: Secondary | ICD-10-CM | POA: Diagnosis not present

## 2023-04-05 DIAGNOSIS — E785 Hyperlipidemia, unspecified: Secondary | ICD-10-CM | POA: Diagnosis not present

## 2023-04-05 DIAGNOSIS — D696 Thrombocytopenia, unspecified: Secondary | ICD-10-CM | POA: Diagnosis not present

## 2023-04-05 DIAGNOSIS — R82998 Other abnormal findings in urine: Secondary | ICD-10-CM | POA: Diagnosis not present

## 2023-04-05 DIAGNOSIS — I480 Paroxysmal atrial fibrillation: Secondary | ICD-10-CM | POA: Diagnosis not present

## 2023-04-05 DIAGNOSIS — E041 Nontoxic single thyroid nodule: Secondary | ICD-10-CM | POA: Diagnosis not present

## 2023-04-05 DIAGNOSIS — D6869 Other thrombophilia: Secondary | ICD-10-CM | POA: Diagnosis not present

## 2023-04-05 DIAGNOSIS — I10 Essential (primary) hypertension: Secondary | ICD-10-CM | POA: Diagnosis not present

## 2023-04-05 DIAGNOSIS — Z0000 Encounter for general adult medical examination without abnormal findings: Secondary | ICD-10-CM | POA: Diagnosis not present

## 2023-04-05 DIAGNOSIS — Z950 Presence of cardiac pacemaker: Secondary | ICD-10-CM | POA: Diagnosis not present

## 2023-04-06 ENCOUNTER — Encounter: Payer: Medicare PPO | Admitting: Psychology

## 2023-04-06 DIAGNOSIS — Z85828 Personal history of other malignant neoplasm of skin: Secondary | ICD-10-CM | POA: Diagnosis not present

## 2023-04-06 DIAGNOSIS — L570 Actinic keratosis: Secondary | ICD-10-CM | POA: Diagnosis not present

## 2023-04-06 DIAGNOSIS — L718 Other rosacea: Secondary | ICD-10-CM | POA: Diagnosis not present

## 2023-04-07 ENCOUNTER — Other Ambulatory Visit: Payer: Self-pay | Admitting: Internal Medicine

## 2023-04-07 DIAGNOSIS — E041 Nontoxic single thyroid nodule: Secondary | ICD-10-CM

## 2023-04-14 ENCOUNTER — Ambulatory Visit: Payer: Medicare PPO | Admitting: Internal Medicine

## 2023-04-14 ENCOUNTER — Encounter: Payer: Self-pay | Admitting: Internal Medicine

## 2023-04-14 VITALS — BP 102/76 | HR 71 | Temp 97.3°F | Resp 20 | Ht 66.0 in | Wt 161.0 lb

## 2023-04-14 DIAGNOSIS — Z09 Encounter for follow-up examination after completed treatment for conditions other than malignant neoplasm: Secondary | ICD-10-CM

## 2023-04-14 DIAGNOSIS — I4891 Unspecified atrial fibrillation: Secondary | ICD-10-CM | POA: Diagnosis not present

## 2023-04-14 DIAGNOSIS — D120 Benign neoplasm of cecum: Secondary | ICD-10-CM | POA: Diagnosis not present

## 2023-04-14 DIAGNOSIS — D122 Benign neoplasm of ascending colon: Secondary | ICD-10-CM | POA: Diagnosis not present

## 2023-04-14 DIAGNOSIS — D123 Benign neoplasm of transverse colon: Secondary | ICD-10-CM

## 2023-04-14 DIAGNOSIS — I10 Essential (primary) hypertension: Secondary | ICD-10-CM | POA: Diagnosis not present

## 2023-04-14 DIAGNOSIS — Z860100 Personal history of colon polyps, unspecified: Secondary | ICD-10-CM | POA: Diagnosis not present

## 2023-04-14 HISTORY — PX: COLONOSCOPY: SHX174

## 2023-04-14 MED ORDER — SODIUM CHLORIDE 0.9 % IV SOLN: 500.0000 mL | Freq: Once | INTRAVENOUS | Status: DC

## 2023-04-14 NOTE — Progress Notes (Signed)
Pt's states no medical or surgical changes since previsit or office visit. 

## 2023-04-14 NOTE — Progress Notes (Signed)
Expand All Collapse All HISTORY OF PRESENT ILLNESS:   Brian Klein is a 77 y.o. male with a history of aortic valve disease status post TAVR October 2023 with normal EF, chronic atrial fibrillation on Eliquis, hypertension, hyperlipidemia, and multiple adenomatous colon polyps-for which he is followed in this office.  The patient presents today regarding surveillance colonoscopy.   He has undergone previous colonoscopy in 2003, 2008, 2013, most recently 2019.  3 polyps at the time of his last exam.  Due to a history of multiple adenomas, follow-up in 5 years recommended.  At that time his chronic anticoagulation was interrupted short-term for his procedure.  Patient tells me that he is doing well from cardiac standpoint.  He walks 1 hour every day.  In terms of his GI review of systems, entirely negative.   Blood work from November 2023 shows a normal hemoglobin of 13.3.   REVIEW OF SYSTEMS:   All non-GI ROS negative unless otherwise stated in the HPI except for sleeping problems       Past Medical History:  Diagnosis Date   Abnormal CT scan 11/26/2015   Atrial fibrillation (HCC)     Atrial fibrillation, chronic (HCC) 11/26/2015   AVD (aortic valve disease)      with bicupsid aortic valve   Chronic anticoagulation 11/26/2015   Erectile dysfunction 01/10/2013   Hypercholesterolemia 12/16/2010   Hyperlipidemia     Hypertension     MVP (mitral valve prolapse)     Osteogenic sarcoma (HCC) 1976    jaw bone   S/P TAVR (transcatheter aortic valve replacement) 05/11/2022    s/p TAVR with a 26 mm Edwards S3UR via the TF appoach by Dr. Lynnette Caffey & Dr. Delia Chimes   Seasonal allergies 11/19/2014               Past Surgical History:  Procedure Laterality Date   EPIGASTRIC HERNIA REPAIR N/A 05/19/2020    Procedure: OPEN EPIGASTRIC HERNIA REPAIR WITH MESH;  Surgeon: Kinsinger, De Blanch, MD;  Location: [facility];  Service: General;  Laterality: N/A;   EYELID LACERATION REPAIR   2008    left  and right   INTRAOPERATIVE TRANSTHORACIC ECHOCARDIOGRAM N/A 05/11/2022    Procedure: INTRAOPERATIVE TRANSTHORACIC ECHOCARDIOGRAM;  Surgeon: Orbie Pyo, MD;  [facility];  Service: Open Heart Surgery;  Laterality: N/A;   MANDIBLE SURGERY   1976    right removal -- cancer   MANDIBLE SURGERY   1987    right reconstruction   PACEMAKER IMPLANT N/A 05/17/2022    Procedure: PACEMAKER IMPLANT;  Surgeon: Marinus Maw, MD;  [facility];  Service: Cardiovascular;  Laterality: N/A;   RIGHT/LEFT HEART CATH AND CORONARY ANGIOGRAPHY N/A 03/26/2022    Procedure: RIGHT/LEFT HEART CATH AND CORONARY ANGIOGRAPHY;  Surgeon: Orbie Pyo, MD;  [facility];  Service: Cardiovascular;  Laterality: N/A;   TRANSCATHETER AORTIC VALVE REPLACEMENT, TRANSFEMORAL N/A 05/11/2022    Procedure: Transcatheter Aortic Valve Replacement, Transfemoral;  Surgeon: Orbie Pyo, MD;  [facility];  Service: Open Heart Surgery;  Laterality: N/A;   TRANSTHORACIC ECHOCARDIOGRAM   07/18/2002    ef 71%          Social History Brian Klein  reports that he has never smoked. He has never used smokeless tobacco. He reports that he does not drink alcohol and does not use drugs.   family history includes Heart attack in his father; Hypertension in  his father and sister; Stroke in his mother.   Allergies       Allergies  Allergen Reactions   Crestor [Rosuvastatin] Other (See Comments)      Legs aches and pain             PHYSICAL EXAMINATION: Vital signs: BP 110/70   Pulse 85   Ht 5\' 6"  (1.676 m)   Wt 161 lb (73 kg)   BMI 25.99 kg/m   Constitutional: generally well-appearing, no acute distress Psychiatric: alert and oriented x3, cooperative Eyes: extraocular movements intact, anicteric, conjunctiva pink Mouth: oral pharynx moist, no lesions.  Distortion of the right mandible from prior surgery Neck: supple no  lymphadenopathy Cardiovascular: heart irregular rate and rhythm, no murmur Lungs: clear to auscultation bilaterally Abdomen: soft, nontender, nondistended, no obvious ascites, no peritoneal signs, normal bowel sounds, no organomegaly Rectal: Deferred until colonoscopy Extremities: no clubbing, cyanosis, or lower extremity edema bilaterally Skin: no lesions on visible extremities Neuro: No focal deficits.  Cranial nerves intact   ASSESSMENT:   1.  Personal history of multiple adenomatous colon polyps.  Due for surveillance 2.  Multiple general medical problems.  Stable 3.  History of chronic atrial fibrillation on chronic anticoagulation     PLAN:   1.  Surveillance colonoscopy.  We discussed the pros and cons of colonoscopy at his age with his comorbidities.  As he is active and doing well, he is motivated to proceed with surveillance colonoscopy.  He is higher than baseline risk due to his age and comorbidities.  As well, the need to address his chronic anticoagulation.The nature of the procedure, as well as the risks, benefits, and alternatives were carefully and thoroughly reviewed with the patient. Ample time for discussion and questions allowed. The patient understood, was satisfied, and agreed to proceed. 2.  Will hold his anticoagulation 2 to 3 days prior to his procedure, as previous.  We will confirm this with his cardiologist, Dr. Anne Fu.    No interval change in history or physical exam.  Now for colonoscopy.  Has held his Eliquis for 2 full days

## 2023-04-14 NOTE — Progress Notes (Signed)
Called to room to assist during endoscopic procedure.  Patient ID and intended procedure confirmed with present staff. Received instructions for my participation in the procedure from the performing physician.  

## 2023-04-14 NOTE — Patient Instructions (Addendum)
YOU HAD AN ENDOSCOPIC PROCEDURE TODAY AT THE [facility]:   Refer to the procedure report that was given to you for any specific questions about what was found during the examination.  If the procedure report does not answer your questions, please call your gastroenterologist to clarify.  If you requested that your care partner not be given the details of your procedure findings, then the procedure report has been included in a sealed envelope for you to review at your convenience later.  Restart your Eliquis  today at previous dose.     .  Read all of the handouts given to you by your recovery room nurse.  Resume all of your previous medications as ordered.   YOU SHOULD EXPECT: Some feelings of bloating in the abdomen. Passage of more gas than usual.  Walking can help get rid of the air that was put into your GI tract during the procedure and reduce the bloating. If you had a lower endoscopy (such as a colonoscopy or flexible sigmoidoscopy) you may notice spotting of blood in your stool or on the toilet paper. If you underwent a bowel prep for your procedure, you may not have a normal bowel movement for a few days.  Please Note:  You might notice some irritation and congestion in your nose or some drainage.  This is from the oxygen used during your procedure.  There is no need for concern and it should clear up in a day or so.  SYMPTOMS TO REPORT IMMEDIATELY:  Following lower endoscopy (colonoscopy or flexible sigmoidoscopy):  Excessive amounts of blood in the stool  Significant tenderness or worsening of abdominal pains  Swelling of the abdomen that is new, acute  Fever of 100F or higher    For urgent or emergent issues, a gastroenterologist can be reached at any hour by calling [phone]. Do not use MyChart messaging for urgent concerns.    DIET:  We do recommend a small meal at first, but then you may proceed to your regular diet.  Drink plenty of fluids but you should  avoid alcoholic beverages for 24 hours. Try to increase the fiber in your diet, ad drink plenty of water.  ACTIVITY:  You should plan to take it easy for the rest of today and you should NOT DRIVE or use heavy machinery until tomorrow (because of the sedation medicines used during the test).    FOLLOW UP: Our staff will call the number listed on your records the next business day following your procedure.  We will call around 7:15- 8:00 am to check on you and address any questions or concerns that you may have regarding the information given to you following your procedure. If we do not reach you, we will leave a message.     If any biopsies were taken you will be contacted by phone or by letter within the next 1-3 weeks.  Please call us at [phone] if you have not heard about the biopsies in 3 weeks.    SIGNATURES/CONFIDENTIALITY: You and/or your care partner have signed paperwork which will be entered into your electronic medical record.  These signatures attest to the fact that that the information above on your After Visit Summary has been reviewed and is understood.  Full responsibility of the confidentiality of this discharge information lies with you and/or your care-partner.

## 2023-04-14 NOTE — Progress Notes (Signed)
Uneventful anesthetic. Report to pacu rn. Vss. Care resumed by rn. 

## 2023-04-14 NOTE — Op Note (Signed)
[facility] Patient Name: Brian Klein Procedure Date: 04/14/2023 8:18 AM MRN: [id number] Endoscopist: Wilhemina Bonito. Marina Goodell , MD, [id number] Age: 77 Referring MD:  Date of Birth: [birth year] Gender: Male Account #: [id number] Procedure:                Colonoscopy with cold share x 5 ; bx x 1 Indications:              High risk colon cancer surveillance: Personal                            history of multiple (3 or more) adenomas. Prior                            2003, 08, 13, 19 Medicines:                Monitored Anesthesia Care Procedure:                Pre-Anesthesia Assessment:                           - Prior to the procedure, a History and Physical                            was performed, and patient medications and                            allergies were reviewed. The patient's tolerance of                            previous anesthesia was also reviewed. The risks                            and benefits of the procedure and the sedation                            options and risks were discussed with the patient.                            All questions were answered, and informed consent                            was obtained. Prior Anticoagulants: The patient has                            taken Eliquis (apixaban), last dose was 3 days                            prior to procedure. ASA Grade Assessment: III - A                            patient with severe systemic disease. After                            reviewing the risks and benefits, the patient was  deemed in satisfactory condition to undergo the                            procedure.                           After obtaining informed consent, the colonoscope                            was passed under direct vision. Throughout the                            procedure, the patient's blood pressure, pulse, and                            oxygen saturations were monitored continuously. The                             Olympus Scope [id number] was introduced through the                            anus and advanced to the the cecum, identified by                            appendiceal orifice and ileocecal valve. The                            ileocecal valve, appendiceal orifice, and rectum                            were photographed. The quality of the bowel                            preparation was excellent. The colonoscopy was                            performed without difficulty. The patient tolerated                            the procedure well. The bowel preparation used was                            SUPREP via split dose instruction. Scope In: 8:32:30 AM Scope Out: 8:51:34 AM Scope Withdrawal Time: 0 hours 15 minutes 59 seconds  Total Procedure Duration: 0 hours 19 minutes 4 seconds  Findings:                 Five polyps were found in the transverse colon and                            ascending colon. The polyps were 2 to 6 mm in size.                            These polyps were removed with a cold snare.  Resection and retrieval were complete.                           A 1 mm polyp was found in the cecum. The polyp was                            removed with a jumbo cold forceps. Resection and                            retrieval were complete.                           Multiple diverticula were found in the sigmoid                            colon.                           The exam was otherwise without abnormality on                            direct and retroflexion views. Complications:            No immediate complications. Estimated blood loss:                            None. Estimated Blood Loss:     Estimated blood loss: none. Recommendation:           - Repeat colonoscopy is not recommended for                            surveillance.                           - Resume Eliquis (apixaban) today at prior dose.                            - Patient has a contact number available for                            emergencies. The signs and symptoms of potential                            delayed complications were discussed with the                            patient. Return to normal activities tomorrow.                            Written discharge instructions were provided to the                            patient.                           - Resume previous diet.                           -  Continue present medications.                           - Await pathology results. Wilhemina Bonito. Marina Goodell, MD 04/14/2023 9:10:54 AM This report has been signed electronically.

## 2023-04-15 ENCOUNTER — Telehealth: Payer: Self-pay | Admitting: *Deleted

## 2023-04-15 NOTE — Telephone Encounter (Signed)
  Follow up Call-     04/14/2023    7:19 AM  Call back number  Post procedure Call Back phone  # [phone]  Permission to leave phone message Yes     Patient questions:  Do you have a fever, pain , or abdominal swelling? No. Pain Score  0 *  Have you tolerated food without any problems? Yes.    Have you been able to return to your normal activities? Yes.    Do you have any questions about your discharge instructions: Diet   No. Medications  No. Follow up visit  No.  Do you have questions or concerns about your Care? No.  Actions: * If pain score is 4 or above: No action needed, pain <4.

## 2023-04-18 ENCOUNTER — Ambulatory Visit
Admission: RE | Admit: 2023-04-18 | Discharge: 2023-04-18 | Disposition: A | Discharge status: Home / Self Care | Payer: Medicare PPO | Source: Ambulatory Visit | Attending: Internal Medicine | Admitting: Internal Medicine

## 2023-04-18 ENCOUNTER — Encounter: Payer: Self-pay | Admitting: Internal Medicine

## 2023-04-18 DIAGNOSIS — E041 Nontoxic single thyroid nodule: Secondary | ICD-10-CM

## 2023-04-18 DIAGNOSIS — E042 Nontoxic multinodular goiter: Secondary | ICD-10-CM | POA: Diagnosis not present

## 2023-04-18 LAB — SURGICAL PATHOLOGY

## 2023-05-06 ENCOUNTER — Other Ambulatory Visit (HOSPITAL_COMMUNITY): Payer: Medicare PPO

## 2023-05-06 ENCOUNTER — Ambulatory Visit: Payer: Medicare PPO

## 2023-05-13 ENCOUNTER — Ambulatory Visit (HOSPITAL_COMMUNITY): Payer: Medicare PPO | Attending: Cardiology

## 2023-05-13 DIAGNOSIS — Z952 Presence of prosthetic heart valve: Secondary | ICD-10-CM | POA: Insufficient documentation

## 2023-05-13 LAB — ECHOCARDIOGRAM COMPLETE
AR max vel: 0.96 cm2
AV Area VTI: 1.09 cm2
AV Area mean vel: 1.05 cm2
AV Mean grad: 12.6 mm[Hg]
AV Peak grad: 24.8 mm[Hg]
Ao pk vel: 2.49 m/s
Area-P 1/2: 3.41 cm2
P 1/2 time: 441 ms
S' Lateral: 2.8 cm

## 2023-05-16 ENCOUNTER — Ambulatory Visit: Payer: Medicare PPO | Attending: Cardiology | Admitting: Cardiology

## 2023-05-16 ENCOUNTER — Encounter: Payer: Self-pay | Admitting: Cardiology

## 2023-05-16 VITALS — BP 110/62 | HR 91 | Ht 66.0 in | Wt 162.0 lb

## 2023-05-16 DIAGNOSIS — I442 Atrioventricular block, complete: Secondary | ICD-10-CM

## 2023-05-16 DIAGNOSIS — I4821 Permanent atrial fibrillation: Secondary | ICD-10-CM

## 2023-05-16 DIAGNOSIS — Z952 Presence of prosthetic heart valve: Secondary | ICD-10-CM

## 2023-05-16 NOTE — Patient Instructions (Signed)
Medication Instructions:  The current medical regimen is effective;  continue present plan and medications.  *If you need a refill on your cardiac medications before your next appointment, please call your pharmacy*  Follow-Up: At [facility], you and your health needs are our priority.  As part of our continuing mission to provide you with exceptional heart care, we have created designated Provider Care Teams.  These Care Teams include your primary Cardiologist (physician) and Advanced Practice Providers (APPs -  Physician Assistants and Nurse Practitioners) who all work together to provide you with the care you need, when you need it.  We recommend signing up for the patient portal called "MyChart".  Sign up information is provided on this After Visit Summary.  MyChart is used to connect with patients for Virtual Visits (Telemedicine).  Patients are able to view lab/test results, encounter notes, upcoming appointments, etc.  Non-urgent messages can be sent to your provider as well.   To learn more about what you can do with MyChart, go to [email or web address].    Your next appointment:   6 month(s)  Provider:   Jari Favre, PA-C, Robin Searing, NP, Jacolyn Reedy, PA-C, Eligha Bridegroom, NP, Tereso Newcomer, PA-C, or Perlie Gold, PA-C     Then, Donato Schultz, MD will plan to see you again in 1 year(s).

## 2023-05-16 NOTE — Progress Notes (Signed)
  Cardiology Office Note:  .   Date:  05/16/2023  ID:  Brian Klein, DOB [birth year], MRN [id number] PCP: Rodrigo Ran, MD  [facility] Providers Cardiologist:  Donato Schultz, MD Electrophysiologist:  Lewayne Bunting, MD     History of Present Illness: .   Brian Klein is a 77 y.o. male Discussed with the use of AI scribe software  History of Present Illness   The 77 year old patient, with a history of aortic valve replacement via TAVR, permanent atrial fibrillation, complete heart block status post pacemaker, carotid artery disease, hypertension, and hyperlipidemia, presents for a post-TAVR follow-up. The TAVR procedure was performed on 05/11/2022.    The patient's recent echocardiogram shows an EF of 60%, a normally functioning TAVR valve with a mean gradient of 12.6 mL of mercury, and trace perivalvular leak. There is also mild MR and mild to moderate tricuspid regurgitation.  He also reports a significant improvement in previously experienced dizzy spells, which have largely resolved.  The patient's spouse reports that the patient had been experiencing episodes of dizziness and changes in appetite, which he believes may be related to the vagus nerve. These episodes have since improved. He reports maintaining an active lifestyle, including daily walks.      Discussed with wife as well.     Studies Reviewed: Marland Kitchen        ECHO as above Risk Assessment/Calculations:            Physical Exam:   VS:  BP 110/62   Pulse 91   Ht 5\' 6"  (1.676 m)   Wt 162 lb (73.5 kg)   SpO2 98%   BMI 26.15 kg/m    Wt Readings from Last 3 Encounters:  05/16/23 162 lb (73.5 kg)  04/14/23 161 lb (73 kg)  02/10/23 161 lb (73 kg)    GEN: Well nourished, well developed in no acute distress NECK: No JVD; No carotid bruits CARDIAC: RRR, no murmurs, no rubs, no gallops RESPIRATORY:  Clear to auscultation without rales, wheezing or rhonchi  ABDOMEN: Soft, non-tender,  non-distended EXTREMITIES:  No edema; No deformity   ASSESSMENT AND PLAN: .    Assessment and Plan    Post TAVR Over a year post procedure, echocardiogram shows normal functioning TAVR valve with mean gradient of 12.6 mmHg and trace perivalvular leak. EF 60%. Mild MR and mild to moderate tricuspid regurgitation. NYHA class 1 symptoms. -Continue current management.  LDL 80  Atrial Fibrillation Permanent atrial fibrillation on anticoagulation. -Continue Eliquis 5mg  twice daily. -Has dual-chamber pacemaker placed on 05/17/2022 Dr. Ladona Ridgel hemoglobin 13.3 creatinine 1.1 TSH 2.6  Syncope History of near syncope. Reports occasional lightheadedness when getting up. No recent syncope. Possible vagal component to previous episodes. -Continue current management.  Hypertension Managed with olmesartan 20mg  daily, metoprolol 25mg  daily, and amlodipine 5mg  daily. -Continue current medications.  Hyperlipidemia Managed with Vytorin 10/40mg  daily. -Continue Vytorin 10/40mg  daily.    Follow-up Patient to be seen by physician assistant in 6 months and return for follow-up with physician in 1 year.             Dispo:   Signed, Donato Schultz, MD

## 2023-05-20 ENCOUNTER — Ambulatory Visit (INDEPENDENT_AMBULATORY_CARE_PROVIDER_SITE_OTHER): Payer: Medicare PPO

## 2023-05-20 DIAGNOSIS — I442 Atrioventricular block, complete: Secondary | ICD-10-CM | POA: Diagnosis not present

## 2023-05-20 LAB — CUP PACEART REMOTE DEVICE CHECK
Battery Remaining Longevity: 102 mo
Battery Remaining Percentage: 100 %
Brady Statistic RV Percent Paced: 25 %
Date Time Interrogation Session: 20241108001100
Implantable Lead Connection Status: 753985
Implantable Lead Implant Date: 20231106
Implantable Lead Location: 753860
Implantable Lead Model: 7842
Implantable Lead Serial Number: 1220619
Implantable Pulse Generator Implant Date: 20231106
Lead Channel Impedance Value: 626 Ohm
Lead Channel Pacing Threshold Amplitude: 0.9 V
Lead Channel Pacing Threshold Pulse Width: 0.4 ms
Lead Channel Setting Pacing Amplitude: 1.4 V
Lead Channel Setting Pacing Pulse Width: 0.4 ms
Lead Channel Setting Sensing Sensitivity: 2.5 mV
Pulse Gen Serial Number: 868568
Zone Setting Status: 755011

## 2023-05-27 ENCOUNTER — Other Ambulatory Visit: Payer: Self-pay | Admitting: Internal Medicine

## 2023-05-31 NOTE — Progress Notes (Signed)
Remote pacemaker transmission.   

## 2023-06-23 ENCOUNTER — Other Ambulatory Visit: Payer: Self-pay | Admitting: Cardiology

## 2023-06-23 NOTE — Telephone Encounter (Signed)
Prescription refill request for Eliquis received. Indication: AF Last office visit: 05/16/23  Michele Rockers MD Scr: 1.10 on 06/04/22  Epic Age: 77 Weight: 73.5kg  Based on above findings Eliquis 5mg  twice daily is the appropriate dose.  Refill approved.

## 2023-08-19 ENCOUNTER — Ambulatory Visit (INDEPENDENT_AMBULATORY_CARE_PROVIDER_SITE_OTHER): Payer: Medicare PPO

## 2023-08-19 DIAGNOSIS — I442 Atrioventricular block, complete: Secondary | ICD-10-CM

## 2023-08-20 ENCOUNTER — Encounter (HOSPITAL_BASED_OUTPATIENT_CLINIC_OR_DEPARTMENT_OTHER): Payer: Self-pay | Admitting: Emergency Medicine

## 2023-08-20 ENCOUNTER — Emergency Department (HOSPITAL_BASED_OUTPATIENT_CLINIC_OR_DEPARTMENT_OTHER): Payer: Medicare PPO

## 2023-08-20 ENCOUNTER — Emergency Department (HOSPITAL_BASED_OUTPATIENT_CLINIC_OR_DEPARTMENT_OTHER)
Admission: EM | Admit: 2023-08-20 | Discharge: 2023-08-20 | Disposition: A | Discharge status: Home / Self Care | Payer: Medicare PPO | Attending: Emergency Medicine | Admitting: Emergency Medicine

## 2023-08-20 ENCOUNTER — Other Ambulatory Visit: Payer: Self-pay

## 2023-08-20 DIAGNOSIS — I517 Cardiomegaly: Secondary | ICD-10-CM | POA: Diagnosis not present

## 2023-08-20 DIAGNOSIS — N400 Enlarged prostate without lower urinary tract symptoms: Secondary | ICD-10-CM | POA: Insufficient documentation

## 2023-08-20 DIAGNOSIS — I700 Atherosclerosis of aorta: Secondary | ICD-10-CM | POA: Diagnosis not present

## 2023-08-20 DIAGNOSIS — Z7901 Long term (current) use of anticoagulants: Secondary | ICD-10-CM | POA: Insufficient documentation

## 2023-08-20 DIAGNOSIS — Z041 Encounter for examination and observation following transport accident: Secondary | ICD-10-CM | POA: Diagnosis not present

## 2023-08-20 DIAGNOSIS — Y92410 Unspecified street and highway as the place of occurrence of the external cause: Secondary | ICD-10-CM | POA: Insufficient documentation

## 2023-08-20 DIAGNOSIS — R9082 White matter disease, unspecified: Secondary | ICD-10-CM | POA: Diagnosis not present

## 2023-08-20 DIAGNOSIS — S5012XA Contusion of left forearm, initial encounter: Secondary | ICD-10-CM | POA: Diagnosis not present

## 2023-08-20 DIAGNOSIS — I4891 Unspecified atrial fibrillation: Secondary | ICD-10-CM | POA: Diagnosis not present

## 2023-08-20 DIAGNOSIS — S20211A Contusion of right front wall of thorax, initial encounter: Secondary | ICD-10-CM | POA: Insufficient documentation

## 2023-08-20 DIAGNOSIS — S20212A Contusion of left front wall of thorax, initial encounter: Secondary | ICD-10-CM | POA: Diagnosis not present

## 2023-08-20 DIAGNOSIS — S0003XA Contusion of scalp, initial encounter: Secondary | ICD-10-CM | POA: Diagnosis not present

## 2023-08-20 DIAGNOSIS — S299XXA Unspecified injury of thorax, initial encounter: Secondary | ICD-10-CM | POA: Diagnosis not present

## 2023-08-20 DIAGNOSIS — S0990XA Unspecified injury of head, initial encounter: Secondary | ICD-10-CM | POA: Diagnosis not present

## 2023-08-20 DIAGNOSIS — M79632 Pain in left forearm: Secondary | ICD-10-CM | POA: Diagnosis not present

## 2023-08-20 DIAGNOSIS — S199XXA Unspecified injury of neck, initial encounter: Secondary | ICD-10-CM | POA: Diagnosis not present

## 2023-08-20 DIAGNOSIS — I2510 Atherosclerotic heart disease of native coronary artery without angina pectoris: Secondary | ICD-10-CM | POA: Insufficient documentation

## 2023-08-20 DIAGNOSIS — S40022A Contusion of left upper arm, initial encounter: Secondary | ICD-10-CM | POA: Diagnosis present

## 2023-08-20 DIAGNOSIS — S3991XA Unspecified injury of abdomen, initial encounter: Secondary | ICD-10-CM | POA: Diagnosis not present

## 2023-08-20 DIAGNOSIS — S3993XA Unspecified injury of pelvis, initial encounter: Secondary | ICD-10-CM | POA: Diagnosis not present

## 2023-08-20 LAB — CBC WITH DIFFERENTIAL/PLATELET
Abs Immature Granulocytes: 0.08 10*3/uL — ABNORMAL HIGH (ref 0.00–0.07)
Basophils Absolute: 0 10*3/uL (ref 0.0–0.1)
Basophils Relative: 0 %
Eosinophils Absolute: 0 10*3/uL (ref 0.0–0.5)
Eosinophils Relative: 0 %
HCT: 40.1 % (ref 39.0–52.0)
Hemoglobin: 13.3 g/dL (ref 13.0–17.0)
Immature Granulocytes: 1 %
Lymphocytes Relative: 14 %
Lymphs Abs: 1.1 10*3/uL (ref 0.7–4.0)
MCH: 25.8 pg — ABNORMAL LOW (ref 26.0–34.0)
MCHC: 33.2 g/dL (ref 30.0–36.0)
MCV: 77.9 fL — ABNORMAL LOW (ref 80.0–100.0)
Monocytes Absolute: 1.8 10*3/uL — ABNORMAL HIGH (ref 0.1–1.0)
Monocytes Relative: 21 %
Neutro Abs: 5.3 10*3/uL (ref 1.7–7.7)
Neutrophils Relative %: 64 %
Platelets: 115 10*3/uL — ABNORMAL LOW (ref 150–400)
RBC: 5.15 MIL/uL (ref 4.22–5.81)
RDW: 14.5 % (ref 11.5–15.5)
WBC: 8.4 10*3/uL (ref 4.0–10.5)
nRBC: 0 % (ref 0.0–0.2)

## 2023-08-20 LAB — CUP PACEART REMOTE DEVICE CHECK
Battery Remaining Longevity: 102 mo
Battery Remaining Percentage: 100 %
Brady Statistic RV Percent Paced: 23 %
Date Time Interrogation Session: 20250207001200
Implantable Lead Connection Status: 753985
Implantable Lead Implant Date: 20231106
Implantable Lead Location: 753860
Implantable Lead Model: 7842
Implantable Lead Serial Number: 1220619
Implantable Pulse Generator Implant Date: 20231106
Lead Channel Impedance Value: 617 Ohm
Lead Channel Pacing Threshold Amplitude: 1 V
Lead Channel Pacing Threshold Pulse Width: 0.4 ms
Lead Channel Setting Pacing Amplitude: 1.4 V
Lead Channel Setting Pacing Pulse Width: 0.4 ms
Lead Channel Setting Sensing Sensitivity: 2.5 mV
Pulse Gen Serial Number: 868568
Zone Setting Status: 755011

## 2023-08-20 LAB — BASIC METABOLIC PANEL
Anion gap: 6 (ref 5–15)
BUN: 19 mg/dL (ref 8–23)
CO2: 24 mmol/L (ref 22–32)
Calcium: 9.3 mg/dL (ref 8.9–10.3)
Chloride: 106 mmol/L (ref 98–111)
Creatinine, Ser: 1.05 mg/dL (ref 0.61–1.24)
GFR, Estimated: >60 mL/min (ref >60)
Glucose, Bld: 85 mg/dL (ref 70–99)
Potassium: 4.3 mmol/L (ref 3.5–5.1)
Sodium: 136 mmol/L (ref 135–145)

## 2023-08-20 MED ORDER — IOHEXOL 300 MG/ML  SOLN
100.0000 mL | Freq: Once | INTRAMUSCULAR | Status: AC | PRN
  Administered 2023-08-20: 100 mL via INTRAVENOUS
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
## 2023-08-24 ENCOUNTER — Other Ambulatory Visit: Payer: Self-pay

## 2023-08-24 MED ORDER — METOPROLOL SUCCINATE ER 25 MG PO TB24: 25.0000 mg | ORAL_TABLET | Freq: Every day | ORAL | 0 refills | Status: DC

## 2023-08-28 ENCOUNTER — Encounter: Payer: Self-pay | Admitting: Internal Medicine

## 2023-08-29 ENCOUNTER — Emergency Department (HOSPITAL_BASED_OUTPATIENT_CLINIC_OR_DEPARTMENT_OTHER): Payer: No Typology Code available for payment source

## 2023-08-29 ENCOUNTER — Encounter (HOSPITAL_BASED_OUTPATIENT_CLINIC_OR_DEPARTMENT_OTHER): Payer: Self-pay

## 2023-08-29 ENCOUNTER — Emergency Department (HOSPITAL_BASED_OUTPATIENT_CLINIC_OR_DEPARTMENT_OTHER)
Admission: EM | Admit: 2023-08-29 | Discharge: 2023-08-29 | Discharge status: Against Medical Advice | Payer: No Typology Code available for payment source | Attending: Emergency Medicine | Admitting: Emergency Medicine

## 2023-08-29 ENCOUNTER — Other Ambulatory Visit: Payer: Self-pay

## 2023-08-29 DIAGNOSIS — M79671 Pain in right foot: Secondary | ICD-10-CM | POA: Diagnosis not present

## 2023-08-29 DIAGNOSIS — M79604 Pain in right leg: Secondary | ICD-10-CM | POA: Insufficient documentation

## 2023-08-29 DIAGNOSIS — S99921A Unspecified injury of right foot, initial encounter: Secondary | ICD-10-CM | POA: Diagnosis not present

## 2023-08-29 DIAGNOSIS — M7989 Other specified soft tissue disorders: Secondary | ICD-10-CM | POA: Diagnosis not present

## 2023-08-29 DIAGNOSIS — M19071 Primary osteoarthritis, right ankle and foot: Secondary | ICD-10-CM | POA: Diagnosis not present

## 2023-08-29 DIAGNOSIS — Z5321 Procedure and treatment not carried out due to patient leaving prior to being seen by health care provider: Secondary | ICD-10-CM | POA: Diagnosis not present
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
## 2023-08-29 NOTE — ED Notes (Signed)
 Patient transported to Ultrasound

## 2023-08-30 DIAGNOSIS — H5213 Myopia, bilateral: Secondary | ICD-10-CM | POA: Diagnosis not present

## 2023-08-30 DIAGNOSIS — H2513 Age-related nuclear cataract, bilateral: Secondary | ICD-10-CM | POA: Diagnosis not present

## 2023-08-30 DIAGNOSIS — H40003 Preglaucoma, unspecified, bilateral: Secondary | ICD-10-CM | POA: Diagnosis not present

## 2023-08-30 DIAGNOSIS — H25011 Cortical age-related cataract, right eye: Secondary | ICD-10-CM | POA: Diagnosis not present

## 2023-08-30 DIAGNOSIS — H353131 Nonexudative age-related macular degeneration, bilateral, early dry stage: Secondary | ICD-10-CM | POA: Diagnosis not present

## 2023-08-30 DIAGNOSIS — H524 Presbyopia: Secondary | ICD-10-CM | POA: Diagnosis not present

## 2023-08-30 DIAGNOSIS — H02831 Dermatochalasis of right upper eyelid: Secondary | ICD-10-CM | POA: Diagnosis not present

## 2023-08-30 DIAGNOSIS — H52203 Unspecified astigmatism, bilateral: Secondary | ICD-10-CM | POA: Diagnosis not present

## 2023-08-30 DIAGNOSIS — H43813 Vitreous degeneration, bilateral: Secondary | ICD-10-CM | POA: Diagnosis not present

## 2023-09-14 DIAGNOSIS — L600 Ingrowing nail: Secondary | ICD-10-CM | POA: Diagnosis not present

## 2023-09-14 DIAGNOSIS — L03032 Cellulitis of left toe: Secondary | ICD-10-CM | POA: Diagnosis not present

## 2023-09-14 DIAGNOSIS — M79675 Pain in left toe(s): Secondary | ICD-10-CM | POA: Diagnosis not present

## 2023-09-19 ENCOUNTER — Other Ambulatory Visit: Payer: Self-pay | Admitting: Internal Medicine

## 2023-09-20 NOTE — Addendum Note (Signed)
 Addended by: Elease Etienne A on: 09/20/2023 01:23 PM   Modules accepted: Orders

## 2023-09-20 NOTE — Progress Notes (Signed)
 Remote pacemaker transmission.

## 2023-09-21 ENCOUNTER — Telehealth: Payer: Self-pay | Admitting: Internal Medicine

## 2023-09-21 MED ORDER — METOPROLOL SUCCINATE ER 25 MG PO TB24: 25.0000 mg | ORAL_TABLET | Freq: Every day | ORAL | 0 refills | Status: DC

## 2023-09-21 NOTE — Telephone Encounter (Signed)
*  STAT* If patient is at the pharmacy, call can be transferred to refill team.   1. Which medications need to be refilled? (please list name of each medication and dose if known)  metoprolol succinate (TOPROL-XL) 25 MG 24 hr tablet   2. Would you like to learn more about the convenience, safety, & potential cost savings by using the [facility]? no   3. Are you open to using the [facility] (Type [facility]. ).no   4. Which pharmacy/location (including street and city if local pharmacy) is medication to be sent to?[id number] - [place], [place] - [address]    5. Do they need a 30 day or 90 day supply? 90 day   Pt has sched appt on 5/19

## 2023-09-21 NOTE — Telephone Encounter (Signed)
 Pt's medication was sent to pt's pharmacy as requested. Confirmation received.

## 2023-09-26 ENCOUNTER — Telehealth: Payer: Self-pay | Admitting: Pharmacy Technician

## 2023-09-26 NOTE — Telephone Encounter (Signed)
 Pharmacy Patient Advocate Encounter   Received notification from Fax that prior authorization for Eliquis is required/requested.   Insurance verification completed.   The patient is insured through [employer] .   Per test claim: PA required; PA submitted to above mentioned insurance via Fax Key/confirmation #/EOC faxed Status is pending

## 2023-09-30 ENCOUNTER — Other Ambulatory Visit (HOSPITAL_COMMUNITY): Payer: Self-pay

## 2023-10-03 ENCOUNTER — Other Ambulatory Visit (HOSPITAL_COMMUNITY): Payer: Self-pay

## 2023-10-03 NOTE — Telephone Encounter (Signed)
 Pharmacy Patient Advocate Encounter  Received notification from [facility] that Prior Authorization for Eliquis has been APPROVED from 09/26/23 to 07/11/24   PA #/Case ID/Reference #: [id number]

## 2023-10-18 DIAGNOSIS — I10 Essential (primary) hypertension: Secondary | ICD-10-CM | POA: Diagnosis not present

## 2023-10-18 DIAGNOSIS — K4090 Unilateral inguinal hernia, without obstruction or gangrene, not specified as recurrent: Secondary | ICD-10-CM | POA: Diagnosis not present

## 2023-10-18 DIAGNOSIS — Z950 Presence of cardiac pacemaker: Secondary | ICD-10-CM | POA: Diagnosis not present

## 2023-10-18 DIAGNOSIS — G453 Amaurosis fugax: Secondary | ICD-10-CM | POA: Diagnosis not present

## 2023-10-18 DIAGNOSIS — D6869 Other thrombophilia: Secondary | ICD-10-CM | POA: Diagnosis not present

## 2023-10-18 DIAGNOSIS — E785 Hyperlipidemia, unspecified: Secondary | ICD-10-CM | POA: Diagnosis not present

## 2023-10-18 DIAGNOSIS — D696 Thrombocytopenia, unspecified: Secondary | ICD-10-CM | POA: Diagnosis not present

## 2023-10-18 DIAGNOSIS — I480 Paroxysmal atrial fibrillation: Secondary | ICD-10-CM | POA: Diagnosis not present

## 2023-10-18 DIAGNOSIS — E041 Nontoxic single thyroid nodule: Secondary | ICD-10-CM | POA: Diagnosis not present

## 2023-10-31 DIAGNOSIS — D3617 Benign neoplasm of peripheral nerves and autonomic nervous system of trunk, unspecified: Secondary | ICD-10-CM | POA: Diagnosis not present

## 2023-10-31 DIAGNOSIS — L570 Actinic keratosis: Secondary | ICD-10-CM | POA: Diagnosis not present

## 2023-10-31 DIAGNOSIS — Z85828 Personal history of other malignant neoplasm of skin: Secondary | ICD-10-CM | POA: Diagnosis not present

## 2023-10-31 DIAGNOSIS — Z129 Encounter for screening for malignant neoplasm, site unspecified: Secondary | ICD-10-CM | POA: Diagnosis not present

## 2023-10-31 DIAGNOSIS — D485 Neoplasm of uncertain behavior of skin: Secondary | ICD-10-CM | POA: Diagnosis not present

## 2023-10-31 DIAGNOSIS — L718 Other rosacea: Secondary | ICD-10-CM | POA: Diagnosis not present

## 2023-10-31 DIAGNOSIS — L738 Other specified follicular disorders: Secondary | ICD-10-CM | POA: Diagnosis not present

## 2023-11-18 ENCOUNTER — Ambulatory Visit (INDEPENDENT_AMBULATORY_CARE_PROVIDER_SITE_OTHER): Payer: Medicare PPO

## 2023-11-18 DIAGNOSIS — I442 Atrioventricular block, complete: Secondary | ICD-10-CM | POA: Diagnosis not present

## 2023-11-21 LAB — CUP PACEART REMOTE DEVICE CHECK
Battery Remaining Longevity: 96 mo
Battery Remaining Percentage: 100 %
Brady Statistic RV Percent Paced: 22 %
Date Time Interrogation Session: 20250509001200
Implantable Lead Connection Status: 753985
Implantable Lead Implant Date: 20231106
Implantable Lead Location: 753860
Implantable Lead Model: 7842
Implantable Lead Serial Number: 1220619
Implantable Pulse Generator Implant Date: 20231106
Lead Channel Impedance Value: 595 Ohm
Lead Channel Pacing Threshold Amplitude: 1.1 V
Lead Channel Pacing Threshold Pulse Width: 0.4 ms
Lead Channel Setting Pacing Amplitude: 1.6 V
Lead Channel Setting Pacing Pulse Width: 0.4 ms
Lead Channel Setting Sensing Sensitivity: 2.5 mV
Pulse Gen Serial Number: 868568
Zone Setting Status: 755011

## 2023-11-22 ENCOUNTER — Ambulatory Visit: Payer: Self-pay | Admitting: Internal Medicine

## 2023-11-28 ENCOUNTER — Ambulatory Visit: Admitting: Internal Medicine

## 2023-12-07 DIAGNOSIS — D696 Thrombocytopenia, unspecified: Secondary | ICD-10-CM | POA: Diagnosis not present

## 2023-12-23 NOTE — Addendum Note (Signed)
 Addended by: Lott Rouleau A on: 12/23/2023 10:49 AM   Modules accepted: Orders

## 2023-12-23 NOTE — Progress Notes (Signed)
 Remote pacemaker transmission.

## 2023-12-26 ENCOUNTER — Other Ambulatory Visit: Payer: Self-pay | Admitting: Cardiology

## 2023-12-26 DIAGNOSIS — I4821 Permanent atrial fibrillation: Secondary | ICD-10-CM

## 2023-12-26 NOTE — Telephone Encounter (Signed)
 Prescription refill request for Eliquis  received. Indication: Afib  Last office visit: 05/16/23 Renna Cary)  Scr: 1.05 (08/20/23)  Age: 78 Weight: 72.6kg  Appropriate dose. Refill sent.

## 2024-01-02 ENCOUNTER — Other Ambulatory Visit: Payer: Self-pay | Admitting: Internal Medicine

## 2024-01-23 ENCOUNTER — Ambulatory Visit: Admitting: Orthopaedic Surgery

## 2024-01-23 ENCOUNTER — Other Ambulatory Visit (INDEPENDENT_AMBULATORY_CARE_PROVIDER_SITE_OTHER): Payer: Self-pay

## 2024-01-23 ENCOUNTER — Encounter: Payer: Self-pay | Admitting: Orthopaedic Surgery

## 2024-01-23 DIAGNOSIS — G8929 Other chronic pain: Secondary | ICD-10-CM

## 2024-01-23 DIAGNOSIS — M25552 Pain in left hip: Secondary | ICD-10-CM

## 2024-01-23 DIAGNOSIS — M25562 Pain in left knee: Secondary | ICD-10-CM

## 2024-01-23 MED ORDER — METHYLPREDNISOLONE ACETATE 40 MG/ML IJ SUSP
40.0000 mg | INTRAMUSCULAR | Status: AC | PRN
  Administered 2024-01-23: 40 mg via INTRA_ARTICULAR

## 2024-01-23 MED ORDER — LIDOCAINE HCL 1 % IJ SOLN
3.0000 mL | INTRAMUSCULAR | Status: AC | PRN
  Administered 2024-01-23: 3 mL

## 2024-01-23 NOTE — Progress Notes (Signed)
 Although the patient is listed as a new patient, we have seen him in the past but has been quite sometime.  He is 78 years old and comes in today mainly with left knee pain but also left hip pain however he does point the sciatic area the source of his pain on the left side.  He has had a steroid injection in the left knee before with a diagnosis of meniscal tear.  He said the left knee injection did help for a while.  He says when he first gets up from a sitting position the knee is stiff and it does hurt.  He is also been having some sciatic type of symptoms when he first gets up.  He denies any groin pain.  He is on Eliquis  for chronic heart issues.  Examination of his left knee today shows no effusion.  There is patellofemoral capitation.  The knee has neutral alignment.  He has good range of motion of the knee and certainly posterior medial pain consistent more with a meniscal tear that is chronic.  I did not palpate a Baker's cyst.  His left hip moves smoothly and fluidly no blocks or rotation.  He does have a positive straight leg raise on the left side.  He does have pain with extension of the lumbar spine as well.  2 views of left knee show patellofemoral arthritic changes but well-maintained medial and lateral compartments and no effusion.  An AP pelvis and lateral of the left hip shows normal-appearing hips bilaterally.  I was able to pull up the CT scan that shows his abdomen and pelvis and I can see his lumbar spine from earlier this year and he does have a significant spondylolisthesis between L5 and S1.  This likely the source of his sciatic symptoms to the left side.  Per his request I did place a steroid injection in his left knee which he tolerated well.  I told him we are happy to explore the spine further if he is having enough issues but he said right now he will watch and see.  If he does come in again with issues as it relates to sciatica or hip pain we would definitely need standing AP  and lateral of his lumbar spine.  We would then go from there.  All questions and concerns were answered and addressed.    Procedure Note  Patient: Brian Klein             Date of Birth: 1946/06/22           MRN: [id number]             Visit Date: 01/23/2024  Procedures: Visit Diagnoses:  1. Chronic pain of left knee   2. Pain of left hip     Large Joint Inj: L knee on 01/23/2024 1:32 PM Indications: diagnostic evaluation and pain Details: 22 G 1.5 in needle, superolateral approach  Arthrogram: No  Medications: 3 mL lidocaine  1 %; 40 mg methylPREDNISolone  acetate 40 MG/ML Outcome: tolerated well, no immediate complications Procedure, treatment alternatives, risks and benefits explained, specific risks discussed. Consent was given by the patient. Immediately prior to procedure a time out was called to verify the correct patient, procedure, equipment, support staff and site/side marked as required. Patient was prepped and draped in the usual sterile fashion.

## 2024-01-30 ENCOUNTER — Encounter: Payer: Self-pay | Admitting: Internal Medicine

## 2024-01-30 ENCOUNTER — Ambulatory Visit: Attending: Internal Medicine | Admitting: Internal Medicine

## 2024-01-30 VITALS — BP 90/64 | HR 86 | Ht 66.0 in | Wt 159.0 lb

## 2024-01-30 DIAGNOSIS — I4819 Other persistent atrial fibrillation: Secondary | ICD-10-CM

## 2024-01-30 DIAGNOSIS — I442 Atrioventricular block, complete: Secondary | ICD-10-CM | POA: Diagnosis not present

## 2024-01-30 LAB — CUP PACEART INCLINIC DEVICE CHECK
Date Time Interrogation Session: 20250721163942
Implantable Lead Connection Status: 753985
Implantable Lead Implant Date: 20231106
Implantable Lead Location: 753860
Implantable Lead Model: 7842
Implantable Lead Serial Number: 1220619
Implantable Pulse Generator Implant Date: 20231106
Lead Channel Impedance Value: 591 Ohm
Lead Channel Pacing Threshold Amplitude: 0.5 V
Lead Channel Pacing Threshold Amplitude: 1 V
Lead Channel Pacing Threshold Pulse Width: 0.4 ms
Lead Channel Pacing Threshold Pulse Width: 0.4 ms
Lead Channel Sensing Intrinsic Amplitude: 12.3 mV
Lead Channel Setting Pacing Amplitude: 1.4 V
Lead Channel Setting Pacing Pulse Width: 0.4 ms
Lead Channel Setting Sensing Sensitivity: 2.5 mV
Pulse Gen Serial Number: 868568
Zone Setting Status: 755011

## 2024-01-30 NOTE — Patient Instructions (Signed)

## 2024-01-30 NOTE — Progress Notes (Signed)
 HPI [name] returns today for followup. He is a pleasant 78 yo man with a h/o CHB after TAVR. In the interim, he has been stable with no chest pain or sob. He does have some fatigue and will have episodes of near syncope which are better but not gone since his PPM. He clearly has evidence of autonomic dysfunction. His bp has been controlled. He has chronic atrial fib. He is asymptomatic and remains on eliquis .  Allergies  Allergen Reactions   Crestor [Rosuvastatin] Other (See Comments)    Legs aches and pain      Current Outpatient Medications  Medication Sig Dispense Refill   acetaminophen  (TYLENOL ) 500 MG tablet Take 1,000 mg by mouth daily as needed (pain).     amLODipine  (NORVASC ) 5 MG tablet Take 5 mg by mouth daily.     amoxicillin  (AMOXIL ) 500 MG capsule TAKE FOUR CAPSULES BY MOUTH 1 HOUR BEFORE DENTAL APPOINTMENT 4 capsule 3   BELSOMRA  20 MG TABS Take 20 mg by mouth at bedtime.     Cholecalciferol (VITAMIN D) 2000 UNITS tablet Take 2,000 Units by mouth daily.     Coenzyme Q10 (CO Q 10) 100 MG CAPS Take 100 mg by mouth daily.     ELIQUIS  5 MG TABS tablet TAKE 1 TABLET(5 MG) BY MOUTH TWICE DAILY 180 tablet 1   ezetimibe -simvastatin  (VYTORIN ) 10-40 MG per tablet Take 1 tablet by mouth daily.     fish oil-omega-3 fatty acids 1000 MG capsule Take 1 g by mouth daily.     fluticasone  (FLONASE ) 50 MCG/ACT nasal spray Place 1 spray into both nostrils daily as needed for allergies or rhinitis.      metoprolol  succinate (TOPROL -XL) 25 MG 24 hr tablet TAKE 1 TABLET(25 MG) BY MOUTH DAILY 90 tablet 1   metroNIDAZOLE (METROGEL) 0.75 % gel Apply 1 Application topically 2 (two) times daily as needed (rosacea).     olmesartan (BENICAR) 20 MG tablet Take 20 mg by mouth daily.     No current facility-administered medications for this visit.     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 Wendel & Dr. Murriel   Seasonal allergies 11/19/2014    ROS:   All systems reviewed and negative except as noted in the HPI.   Past Surgical History:  Procedure Laterality Date   COLONOSCOPY  04/14/2023   EPIGASTRIC HERNIA REPAIR N/A 05/19/2020   Procedure: OPEN EPIGASTRIC HERNIA REPAIR WITH MESH;  Surgeon: Kinsinger, Herlene Righter, MD;  Location: [facility];  Service: General;  Laterality: N/A;   EYELID LACERATION REPAIR  07/12/2006   left and right   INTRAOPERATIVE TRANSTHORACIC ECHOCARDIOGRAM N/A 05/11/2022   Procedure: INTRAOPERATIVE TRANSTHORACIC ECHOCARDIOGRAM;  Surgeon: Wendel Lurena POUR, MD;  Location: [facility];  Service: Open Heart Surgery;  Laterality: N/A;   MANDIBLE SURGERY  07/12/1974   right removal -- cancer   MANDIBLE SURGERY  07/12/1985   right  reconstruction   PACEMAKER IMPLANT N/A 05/17/2022   Procedure: PACEMAKER IMPLANT;  Surgeon: Waddell Danelle ORN, MD;  Location: [facility];  Service: Cardiovascular;  Laterality: N/A;   RIGHT/LEFT HEART CATH AND CORONARY ANGIOGRAPHY N/A 03/26/2022   Procedure: RIGHT/LEFT HEART CATH AND CORONARY ANGIOGRAPHY;  Surgeon: Wendel Lurena POUR, MD;  Location: [facility];  Service: Cardiovascular;  Laterality: N/A;   TRANSCATHETER AORTIC VALVE REPLACEMENT, TRANSFEMORAL N/A 05/11/2022   Procedure: Transcatheter Aortic Valve Replacement, Transfemoral;  Surgeon: Thukkani, Arun K, MD;  Location: [facility];  Service: Open Heart Surgery;  Laterality: N/A;   TRANSTHORACIC ECHOCARDIOGRAM  07/18/2002   ef 71%      Family History  Problem Relation Age of Onset   Stroke Mother    Heart attack Father    Hypertension Father    Hypertension Sister    Colon cancer Neg Hx    Stomach cancer Neg Hx    Rectal cancer Neg Hx    Esophageal cancer Neg Hx      Social History   Socioeconomic History   Marital status: Married    Spouse name: Brian Klein   Number of children: 3   Years of education: 18   Highest education level: Not on file  Occupational History   Occupation: retired   Occupation: retired  Tobacco Use   Smoking status: Never   Smokeless tobacco: Never  Vaping Use   Vaping status: Never Used  Substance and Sexual Activity   Alcohol use: No   Drug use: No   Sexual activity: Not on file  Other Topics Concern   Not on file  Social History Narrative   Right handed   Drinks caffeine, tea   One level   Retired   Lives with wife   Social Drivers of Corporate investment banker Strain: Not on file  Food Insecurity: Not on file  Transportation Needs: Not on file  Physical Activity: Not on file  Stress: Not on file  Social Connections: Not on file  Intimate Partner Violence: Not on file     BP 90/64   Pulse 86   Ht 5' 6 (1.676 m)   Wt 159 lb (72.1 kg)   SpO2 96%   BMI 25.66 kg/m   Physical Exam:  Well appearing NAD HEENT: Unremarkable Neck:  No JVD, no thyromegally Lymphatics:  No adenopathy Back:  No CVA tenderness Lungs:  Clear with no wheezes HEART:  IRegular rate rhythm, no murmurs, no rubs, no clicks Abd:  soft, positive bowel sounds, no organomegally, no rebound, no guarding Ext:  2 plus pulses, no edema, no cyanosis, no clubbing Skin:  No rashes no nodules Neuro:  CN II through XII intact, motor grossly intact  DEVICE  Normal device function.  See PaceArt for details.   Assess/Plan:  Heart block - he is conducting in atrial fib with a controlled VR PPM - his BS single chamber PM is working normally. He has about 8 years on the battery. AS - he is  asymptomatic s/p TAVR.  Danelle Jamien Casanova,MD

## 2024-02-08 DIAGNOSIS — M1712 Unilateral primary osteoarthritis, left knee: Secondary | ICD-10-CM | POA: Diagnosis not present

## 2024-02-08 DIAGNOSIS — I480 Paroxysmal atrial fibrillation: Secondary | ICD-10-CM | POA: Diagnosis not present

## 2024-02-08 DIAGNOSIS — M25562 Pain in left knee: Secondary | ICD-10-CM | POA: Diagnosis not present

## 2024-02-17 ENCOUNTER — Ambulatory Visit: Payer: Medicare PPO

## 2024-02-17 DIAGNOSIS — I442 Atrioventricular block, complete: Secondary | ICD-10-CM | POA: Diagnosis not present

## 2024-02-22 ENCOUNTER — Ambulatory Visit: Admitting: Orthopaedic Surgery

## 2024-02-22 ENCOUNTER — Other Ambulatory Visit (INDEPENDENT_AMBULATORY_CARE_PROVIDER_SITE_OTHER): Payer: Self-pay

## 2024-02-22 ENCOUNTER — Encounter: Payer: Self-pay | Admitting: Orthopaedic Surgery

## 2024-02-22 DIAGNOSIS — M79605 Pain in left leg: Secondary | ICD-10-CM

## 2024-02-22 LAB — CUP PACEART REMOTE DEVICE CHECK
Battery Remaining Longevity: 96 mo
Battery Remaining Percentage: 100 %
Brady Statistic RV Percent Paced: 14 %
Date Time Interrogation Session: 20250812204500
Implantable Lead Connection Status: 753985
Implantable Lead Implant Date: 20231106
Implantable Lead Location: 753860
Implantable Lead Model: 7842
Implantable Lead Serial Number: 1220619
Implantable Pulse Generator Implant Date: 20231106
Lead Channel Impedance Value: 599 Ohm
Lead Channel Pacing Threshold Amplitude: 1 V
Lead Channel Pacing Threshold Pulse Width: 0.4 ms
Lead Channel Setting Pacing Amplitude: 1.5 V
Lead Channel Setting Pacing Pulse Width: 0.4 ms
Lead Channel Setting Sensing Sensitivity: 2.5 mV
Pulse Gen Serial Number: 868568
Zone Setting Status: 755011

## 2024-02-22 NOTE — Progress Notes (Signed)
 The patient is an 78 year old gentleman that worsening in follow-up as a relates to significant left knee pain.  A steroid injection did help temporize his pain for only a few days.  Most of his pain is in the back of the left knee does radiate into the left IT band.  When we saw him at his last visit plain films of the pelvis and left hip showed a well-maintained left hip joint space.  X-rays of the left knee surprisingly showed well-maintained joint space.  We did x-ray his lumbar spine today.  The x-ray of the lumbar spine shows a chronic grade 2 spondylolisthesis at L5-S1.  I was able to see his CT scan of his abdomen pelvis back from 2017 that showed the same grade 2 spondylolisthesis that has not changed.  On exam today his left knee still hurts him quite a bit with no effusion.  It is stiff with flexion and extension and it is causing some IT band pain.  He has a negative straight leg raise and no numbness and tingling within his left leg and no weakness.  This point a MRI is warranted of his left knee to assess the cartilage and assess for meniscal tear given his continued posterior lateral knee pain.  He does have a pacemaker and that he is able to have a special MRI that is done at [facility].  Hopefully we can get this scheduled for him and then see him back in follow-up.  He agrees with this treatment plan.

## 2024-02-23 ENCOUNTER — Other Ambulatory Visit: Payer: Self-pay

## 2024-02-23 ENCOUNTER — Ambulatory Visit: Payer: Self-pay | Admitting: Internal Medicine

## 2024-02-23 DIAGNOSIS — G8929 Other chronic pain: Secondary | ICD-10-CM

## 2024-03-06 DIAGNOSIS — H40003 Preglaucoma, unspecified, bilateral: Secondary | ICD-10-CM | POA: Diagnosis not present

## 2024-03-06 DIAGNOSIS — H353131 Nonexudative age-related macular degeneration, bilateral, early dry stage: Secondary | ICD-10-CM | POA: Diagnosis not present

## 2024-04-06 NOTE — Progress Notes (Signed)
 Remote PPM Transmission

## 2024-04-09 ENCOUNTER — Other Ambulatory Visit: Payer: Self-pay | Admitting: Cardiology

## 2024-04-11 ENCOUNTER — Other Ambulatory Visit: Payer: Self-pay | Admitting: Cardiology

## 2024-04-18 DIAGNOSIS — Z85828 Personal history of other malignant neoplasm of skin: Secondary | ICD-10-CM | POA: Diagnosis not present

## 2024-04-18 DIAGNOSIS — L570 Actinic keratosis: Secondary | ICD-10-CM | POA: Diagnosis not present

## 2024-04-18 DIAGNOSIS — L218 Other seborrheic dermatitis: Secondary | ICD-10-CM | POA: Diagnosis not present

## 2024-04-18 DIAGNOSIS — W908XXS Exposure to other nonionizing radiation, sequela: Secondary | ICD-10-CM | POA: Diagnosis not present

## 2024-04-18 DIAGNOSIS — L578 Other skin changes due to chronic exposure to nonionizing radiation: Secondary | ICD-10-CM | POA: Diagnosis not present

## 2024-05-14 ENCOUNTER — Encounter: Payer: Self-pay | Admitting: Radiology

## 2024-05-18 ENCOUNTER — Ambulatory Visit: Payer: Medicare PPO

## 2024-05-18 DIAGNOSIS — I442 Atrioventricular block, complete: Secondary | ICD-10-CM | POA: Diagnosis not present

## 2024-05-20 LAB — CUP PACEART REMOTE DEVICE CHECK
Battery Remaining Longevity: 90 mo
Battery Remaining Percentage: 100 %
Brady Statistic RV Percent Paced: 16 %
Date Time Interrogation Session: 20251107001100
Implantable Lead Connection Status: 753985
Implantable Lead Implant Date: 20231106
Implantable Lead Location: 753860
Implantable Lead Model: 7842
Implantable Lead Serial Number: 1220619
Implantable Pulse Generator Implant Date: 20231106
Lead Channel Impedance Value: 613 Ohm
Lead Channel Pacing Threshold Amplitude: 1 V
Lead Channel Pacing Threshold Pulse Width: 0.4 ms
Lead Channel Setting Pacing Amplitude: 1.5 V
Lead Channel Setting Pacing Pulse Width: 0.4 ms
Lead Channel Setting Sensing Sensitivity: 2.5 mV
Pulse Gen Serial Number: 868568
Zone Setting Status: 755011

## 2024-05-21 ENCOUNTER — Ambulatory Visit: Payer: Self-pay | Admitting: Internal Medicine

## 2024-05-22 NOTE — Progress Notes (Signed)
 Remote PPM Transmission

## 2024-06-01 DIAGNOSIS — E785 Hyperlipidemia, unspecified: Secondary | ICD-10-CM | POA: Diagnosis not present

## 2024-06-14 DIAGNOSIS — R82998 Other abnormal findings in urine: Secondary | ICD-10-CM | POA: Diagnosis not present

## 2024-06-14 DIAGNOSIS — R718 Other abnormality of red blood cells: Secondary | ICD-10-CM | POA: Diagnosis not present

## 2024-06-14 DIAGNOSIS — I10 Essential (primary) hypertension: Secondary | ICD-10-CM | POA: Diagnosis not present

## 2024-06-25 ENCOUNTER — Other Ambulatory Visit: Payer: Self-pay | Admitting: Internal Medicine

## 2024-06-30 ENCOUNTER — Other Ambulatory Visit: Payer: Self-pay | Admitting: Cardiology

## 2024-06-30 DIAGNOSIS — I4821 Permanent atrial fibrillation: Secondary | ICD-10-CM

## 2024-07-02 NOTE — Telephone Encounter (Signed)
 Prescription refill request for Eliquis  received. Indication: A-Fib Last office visit: 05/16/23 No F/U Scheduled Scr: 1.05 08/20/23 Care Everywhere Age: 78   Weight: 72.1 KG  No Recent Labs. Pt overdue for a F/U Pt has passed 3 of the guidelines for this Dose.

## 2024-08-17 ENCOUNTER — Ambulatory Visit

## 2024-11-16 ENCOUNTER — Ambulatory Visit

## 2025-02-15 ENCOUNTER — Ambulatory Visit

## 2025-05-17 ENCOUNTER — Ambulatory Visit
